# Patient Record
Sex: Male | Born: 1950
Health system: Southern US, Community
[De-identification: ages and names within clinical notes are randomized; demographics above are authoritative.]

## PROBLEM LIST (undated history)

## (undated) DIAGNOSIS — Z87891 Personal history of nicotine dependence: Secondary | ICD-10-CM

## (undated) DIAGNOSIS — Z8546 Personal history of malignant neoplasm of prostate: Secondary | ICD-10-CM

## (undated) DIAGNOSIS — J449 Chronic obstructive pulmonary disease, unspecified: Secondary | ICD-10-CM

## (undated) DIAGNOSIS — G4730 Sleep apnea, unspecified: Secondary | ICD-10-CM

## (undated) DIAGNOSIS — E785 Hyperlipidemia, unspecified: Secondary | ICD-10-CM

## (undated) DIAGNOSIS — I10 Essential (primary) hypertension: Secondary | ICD-10-CM

## (undated) DIAGNOSIS — R635 Abnormal weight gain: Secondary | ICD-10-CM

## (undated) DIAGNOSIS — C61 Malignant neoplasm of prostate: Secondary | ICD-10-CM

## (undated) DIAGNOSIS — M1990 Unspecified osteoarthritis, unspecified site: Secondary | ICD-10-CM

## (undated) DIAGNOSIS — E7800 Pure hypercholesterolemia, unspecified: Secondary | ICD-10-CM

## (undated) DIAGNOSIS — I129 Hypertensive chronic kidney disease with stage 1 through stage 4 chronic kidney disease, or unspecified chronic kidney disease: Secondary | ICD-10-CM

## (undated) HISTORY — DX: Essential (primary) hypertension: I10

## (undated) HISTORY — PX: PROSTATE SURGERY: SHX751

## (undated) HISTORY — DX: Unspecified osteoarthritis, unspecified site: M19.90

## (undated) HISTORY — DX: Abnormal weight gain: R63.5

## (undated) HISTORY — DX: Personal history of malignant neoplasm of prostate: Z85.46

## (undated) HISTORY — DX: Chronic obstructive pulmonary disease, unspecified: J44.9

## (undated) HISTORY — DX: Pure hypercholesterolemia, unspecified: E78.00

## (undated) HISTORY — DX: Malignant neoplasm of prostate: C61

## (undated) HISTORY — DX: Sleep apnea, unspecified: G47.30

## (undated) HISTORY — DX: Personal history of nicotine dependence: Z87.891

## (undated) HISTORY — DX: Hypertensive chronic kidney disease with stage 1 through stage 4 chronic kidney disease, or unspecified chronic kidney disease: I12.9

## (undated) HISTORY — DX: Hyperlipidemia, unspecified: E78.5

---

## 2004-12-11 ENCOUNTER — Ambulatory Visit: Payer: Self-pay | Admitting: Urology

## 2004-12-30 ENCOUNTER — Ambulatory Visit: Payer: Self-pay | Admitting: Oncology

## 2004-12-31 ENCOUNTER — Ambulatory Visit: Payer: Self-pay | Admitting: Urology

## 2005-01-13 ENCOUNTER — Ambulatory Visit: Payer: Self-pay | Admitting: Oncology

## 2005-02-13 ENCOUNTER — Ambulatory Visit: Payer: Self-pay | Admitting: Oncology

## 2005-03-15 ENCOUNTER — Ambulatory Visit: Payer: Self-pay | Admitting: Oncology

## 2005-04-15 ENCOUNTER — Ambulatory Visit: Payer: Self-pay | Admitting: Oncology

## 2005-07-09 ENCOUNTER — Ambulatory Visit: Payer: Self-pay | Admitting: Oncology

## 2005-07-16 ENCOUNTER — Ambulatory Visit: Payer: Self-pay | Admitting: Oncology

## 2005-11-06 ENCOUNTER — Ambulatory Visit: Payer: Self-pay | Admitting: Radiation Oncology

## 2005-11-14 ENCOUNTER — Ambulatory Visit: Payer: Self-pay | Admitting: Oncology

## 2006-01-05 ENCOUNTER — Ambulatory Visit: Payer: Self-pay | Admitting: Oncology

## 2006-07-07 ENCOUNTER — Ambulatory Visit: Payer: Self-pay | Admitting: Oncology

## 2006-11-12 ENCOUNTER — Ambulatory Visit: Payer: Self-pay | Admitting: Radiation Oncology

## 2006-11-17 IMAGING — NM NUCLEAR MEDICINE WHOLE BODY BONE SCINTIGRAPHY
2 series · 4 of 4 positions shown · non-contrast
Comparison: none

REASON FOR EXAM: prostate ca  S/P radical prostatectomy with rising CEA
NM at [DATE]
COMMENTS:

[Series 1: 3 hr wholebody · 2.40mm/px · 2 of 2 frames shown]
[frame 1/2]
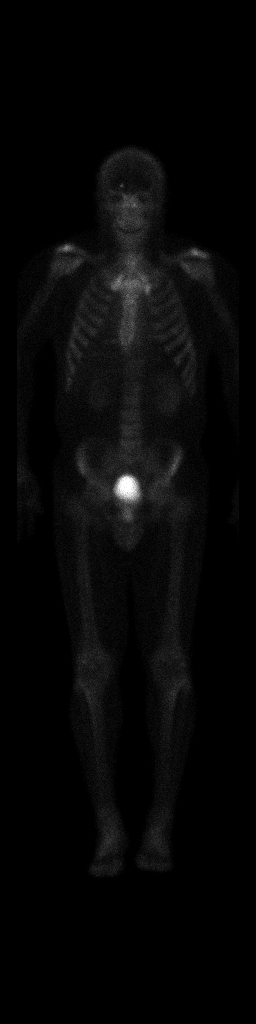
[frame 2/2]
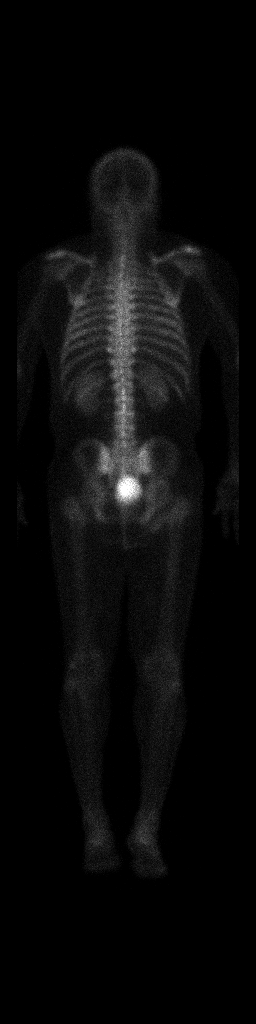

[Series 1: bone statics · 2.40mm/px · 2 of 2 frames shown]
[frame 1/2]
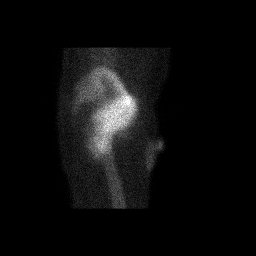
[frame 2/2]
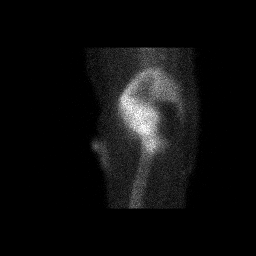

[4 of 4 positions shown; findings below may reference images not displayed]

PROCEDURE:     NM  - NM BONE WB 3 HR [DATE]  [DATE]

RESULT:       After injection of 22.39 mCi Tc 99m MDP, anterior and
posterior total body gamma scintiphotos were obtained and compared to a
prior study of 06/20/99.  The bone scan appears within normal limits as seen
on the previous study.  There is a tiny focal area of increased tracer noted
in the RIGHT frontal bone less apparent than on the previous exam.  Again
may be related to the frontal sinus.  No focal area of increase is noted to
suggest a metastatic lesion.
IMPRESSION: Bone Scan within normal limits.

## 2006-11-17 IMAGING — CT CT ABDOMEN AND PELVIS WITHOUT AND WITH CONTRAST
2 of 4 series · 13 of 32 positions shown, 18 images · non-contrast
Comparison: none

REASON FOR EXAM: prostate ca  S/P radical prostatectomy with rising CEA
NM at [DATE]
COMMENTS:

[Series 4: with · axial · 0.71mm/px · z∈[-486,-134]mm · 8 of 58 slices shown, 13 images]
[im 7/58  soft-tissue]
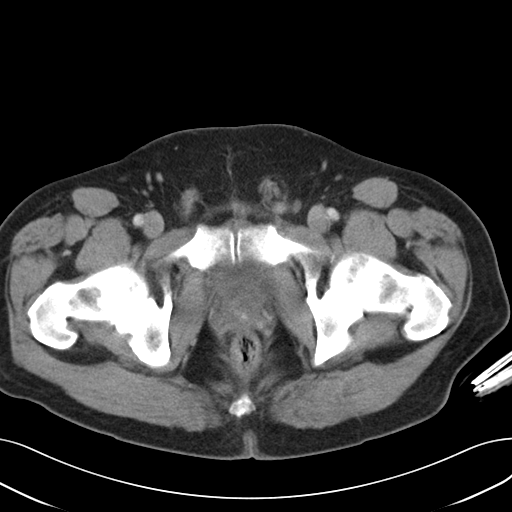
[im 7/58  bone]
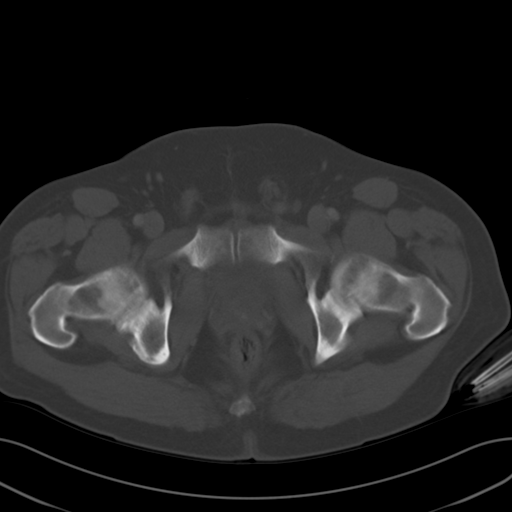
[im 13/58  soft-tissue]
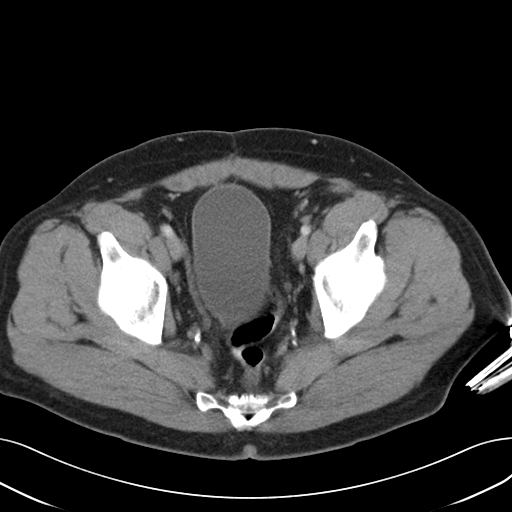
[im 20/58  soft-tissue]
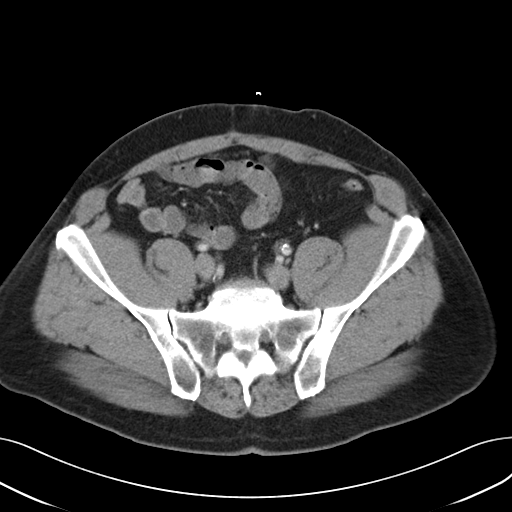
[im 26/58  soft-tissue]
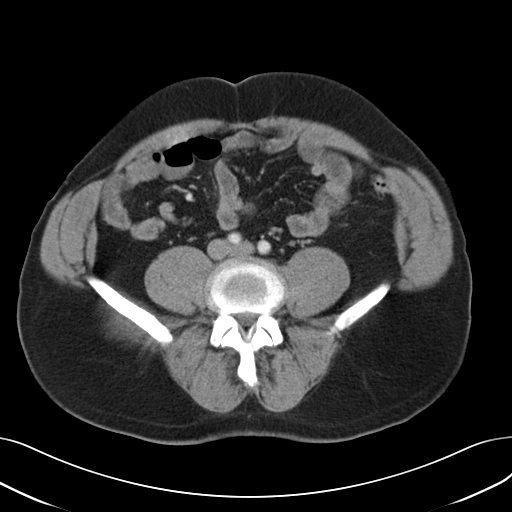
[im 32/58  soft-tissue]
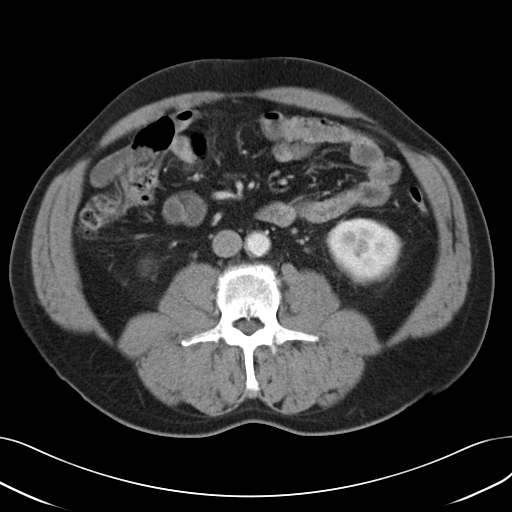
[im 32/58  lung]
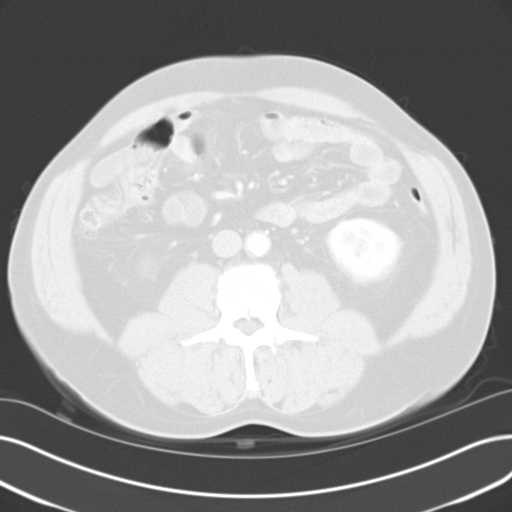
[im 39/58  soft-tissue]
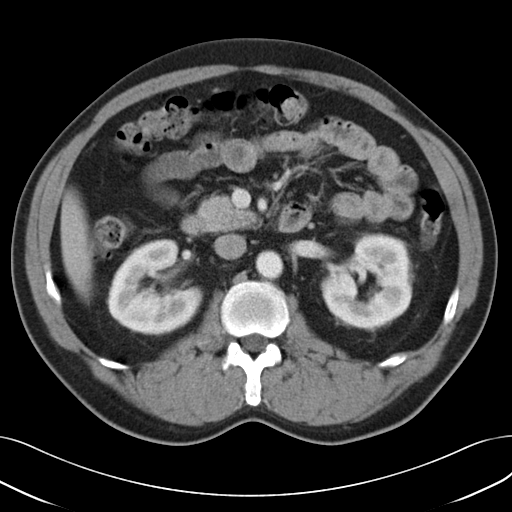
[im 39/58  lung]
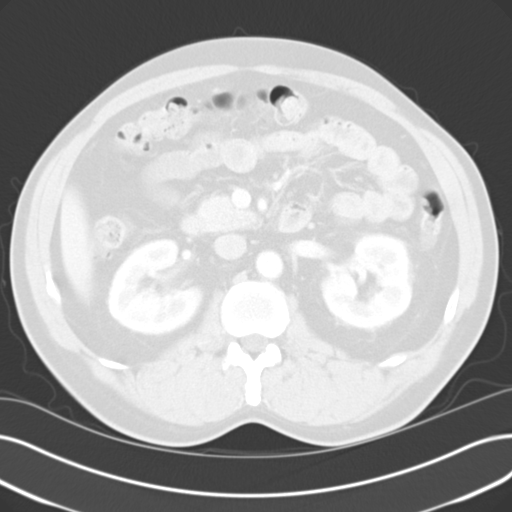
[im 45/58  soft-tissue]
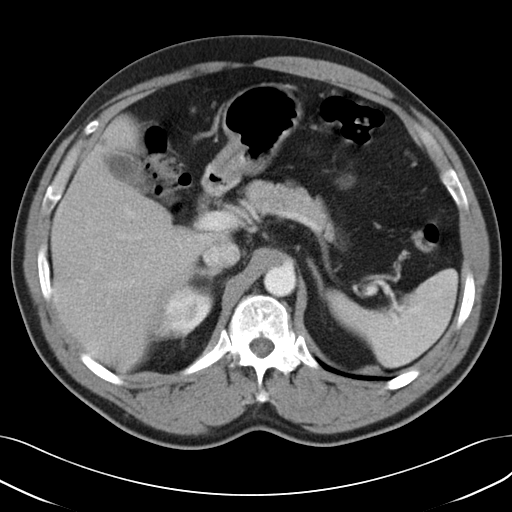
[im 45/58  lung]
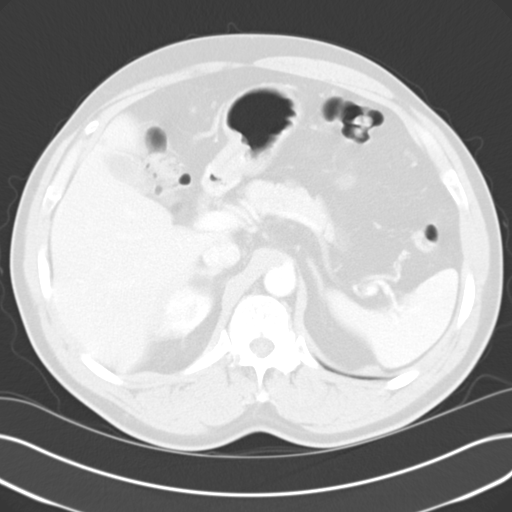
[im 51/58  soft-tissue]
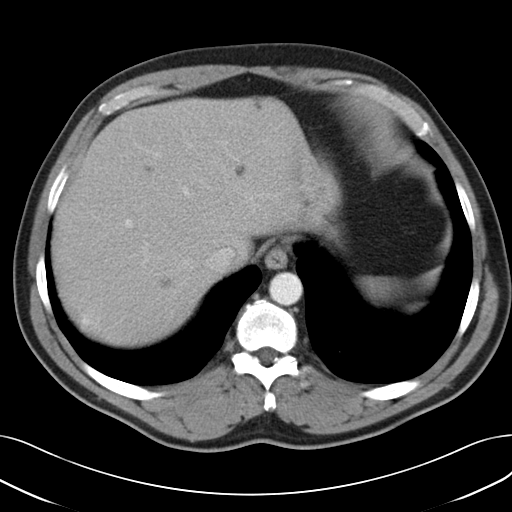
[im 51/58  lung]
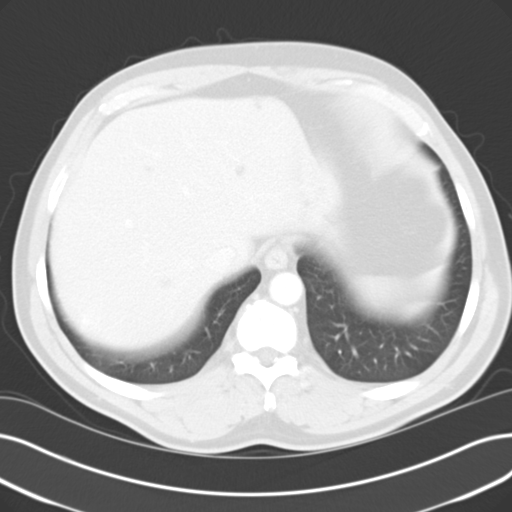

[Series 6: delay · axial · delayed · 0.71mm/px · z∈[-486,-286]mm · 5 of 58 slices shown]
[im 7/58  soft-tissue]
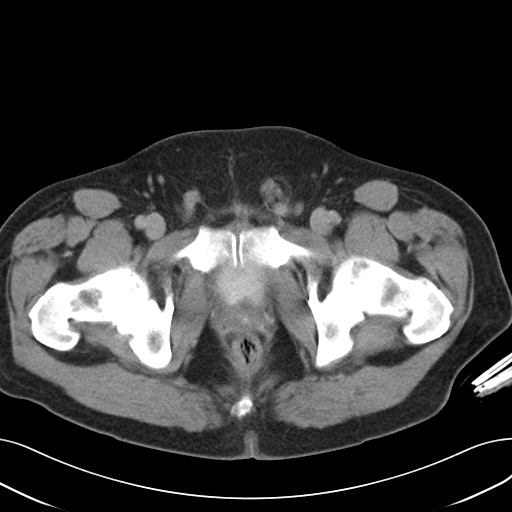
[im 13/58  soft-tissue]
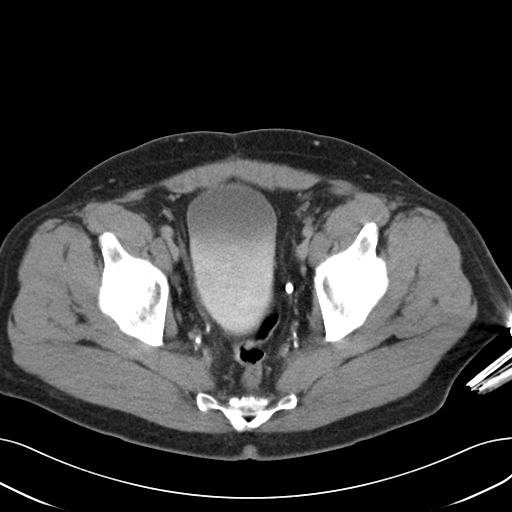
[im 20/58  soft-tissue]
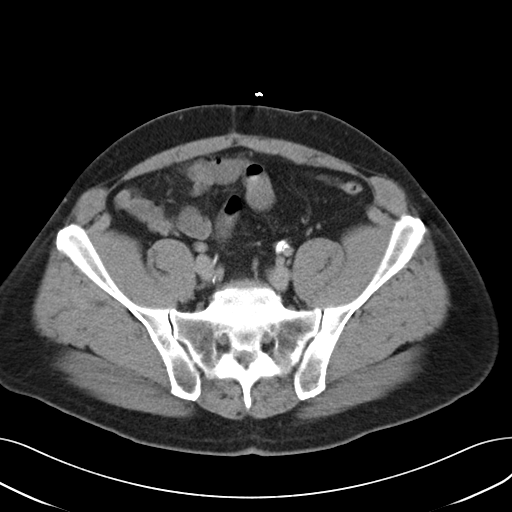
[im 26/58  soft-tissue]
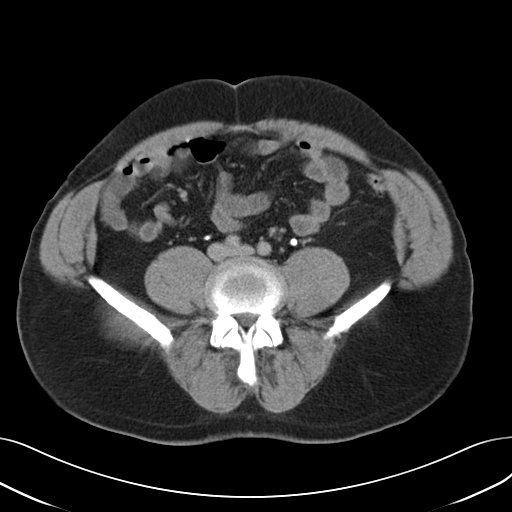
[im 32/58  soft-tissue]
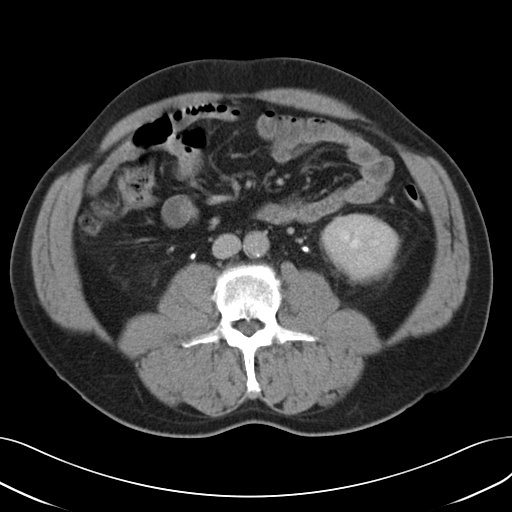

[13 of 32 positions shown; findings below may reference images not displayed]

PROCEDURE:     CT  - CT ABDOMEN / PELVIS  W/WO  - December 11, 2004  [DATE]

RESULT:     The patient has a history of radical prostatectomy, but has a
rising CEA value.

The non-contrast images reveal the LEFT kidney to be slightly larger than
the RIGHT and its interface with the perinephric fat is slightly blurred.
This may reflect low-grade inflammation or low-grade obstruction, but no
hydronephrosis is identified on these non-contrast images. On the subsequent
contrast enhanced images the enhancement pattern of the renal parenchyma is
normal and symmetric bilaterally. On the delayed images the appearance of
the contrast material within the intrarenal collecting systems appears
normal. There is abundant renal sinus fat.  The ureters are fractionally
visualized, but appear normal in course and caliber. The partially distended
urinary bladder is grossly normal. I see no free fluid in the pelvis nor
evidence of pelvic lymphadenopathy.  The sigmoid colon is grossly normal
though non-contrast filled.

The liver exhibits numerous tiny hypodensities on all three imaging
sequences. These measure 1 cm in diameter or less. The Hounsfield value of a
LEFT lobe lesion measures 8 HU prior to contrast rising to 35 on the
post-contrast image and measuring 25 on the delayed images.  This is not
classic for a simple cyst.  There is no intrahepatic ductal dilation.  The
non-distended stomach, spleen, adrenal glands, and pancreas are normal in
appearance. The abdominal aorta exhibits no evidence of an aneurysm. There
is calcification in its wall.  The unopacified loops of small and large
bowel exhibit no evidence of obstruction and no definite mass is identified.
IMPRESSION: 1)I do not see evidence of urinary tract lesions.

2)There are hypodensities noted in both the RIGHT and LEFT lobes of the
liver which may reflect cysts, but their Hounsfield values following
enhancement are not classic for that of simple cysts. The possibility of
metastatic disease is not excluded.

3)I do not see evidence of acute bowel abnormality on this study. If the
patient's PSA is rising then certainly the possibility of metastatic
prostate malignancy but be considered. Consideration for performing a
nuclear bone scan could be given.  Alternatively, if it is the patient's CEA
that has risen, then a gastrointestinal type malignancy is certainly high on
the differential list. The clinical history provided indicated that the CEA
was rising.

## 2007-01-14 ENCOUNTER — Ambulatory Visit: Payer: Self-pay | Admitting: Oncology

## 2007-01-28 ENCOUNTER — Ambulatory Visit: Payer: Self-pay | Admitting: Oncology

## 2007-02-03 ENCOUNTER — Ambulatory Visit: Payer: Self-pay | Admitting: Oncology

## 2007-02-14 ENCOUNTER — Ambulatory Visit: Payer: Self-pay | Admitting: Oncology

## 2007-09-07 ENCOUNTER — Other Ambulatory Visit: Payer: Self-pay

## 2007-09-07 ENCOUNTER — Emergency Department: Payer: Self-pay | Admitting: Emergency Medicine

## 2007-11-14 ENCOUNTER — Ambulatory Visit: Payer: Self-pay | Admitting: Radiation Oncology

## 2007-11-18 ENCOUNTER — Ambulatory Visit: Payer: Self-pay | Admitting: Radiation Oncology

## 2007-12-15 ENCOUNTER — Ambulatory Visit: Payer: Self-pay | Admitting: Radiation Oncology

## 2008-11-13 ENCOUNTER — Ambulatory Visit: Payer: Self-pay | Admitting: Radiation Oncology

## 2008-11-16 ENCOUNTER — Ambulatory Visit: Payer: Self-pay | Admitting: Radiation Oncology

## 2008-12-14 ENCOUNTER — Ambulatory Visit: Payer: Self-pay | Admitting: Radiation Oncology

## 2009-01-23 ENCOUNTER — Ambulatory Visit: Payer: Self-pay | Admitting: Gastroenterology

## 2009-11-13 ENCOUNTER — Ambulatory Visit: Payer: Self-pay | Admitting: Radiation Oncology

## 2009-11-15 ENCOUNTER — Ambulatory Visit: Payer: Self-pay | Admitting: Radiation Oncology

## 2009-12-14 ENCOUNTER — Ambulatory Visit: Payer: Self-pay | Admitting: Radiation Oncology

## 2009-12-20 ENCOUNTER — Encounter (HOSPITAL_COMMUNITY): Admission: RE | Admit: 2009-12-20 | Discharge: 2010-03-20 | Payer: Self-pay | Admitting: Urology

## 2012-05-31 DIAGNOSIS — Z8546 Personal history of malignant neoplasm of prostate: Secondary | ICD-10-CM

## 2012-05-31 DIAGNOSIS — R3129 Other microscopic hematuria: Secondary | ICD-10-CM | POA: Insufficient documentation

## 2012-05-31 DIAGNOSIS — N529 Male erectile dysfunction, unspecified: Secondary | ICD-10-CM | POA: Insufficient documentation

## 2012-05-31 DIAGNOSIS — C61 Malignant neoplasm of prostate: Secondary | ICD-10-CM | POA: Insufficient documentation

## 2012-05-31 DIAGNOSIS — N3040 Irradiation cystitis without hematuria: Secondary | ICD-10-CM | POA: Insufficient documentation

## 2012-05-31 HISTORY — DX: Personal history of malignant neoplasm of prostate: Z85.46

## 2012-06-23 DIAGNOSIS — N393 Stress incontinence (female) (male): Secondary | ICD-10-CM | POA: Insufficient documentation

## 2013-08-10 ENCOUNTER — Ambulatory Visit: Payer: Self-pay | Admitting: Family Medicine

## 2013-09-16 ENCOUNTER — Ambulatory Visit: Payer: Self-pay | Admitting: Vascular Surgery

## 2013-10-05 ENCOUNTER — Ambulatory Visit: Payer: Self-pay | Admitting: Unknown Physician Specialty

## 2013-11-03 ENCOUNTER — Ambulatory Visit: Payer: Self-pay | Admitting: Unknown Physician Specialty

## 2013-11-03 LAB — CBC WITH DIFFERENTIAL/PLATELET
BASOS PCT: 0.8 %
Basophil #: 0.1 10*3/uL (ref 0.0–0.1)
Eosinophil #: 0.2 10*3/uL (ref 0.0–0.7)
Eosinophil %: 1.7 %
HCT: 36.4 % — ABNORMAL LOW (ref 40.0–52.0)
HGB: 12.2 g/dL — AB (ref 13.0–18.0)
LYMPHS ABS: 2.9 10*3/uL (ref 1.0–3.6)
LYMPHS PCT: 28.1 %
MCH: 26 pg (ref 26.0–34.0)
MCHC: 33.6 g/dL (ref 32.0–36.0)
MCV: 78 fL — AB (ref 80–100)
MONO ABS: 0.8 x10 3/mm (ref 0.2–1.0)
Monocyte %: 8 %
NEUTROS PCT: 61.4 %
Neutrophil #: 6.3 10*3/uL (ref 1.4–6.5)
Platelet: 146 10*3/uL — ABNORMAL LOW (ref 150–440)
RBC: 4.69 10*6/uL (ref 4.40–5.90)
RDW: 18.7 % — AB (ref 11.5–14.5)
WBC: 10.3 10*3/uL (ref 3.8–10.6)

## 2013-11-08 ENCOUNTER — Ambulatory Visit: Payer: Self-pay | Admitting: Unknown Physician Specialty

## 2013-11-11 LAB — PATHOLOGY REPORT

## 2014-05-31 ENCOUNTER — Ambulatory Visit: Payer: Self-pay | Admitting: Unknown Physician Specialty

## 2015-01-06 NOTE — Op Note (Signed)
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   ATTENDING SURGEON: Roena Malady, MD   OPERATION PERFORMED: Direct laryngoscopy with biopsy of right and left tonsils, right and left tongue base and left vallecular lymphoid tissue.   OPERATIVE FINDINGS: Hypertrophied lymphoid tissue of the tonsils, the lingual tonsils and lymphoid tissue in the left vallecula.   DESCRIPTION OF PROCEDURE: [place] was identified in the holding area deny taken to the operating room and placed in the supine position. After general endotracheal anesthesia, the table was turned 90 degrees. Prior to the surgery, it was noted that there was a small laceration of the upper lip done during intubation. I did not feel that this needed any suture repair. A tooth gag was then placed over the upper teeth. A Dedo laryngoscope was introduced into the airway, and the airway was examined. There was hypertrophy of the tonsil tissue bilaterally as well as lymphoid tissue in the left vallecula which had been seen in the clinic as well as lymphoid hypertrophy of the tongue base. Using the large biopsy forceps, beginning on the left-hand side, the most suspicious tissue was the lymphoid tissue in the left vallecula. Multiple biopsies were taken of this and sent fresh for lymphoma workup. With biopsies there completed, a cottonoid pledget with phenylephrine and lidocaine was placed over this area for hemostasis and anesthesia. Biopsies were then taken of the tongue base bilaterally as well as biopsies of the tonsils bilaterally. Again, cottonoid pledgets with phenylephrine and lidocaine were used in these areas for hemostasis and anesthesia. With these biopsies completed, re-examination of the  epiglottis was otherwise unremarkable. The remainder of the pharyngeal exam was unremarkable. The patient was then returned to anesthesia, where he was awakened in the operating room and taken to the recovery room in stable condition.   CULTURES: None.   SPECIMENS: Right and left tonsils, right and left tongue base, left follicular lymphoid tissue.   ____________________________ Roena Malady, MD ctm:lb D: 11/08/2013 11:19:31 ET T: 11/08/2013 11:43:37 ET JOB#: [id number]  cc: Roena Malady, MD, <Dictator> Roena Malady MD ELECTRONICALLY SIGNED 12/13/2013 18:02

## 2015-03-14 ENCOUNTER — Encounter: Payer: Self-pay | Admitting: Family Medicine

## 2015-04-06 ENCOUNTER — Other Ambulatory Visit: Payer: Self-pay | Admitting: Emergency Medicine

## 2015-04-06 MED ORDER — TIOTROPIUM BROMIDE MONOHYDRATE 18 MCG IN CAPS: 18.0000 ug | ORAL_CAPSULE | Freq: Once | RESPIRATORY_TRACT | Status: DC

## 2015-04-10 ENCOUNTER — Ambulatory Visit (INDEPENDENT_AMBULATORY_CARE_PROVIDER_SITE_OTHER): Payer: BLUE CROSS/BLUE SHIELD | Admitting: Family Medicine

## 2015-04-10 ENCOUNTER — Encounter: Payer: Self-pay | Admitting: Family Medicine

## 2015-04-10 VITALS — BP 140/62 | HR 100 | Temp 98.0°F | Resp 18 | Ht 67.0 in | Wt 222.1 lb

## 2015-04-10 DIAGNOSIS — Z0000 Encounter for general adult medical examination without abnormal findings: Secondary | ICD-10-CM

## 2015-04-10 NOTE — Progress Notes (Signed)
Name: Todd Hutchinson   MRN: [id number]    DOB: Aug 09, 1951   Date:04/10/2015       Progress Note  Subjective  Chief Complaint  Chief Complaint  Patient presents with  . Annual Exam    HPI  64 year old male presents for annual H&P. He continues to be followed by his urologist for prostate cancer. His COPD has been stable as have his other medical problems  Past Medical History  Diagnosis Date  . Hyperlipidemia   . Hypertension   . COPD (chronic obstructive pulmonary disease)   . Prostate cancer     History  Substance Use Topics  . Smoking status: Former Research scientist (life sciences)  . Smokeless tobacco: Not on file  . Alcohol Use: 0.0 oz/week    0 Standard drinks or equivalent per week     Current outpatient prescriptions:  .  albuterol (PROAIR HFA) 108 (90 BASE) MCG/ACT inhaler, , Disp: , Rfl:  .  amLODipine-benazepril (LOTREL) 10-40 MG per capsule, , Disp: , Rfl:  .  chlorthalidone (HYGROTON) 25 MG tablet, , Disp: , Rfl:  .  fenofibrate (TRICOR) 145 MG tablet, Take 145 mg by mouth., Disp: , Rfl:  .  pravastatin (PRAVACHOL) 40 MG tablet, Take 40 mg by mouth., Disp: , Rfl:  .  tiotropium (SPIRIVA HANDIHALER) 18 MCG inhalation capsule, Place 1 capsule (18 mcg total) into inhaler and inhale once., Disp: 90 capsule, Rfl: 1  Allergies  Allergen Reactions  . Atorvastatin Rash    Review of Systems  Constitutional: Negative for fever, chills and weight loss.  HENT: Negative for congestion, hearing loss, sore throat and tinnitus.   Eyes: Negative for blurred vision, double vision and redness.  Respiratory: Negative for cough, hemoptysis and shortness of breath.   Cardiovascular: Negative for chest pain, palpitations, orthopnea, claudication and leg swelling.  Gastrointestinal: Negative for heartburn, nausea, vomiting, diarrhea, constipation and blood in stool.  Genitourinary: Negative for dysuria, urgency, frequency and hematuria.       Prostate cancer with voiding issues and EGD followed  by urologist  Musculoskeletal: Negative for myalgias, back pain, joint pain, falls and neck pain.  Skin: Negative for itching.  Neurological: Negative for dizziness, tingling, tremors, focal weakness, seizures, loss of consciousness, weakness and headaches.  Endo/Heme/Allergies: Does not bruise/bleed easily.  Psychiatric/Behavioral: Negative for depression and substance abuse. The patient is not nervous/anxious and does not have insomnia.      Objective  Filed Vitals:   04/10/15 1146  BP: 140/62  Pulse: 100  Temp: 98 F (36.7 C)  TempSrc: Oral  Resp: 18  Height: 5\' 7"  (1.702 m)  Weight: 222 lb 1.6 oz (100.744 kg)  SpO2: 98%     Physical Exam  Constitutional: He is oriented to person, place, and time and well-developed, well-nourished, and in no distress.  HENT:  Head: Normocephalic.  Eyes: EOM are normal. Pupils are equal, round, and reactive to light.  Neck: Normal range of motion. Neck supple. No thyromegaly present.  Cardiovascular: Normal rate, regular rhythm and normal heart sounds.   No murmur heard. Pulmonary/Chest: Effort normal and breath sounds normal. No respiratory distress. He has no wheezes.  Abdominal: Soft. Bowel sounds are normal.  Genitourinary:  Per urologist  Musculoskeletal: Normal range of motion. He exhibits no edema.  Lymphadenopathy:    He has no cervical adenopathy.  Neurological: He is alert and oriented to person, place, and time. No cranial nerve deficit. Gait normal. Coordination normal.  Skin: Skin is warm and dry. No rash noted.  Psychiatric: Affect and judgment normal.      Assessment & Plan  1. Annual physical exam  - CBC - Comprehensive metabolic panel - Lipid panel - TSH

## 2015-04-10 NOTE — Patient Instructions (Signed)

## 2015-04-19 LAB — COMPREHENSIVE METABOLIC PANEL
A/G RATIO: 1.6 (ref 1.1–2.5)
ALT: 26 IU/L (ref 0–44)
AST: 15 IU/L (ref 0–40)
Albumin: 4.1 g/dL (ref 3.6–4.8)
Alkaline Phosphatase: 24 IU/L — ABNORMAL LOW (ref 39–117)
BILIRUBIN TOTAL: 0.2 mg/dL (ref 0.0–1.2)
BUN / CREAT RATIO: 17 (ref 10–22)
BUN: 25 mg/dL (ref 8–27)
CALCIUM: 10.2 mg/dL (ref 8.6–10.2)
CHLORIDE: 103 mmol/L (ref 97–108)
CO2: 22 mmol/L (ref 18–29)
Creatinine, Ser: 1.5 mg/dL — ABNORMAL HIGH (ref 0.76–1.27)
GFR calc non Af Amer: 49 mL/min/{1.73_m2} — ABNORMAL LOW (ref >59)
GFR, EST AFRICAN AMERICAN: 56 mL/min/{1.73_m2} — AB (ref >59)
Globulin, Total: 2.6 g/dL (ref 1.5–4.5)
Glucose: 97 mg/dL (ref 65–99)
Potassium: 4.3 mmol/L (ref 3.5–5.2)
Sodium: 143 mmol/L (ref 134–144)
TOTAL PROTEIN: 6.7 g/dL (ref 6.0–8.5)

## 2015-04-19 LAB — TSH: TSH: 1.24 u[IU]/mL (ref 0.450–4.500)

## 2015-04-19 LAB — LIPID PANEL
Chol/HDL Ratio: 3 ratio units (ref 0.0–5.0)
Cholesterol, Total: 139 mg/dL (ref 100–199)
HDL: 46 mg/dL (ref >39)
LDL Calculated: 72 mg/dL (ref 0–99)
TRIGLYCERIDES: 106 mg/dL (ref 0–149)
VLDL Cholesterol Cal: 21 mg/dL (ref 5–40)

## 2015-04-19 LAB — CBC
Hematocrit: 33 % — ABNORMAL LOW (ref 37.5–51.0)
Hemoglobin: 10.8 g/dL — ABNORMAL LOW (ref 12.6–17.7)
MCH: 25.6 pg — AB (ref 26.6–33.0)
MCHC: 32.7 g/dL (ref 31.5–35.7)
MCV: 78 fL — ABNORMAL LOW (ref 79–97)
PLATELETS: 227 10*3/uL (ref 150–379)
RBC: 4.22 x10E6/uL (ref 4.14–5.80)
RDW: 16.1 % — AB (ref 12.3–15.4)
WBC: 6.1 10*3/uL (ref 3.4–10.8)

## 2015-04-27 ENCOUNTER — Telehealth: Payer: Self-pay | Admitting: Emergency Medicine

## 2015-04-27 NOTE — Telephone Encounter (Signed)
Patient notified

## 2015-05-14 ENCOUNTER — Other Ambulatory Visit: Payer: Self-pay

## 2015-05-14 MED ORDER — PRAVASTATIN SODIUM 40 MG PO TABS: 40.0000 mg | ORAL_TABLET | Freq: Every day | ORAL | Status: DC

## 2015-05-17 ENCOUNTER — Other Ambulatory Visit: Payer: Self-pay | Admitting: Family Medicine

## 2015-06-05 ENCOUNTER — Other Ambulatory Visit: Payer: Self-pay | Admitting: Family Medicine

## 2015-06-18 ENCOUNTER — Encounter: Payer: Self-pay | Admitting: Family Medicine

## 2015-06-18 ENCOUNTER — Ambulatory Visit (INDEPENDENT_AMBULATORY_CARE_PROVIDER_SITE_OTHER): Payer: BLUE CROSS/BLUE SHIELD | Admitting: Family Medicine

## 2015-06-18 ENCOUNTER — Other Ambulatory Visit: Payer: Self-pay | Admitting: Family Medicine

## 2015-06-18 VITALS — BP 128/62 | HR 96 | Temp 98.6°F | Resp 16 | Ht 67.0 in | Wt 220.2 lb

## 2015-06-18 DIAGNOSIS — J449 Chronic obstructive pulmonary disease, unspecified: Secondary | ICD-10-CM

## 2015-06-18 DIAGNOSIS — I10 Essential (primary) hypertension: Secondary | ICD-10-CM

## 2015-06-18 DIAGNOSIS — E785 Hyperlipidemia, unspecified: Secondary | ICD-10-CM

## 2015-06-18 DIAGNOSIS — Z23 Encounter for immunization: Secondary | ICD-10-CM

## 2015-06-18 NOTE — Progress Notes (Signed)
Name: Todd Hutchinson   MRN: [id number]    DOB: July 15, 1951   Date:06/18/2015       Progress Note  Subjective  Chief Complaint  Chief Complaint  Patient presents with  . COPD    Pt here for 4 month follow up  . Hyperlipidemia  . Hypertension    HPI  Hypertension   Patient presents for follow-up of hypertension. It has been present for over over 5 years.  Patient states that there is compliance with medical regimen which consists of hypertension once daily and amlodipine with benazepril once daily . There is no end organ disease. Cardiac risk factors include hypertension hyperlipidemia and diabetes.  Exercise regimen consist of walking.   Diet consist of low-sodium .  Hyperlipidemia  Patient has a history of hyperlipidemia for over 5 years.  Current medical regimen consist of pravastatin 40 mg daily at bedtime .  Compliance is good .  Diet and exercise are currently followed low-fat .  Risk factors for cardiovascular disease include hyperlipidemia , hypertension .   There have been no side effects from the medication.    COPD history of present illness  Currently no problem with cough congestion. He discontinue smoking several years ago. Restarted. Is currently on Spiriva 18 mg inhaled once daily and pro-air HFA when necessary basis which is not required usage of recent.  Past Medical History  Diagnosis Date  . Hyperlipidemia   . Hypertension   . COPD (chronic obstructive pulmonary disease) ([place])   . Prostate cancer [facility])     Social History  Substance Use Topics  . Smoking status: Former Research scientist (life sciences)  . Smokeless tobacco: Not on file  . Alcohol Use: 0.0 oz/week    0 Standard drinks or equivalent per week     Current outpatient prescriptions:  .  albuterol (PROAIR HFA) 108 (90 BASE) MCG/ACT inhaler, , Disp: , Rfl:  .  amLODipine-benazepril (LOTREL) 10-40 MG per capsule, TAKE 1 CAPSULE DAILY, Disp: 90 capsule, Rfl: 0 .  chlorthalidone (HYGROTON) 25 MG tablet, TAKE 1 TABLET  DAILY, Disp: 90 tablet, Rfl: 0 .  fenofibrate (TRICOR) 145 MG tablet, Take 145 mg by mouth., Disp: , Rfl:  .  pravastatin (PRAVACHOL) 40 MG tablet, Take 1 tablet (40 mg total) by mouth daily., Disp: 90 tablet, Rfl: 1 .  tiotropium (SPIRIVA HANDIHALER) 18 MCG inhalation capsule, Place 1 capsule (18 mcg total) into inhaler and inhale once., Disp: 90 capsule, Rfl: 1  Allergies  Allergen Reactions  . Atorvastatin Rash    Review of Systems  Constitutional: Negative for fever, chills and weight loss.  HENT: Negative for congestion, hearing loss, sore throat and tinnitus.   Eyes: Negative for blurred vision, double vision and redness.  Respiratory: Negative for cough, hemoptysis and shortness of breath.   Cardiovascular: Negative for chest pain, palpitations, orthopnea, claudication and leg swelling.  Gastrointestinal: Negative for heartburn, nausea, vomiting, diarrhea, constipation and blood in stool.  Genitourinary: Negative for dysuria, urgency, frequency and hematuria.  Musculoskeletal: Negative for myalgias, back pain, joint pain, falls and neck pain.  Skin: Negative for itching.  Neurological: Negative for dizziness, tingling, tremors, focal weakness, seizures, loss of consciousness, weakness and headaches.  Endo/Heme/Allergies: Does not bruise/bleed easily.  Psychiatric/Behavioral: Negative for depression and substance abuse. The patient is not nervous/anxious and does not have insomnia.      Objective  Filed Vitals:   06/18/15 0813  BP: 128/62  Pulse: 96  Temp: 98.6 F (37 C)  Resp: 16  Height:  5\' 7"  (1.702 m)  Weight: 220 lb 3 oz (99.876 kg)  SpO2: 97%     Physical Exam  Constitutional: He is oriented to person, place, and time and well-developed, well-nourished, and in no distress.  HENT:  Head: Normocephalic.  Eyes: EOM are normal. Pupils are equal, round, and reactive to light.  Neck: Normal range of motion. Neck supple. No thyromegaly present.  Cardiovascular:  Normal rate, regular rhythm and normal heart sounds.   No murmur heard. Pulmonary/Chest: Effort normal and breath sounds normal. No respiratory distress. He has no wheezes.  Abdominal: Soft. Bowel sounds are normal.  Musculoskeletal: Normal range of motion. He exhibits no edema.  Lymphadenopathy:    He has no cervical adenopathy.  Neurological: He is alert and oriented to person, place, and time. No cranial nerve deficit. Gait normal. Coordination normal.  Skin: Skin is warm and dry. No rash noted.  Psychiatric: Affect and judgment normal.      Assessment & Plan   1. Essential hypertension Well-controlled pacemaker result well-controlled - Comprehensive Metabolic Panel (CMET)  2. Hyperlipemia  - Comprehensive Metabolic Panel (CMET)  3. Chronic obstructive pulmonary disease, unspecified COPD type (HCC) Stable - Comprehensive Metabolic Panel (CMET)  4. Need for influenza vaccination Given  5. Need for pneumococcal vaccination Givenl     History

## 2015-06-19 LAB — COMPREHENSIVE METABOLIC PANEL
A/G RATIO: 2 (ref 1.1–2.5)
ALK PHOS: 24 IU/L — AB (ref 39–117)
ALT: 25 IU/L (ref 0–44)
AST: 17 IU/L (ref 0–40)
Albumin: 4.7 g/dL (ref 3.6–4.8)
BILIRUBIN TOTAL: 0.3 mg/dL (ref 0.0–1.2)
BUN/Creatinine Ratio: 17 (ref 10–22)
BUN: 27 mg/dL (ref 8–27)
CHLORIDE: 102 mmol/L (ref 97–108)
CO2: 22 mmol/L (ref 18–29)
Calcium: 10.5 mg/dL — ABNORMAL HIGH (ref 8.6–10.2)
Creatinine, Ser: 1.6 mg/dL — ABNORMAL HIGH (ref 0.76–1.27)
GFR calc non Af Amer: 45 mL/min/{1.73_m2} — ABNORMAL LOW (ref >59)
GFR, EST AFRICAN AMERICAN: 52 mL/min/{1.73_m2} — AB (ref >59)
GLUCOSE: 106 mg/dL — AB (ref 65–99)
Globulin, Total: 2.4 g/dL (ref 1.5–4.5)
POTASSIUM: 4.2 mmol/L (ref 3.5–5.2)
Sodium: 142 mmol/L (ref 134–144)
TOTAL PROTEIN: 7.1 g/dL (ref 6.0–8.5)

## 2015-06-26 ENCOUNTER — Telehealth: Payer: Self-pay | Admitting: Emergency Medicine

## 2015-06-26 DIAGNOSIS — N182 Chronic kidney disease, stage 2 (mild): Secondary | ICD-10-CM

## 2015-06-26 NOTE — Telephone Encounter (Signed)
Patient will stop taking Chlorthiladone 25 mg. Do he need to take anything in place of this med.

## 2015-06-28 NOTE — Telephone Encounter (Signed)
No Return to office in 1 month to recheck blood pressure

## 2015-06-29 ENCOUNTER — Telehealth: Payer: Self-pay | Admitting: Emergency Medicine

## 2015-06-29 NOTE — Telephone Encounter (Signed)
Spoke to patient. Will call office back to make appointment for 1 month

## 2015-07-10 ENCOUNTER — Encounter: Payer: Self-pay | Admitting: Family Medicine

## 2015-07-10 ENCOUNTER — Ambulatory Visit (INDEPENDENT_AMBULATORY_CARE_PROVIDER_SITE_OTHER): Payer: BLUE CROSS/BLUE SHIELD | Admitting: Family Medicine

## 2015-07-10 VITALS — BP 142/68 | HR 94 | Temp 98.0°F | Resp 18 | Ht 67.0 in | Wt 226.8 lb

## 2015-07-10 DIAGNOSIS — I10 Essential (primary) hypertension: Secondary | ICD-10-CM

## 2015-07-10 DIAGNOSIS — D6489 Other specified anemias: Secondary | ICD-10-CM | POA: Diagnosis not present

## 2015-07-10 DIAGNOSIS — N183 Chronic kidney disease, stage 3 unspecified: Secondary | ICD-10-CM

## 2015-07-10 NOTE — Progress Notes (Signed)
Name: Todd Hutchinson   MRN: [id number]    DOB: Sep 05, 1951   Date:07/10/2015       Progress Note  Subjective  Chief Complaint  Chief Complaint  Patient presents with  . Hypertension    Chlorthiladone was stopped due to abnormal labs    HPI  Hypertension   Patient presents for follow-up of hypertension. It has been present for over 5 years.  Patient states that there is compliance with medical regimen which consists of Lotrel 10-40 once daily. Chlorthalidone was recently discontinued because of decreased EGFR . There is no end organ disease. Cardiac risk factors include hypertension hyperlipidemia and diabetes.  Exercise regimen consist of some walking .  Diet consist of some salt restriction  Stage III chronic kidney disease  Patient's EGFR was decreased to 52. He states he was seen by his nephrologist this a.m this back up to 56 again after having discontinued chlorthalidone. He was also noted to be slightly anemic at that time he states.  Anemia  Patient was noted by nephrologist to be anemic this a.m. We are unable to access his notes from epic as yet. The patient is not consuming any NSAIDs and he is off of diuretics currently he is on an ACE inhibitor which has renal protection .  Past Medical History  Diagnosis Date  . Hyperlipidemia   . Hypertension   . COPD (chronic obstructive pulmonary disease) ([place])   . Prostate cancer [facility])     Social History  Substance Use Topics  . Smoking status: Former Research scientist (life sciences)  . Smokeless tobacco: Never Used  . Alcohol Use: 0.0 oz/week    0 Standard drinks or equivalent per week     Current outpatient prescriptions:  .  albuterol (PROAIR HFA) 108 (90 BASE) MCG/ACT inhaler, , Disp: , Rfl:  .  amLODipine-benazepril (LOTREL) 10-40 MG per capsule, TAKE 1 CAPSULE DAILY, Disp: 90 capsule, Rfl: 0 .  fenofibrate (TRICOR) 145 MG tablet, TAKE 1 TABLET DAILY, Disp: 90 tablet, Rfl: 3 .  pravastatin (PRAVACHOL) 40 MG tablet, Take 1 tablet (40  mg total) by mouth daily., Disp: 90 tablet, Rfl: 1 .  tiotropium (SPIRIVA HANDIHALER) 18 MCG inhalation capsule, Place 1 capsule (18 mcg total) into inhaler and inhale once., Disp: 90 capsule, Rfl: 1  Allergies  Allergen Reactions  . Atorvastatin Rash    Review of Systems  Constitutional: Negative for fever, chills and weight loss.  HENT: Negative for congestion, hearing loss, sore throat and tinnitus.   Eyes: Negative for blurred vision, double vision and redness.  Respiratory: Negative for cough, hemoptysis and shortness of breath.   Cardiovascular: Negative for chest pain, palpitations, orthopnea, claudication and leg swelling.  Gastrointestinal: Negative for heartburn, nausea, vomiting, diarrhea, constipation and blood in stool.  Genitourinary: Negative for dysuria, urgency, frequency and hematuria.  Musculoskeletal: Negative for myalgias, back pain, joint pain, falls and neck pain.  Skin: Negative for itching.  Neurological: Negative for dizziness, tingling, tremors, focal weakness, seizures, loss of consciousness, weakness and headaches.  Endo/Heme/Allergies: Does not bruise/bleed easily.  Psychiatric/Behavioral: Negative for depression and substance abuse. The patient is not nervous/anxious and does not have insomnia.      Objective  Filed Vitals:   07/10/15 1206  BP: 142/68  Pulse: 94  Temp: 98 F (36.7 C)  TempSrc: Oral  Resp: 18  Height: 5' 7" (1.702 m)  Weight: 226 lb 12.8 oz (102.876 kg)  SpO2: 95%     Physical Exam  Constitutional: He is oriented to  person, place, and time and well-developed, well-nourished, and in no distress.  HENT:  Head: Normocephalic.  Eyes: EOM are normal. Pupils are equal, round, and reactive to light.  Neck: Normal range of motion. Neck supple. No thyromegaly present.  Cardiovascular: Normal rate, regular rhythm and normal heart sounds.   No murmur heard. Pulmonary/Chest: No respiratory distress. He has no wheezes.  Diminished  breath sounds  Abdominal: Soft. Bowel sounds are normal.  Musculoskeletal: Normal range of motion. He exhibits no edema.  Lymphadenopathy:    He has no cervical adenopathy.  Neurological: He is alert and oriented to person, place, and time. No cranial nerve deficit. Gait normal. Coordination normal.  Skin: Skin is warm and dry. No rash noted.  Psychiatric: Affect and judgment normal.      Assessment & Plan  1. Essential hypertension Goal of 130/80 or less. Patient states it is always below this been checked at home is always slightly elevated. Alkaline phosphatase  2. Chronic kidney disease, stage III (moderate) Primary nephrologist  3. Anemia due to other cause Probably secondary to renal disease. And this is not confirmed by the nephrologist workup and consider workup for other causes of anemia.

## 2015-07-11 ENCOUNTER — Ambulatory Visit: Payer: BLUE CROSS/BLUE SHIELD | Admitting: Family Medicine

## 2015-08-13 ENCOUNTER — Other Ambulatory Visit: Payer: Self-pay | Admitting: Family Medicine

## 2015-09-14 ENCOUNTER — Other Ambulatory Visit: Payer: Self-pay | Admitting: Family Medicine

## 2015-10-18 ENCOUNTER — Ambulatory Visit: Payer: BLUE CROSS/BLUE SHIELD | Admitting: Family Medicine

## 2015-11-09 ENCOUNTER — Other Ambulatory Visit: Payer: Self-pay | Admitting: Family Medicine

## 2015-11-14 ENCOUNTER — Ambulatory Visit: Payer: BLUE CROSS/BLUE SHIELD | Admitting: Family Medicine

## 2015-12-19 ENCOUNTER — Other Ambulatory Visit: Payer: Self-pay | Admitting: Family Medicine

## 2016-01-07 ENCOUNTER — Other Ambulatory Visit: Payer: Self-pay

## 2016-01-07 MED ORDER — FENOFIBRATE MICRONIZED 67 MG PO CAPS: 67.0000 mg | ORAL_CAPSULE | Freq: Every day | ORAL | Status: DC

## 2016-01-14 ENCOUNTER — Other Ambulatory Visit: Payer: Self-pay | Admitting: Emergency Medicine

## 2016-01-14 MED ORDER — FENOFIBRATE 145 MG PO TABS: 145.0000 mg | ORAL_TABLET | Freq: Every day | ORAL | Status: DC

## 2016-01-15 ENCOUNTER — Ambulatory Visit: Payer: BLUE CROSS/BLUE SHIELD | Admitting: Family Medicine

## 2016-02-05 ENCOUNTER — Other Ambulatory Visit: Payer: Self-pay | Admitting: Family Medicine

## 2016-03-13 ENCOUNTER — Encounter: Payer: Self-pay | Admitting: Family Medicine

## 2016-03-13 ENCOUNTER — Ambulatory Visit (INDEPENDENT_AMBULATORY_CARE_PROVIDER_SITE_OTHER): Payer: BLUE CROSS/BLUE SHIELD | Admitting: Family Medicine

## 2016-03-13 VITALS — BP 162/70 | HR 82 | Temp 98.3°F | Resp 16 | Wt 232.0 lb

## 2016-03-13 DIAGNOSIS — C61 Malignant neoplasm of prostate: Secondary | ICD-10-CM | POA: Diagnosis not present

## 2016-03-13 DIAGNOSIS — N183 Chronic kidney disease, stage 3 unspecified: Secondary | ICD-10-CM

## 2016-03-13 DIAGNOSIS — Z5181 Encounter for therapeutic drug level monitoring: Secondary | ICD-10-CM

## 2016-03-13 DIAGNOSIS — E785 Hyperlipidemia, unspecified: Secondary | ICD-10-CM | POA: Insufficient documentation

## 2016-03-13 DIAGNOSIS — E7800 Pure hypercholesterolemia, unspecified: Secondary | ICD-10-CM

## 2016-03-13 DIAGNOSIS — R635 Abnormal weight gain: Secondary | ICD-10-CM | POA: Diagnosis not present

## 2016-03-13 DIAGNOSIS — I10 Essential (primary) hypertension: Secondary | ICD-10-CM

## 2016-03-13 HISTORY — DX: Abnormal weight gain: R63.5

## 2016-03-13 HISTORY — DX: Pure hypercholesterolemia, unspecified: E78.00

## 2016-03-13 MED ORDER — METOPROLOL SUCCINATE ER 25 MG PO TB24: 25.0000 mg | ORAL_TABLET | Freq: Every day | ORAL | Status: DC

## 2016-03-13 NOTE — Progress Notes (Signed)
BP 162/70 mmHg  Pulse 82  Temp(Src) 98.3 F (36.8 C) (Oral)  Resp 16  Wt 232 lb (105.235 kg)  SpO2 97%   Subjective:    Patient ID: Todd Hutchinson, male    DOB: 1951/03/17, 65 y.o.   MRN: [id number]  HPI: Todd Hutchinson is a 65 y.o. male  Chief Complaint  Patient presents with  . Follow-up   Patient is new to me; his usual provider is out of the office  Hypertension; at home it's in 140's; no low BPs; he is on amlodipine He used to be on chlorthalidone but that messed with his kidney No added salt; wife does cook with a little salt; she watches salt No allergy or decongestants Does eat some processed meat  High cholesterol; no hx of really high TG; on two medicines right now  Obesity; gained weight after quitting smoking; 2+ years after; gaining weight since then; 12 pounds since October; working with health coach at work; virtually no sweets; drinking enough water; not eating late; portions vary  Prostate cancer; taking lupron shots; diagnosed at age 80  Depression screen PHQ 2/9 03/13/2016 07/10/2015 06/18/2015 04/10/2015  Decreased Interest 0 0 0 0  Down, Depressed, Hopeless 0 0 0 0  PHQ - 2 Score 0 0 0 0   Relevant past medical, surgical, family and social history reviewed Past Medical History  Diagnosis Date  . Hyperlipidemia   . Hypertension   . COPD (chronic obstructive pulmonary disease) ([place])   . Prostate cancer ([place])   . Abnormal weight gain 03/13/2016  . Hypercholesterolemia 03/13/2016   Past Surgical History  Procedure Laterality Date  . Prostate surgery     Family History  Problem Relation Age of Onset  . Hypertension Mother   . Hypertension Father   . Hypertension Sister   . Hypertension Brother    Social History  Substance Use Topics  . Smoking status: Former Research scientist (life sciences)  . Smokeless tobacco: Never Used  . Alcohol Use: 0.0 oz/week    0 Standard drinks or equivalent per week   Interim medical history since last visit  reviewed. Allergies and medications reviewed  Review of Systems Per HPI unless specifically indicated above     Objective:    BP 162/70 mmHg  Pulse 82  Temp(Src) 98.3 F (36.8 C) (Oral)  Resp 16  Wt 232 lb (105.235 kg)  SpO2 97%  Wt Readings from Last 3 Encounters:  03/13/16 232 lb (105.235 kg)  07/10/15 226 lb 12.8 oz (102.876 kg)  06/18/15 220 lb 3 oz (99.876 kg)   body mass index is 36.33 kg/(m^2).  Physical Exam  Constitutional: He appears well-developed and well-nourished. No distress.  Obese  HENT:  Head: Normocephalic and atraumatic.  Eyes: EOM are normal. No scleral icterus.  Neck: No thyromegaly present.  Cardiovascular: Normal rate and regular rhythm.   Pulmonary/Chest: Effort normal and breath sounds normal.  Abdominal: Soft. Bowel sounds are normal. He exhibits no distension.  Musculoskeletal: He exhibits no edema.  Neurological: Coordination normal.  Skin: Skin is warm and dry. No pallor.  Psychiatric: He has a normal mood and affect. His behavior is normal. Judgment and thought content normal.    Results for orders placed or performed in visit on 06/18/15  Comprehensive Metabolic Panel (CMET)  Result Value Ref Range   Glucose 106 (H) 65 - 99 mg/dL   BUN 27 8 - 27 mg/dL   Creatinine, Ser 1.60 (H) 0.76 - 1.27 mg/dL   GFR  calc non Af Amer 45 (L) >59 mL/min/1.73   GFR calc Af Amer 52 (L) >59 mL/min/1.73   BUN/Creatinine Ratio 17 10 - 22   Sodium 142 134 - 144 mmol/L   Potassium 4.2 3.5 - 5.2 mmol/L   Chloride 102 97 - 108 mmol/L   CO2 22 18 - 29 mmol/L   Calcium 10.5 (H) 8.6 - 10.2 mg/dL   Total Protein 7.1 6.0 - 8.5 g/dL   Albumin 4.7 3.6 - 4.8 g/dL   Globulin, Total 2.4 1.5 - 4.5 g/dL   Albumin/Globulin Ratio 2.0 1.1 - 2.5   Bilirubin Total 0.3 0.0 - 1.2 mg/dL   Alkaline Phosphatase 24 (L) 39 - 117 IU/L   AST 17 0 - 40 IU/L   ALT 25 0 - 44 IU/L      Assessment & Plan:   Problem List Items Addressed This Visit      Cardiovascular and  Mediastinum   Essential hypertension    Try DASH guidelines; work on weight loss; add just a small amount of blood pressure medicine and recheck in 3 weeks      Relevant Medications   metoprolol succinate (TOPROL-XL) 25 MG 24 hr tablet     Genitourinary   Malignant neoplasm of prostate (HCC)    Seeing Dr. Jacqlyn Larsen; taking Lupron shot every 6 months      Chronic kidney disease, stage III (moderate)    Check labs today; seeing Dr. Juleen China        Other   Medication monitoring encounter   Relevant Orders   COMPLETE METABOLIC PANEL WITH GFR   Hypercholesterolemia    Explained that we no longer suggest patients take both fibric acid derivatives plus statin; stop the tricor and continue statin; recheck lipids fasting in 8 weeks; patient will return in the next day or two to get labs now and we'll compare; weight loss and healthy eating encouraged      Relevant Medications   metoprolol succinate (TOPROL-XL) 25 MG 24 hr tablet   Other Relevant Orders   Lipid panel   Abnormal weight gain - Primary    Check thyroid function; encouraged weight loss; see AVS      Relevant Orders   TSH      Follow up plan: Return in about 6 months (around 09/12/2016) for fasting labs and visit with Dr. Jerilynn Mages or Dr. Carlean Jews; return in 3 weeks for CMA BP check, pulse check, weight.  An after-visit summary was printed and given to the patient at [place].  Please see the patient instructions which may contain other information and recommendations beyond what is mentioned above in the assessment and plan.  Meds ordered this encounter  Medications  . metoprolol succinate (TOPROL-XL) 25 MG 24 hr tablet    Sig: Take 1 tablet (25 mg total) by mouth daily.    Dispense:  90 tablet    Refill:  3   Orders Placed This Encounter  Procedures  . COMPLETE METABOLIC PANEL WITH GFR  . Lipid panel  . TSH

## 2016-03-13 NOTE — Assessment & Plan Note (Signed)
Seeing Dr. Jacqlyn Larsen; taking Lupron shot every 6 months

## 2016-03-13 NOTE — Assessment & Plan Note (Signed)
Try DASH guidelines; work on weight loss; add just a small amount of blood pressure medicine and recheck in 3 weeks

## 2016-03-13 NOTE — Assessment & Plan Note (Signed)
Check thyroid function; encouraged weight loss; see AVS

## 2016-03-13 NOTE — Assessment & Plan Note (Signed)
Explained that we no longer suggest patients take both fibric acid derivatives plus statin; stop the tricor and continue statin; recheck lipids fasting in 8 weeks; patient will return in the next day or two to get labs now and we'll compare; weight loss and healthy eating encouraged

## 2016-03-13 NOTE — Assessment & Plan Note (Signed)
Check labs today; seeing Dr. Juleen China

## 2016-03-13 NOTE — Patient Instructions (Addendum)
Try to limit saturated fats in your diet (bologna, hot dogs, barbeque, cheeseburgers, hamburgers, steak, bacon, sausage, cheese, etc.) and get more fresh fruits, vegetables, and whole grains  Your goal blood pressure is less than 140 mmHg on top. Try to follow the DASH guidelines (DASH stands for Dietary Approaches to Stop Hypertension) Try to limit the sodium in your diet.  Ideally, consume less than 1.5 grams (less than 1,500mg ) per day. Do not add salt when cooking or at the table.  Check the sodium amount on labels when shopping, and choose items lower in sodium when given a choice. Avoid or limit foods that already contain a lot of sodium. Eat a diet rich in fruits and vegetables and whole grains.  Add metoprolol to your other blood pressure medicine Recheck blood pressure here with CMA along with weight check and heart rate  Check out the information at familydoctor.org entitled "Nutrition for Weight Loss: What You Need to Know about Fad Diets" Try to lose between 1-2 pounds per week by taking in fewer calories and burning off more calories You can succeed by limiting portions, limiting foods dense in calories and fat, becoming more active, and drinking 8 glasses of water a day (64 ounces) Don't skip meals, especially breakfast, as skipping meals may alter your metabolism Do not use over-the-counter weight loss pills or gimmicks that claim rapid weight loss A healthy BMI (or body mass index) is between 18.5 and 24.9 You can calculate your ideal BMI at the [place] website ClubMonetize.fr  STOP the Tricor, fenofibrate Recheck fasting lipid panel in 8 weeks  DASH Eating Plan DASH stands for "Dietary Approaches to Stop Hypertension." The DASH eating plan is a healthy eating plan that has been shown to reduce high blood pressure (hypertension). Additional health benefits may include reducing the risk of type 2 diabetes mellitus, heart disease,  and stroke. The DASH eating plan may also help with weight loss. WHAT DO I NEED TO KNOW ABOUT THE DASH EATING PLAN? For the DASH eating plan, you will follow these general guidelines:  Choose foods with a percent daily value for sodium of less than 5% (as listed on the food label).  Use salt-free seasonings or herbs instead of table salt or sea salt.  Check with your health care provider or pharmacist before using salt substitutes.  Eat lower-sodium products, often labeled as "lower sodium" or "no salt added."  Eat fresh foods.  Eat more vegetables, fruits, and low-fat dairy products.  Choose whole grains. Look for the word "whole" as the first word in the ingredient list.  Choose fish and skinless chicken or [place] more often than red meat. Limit fish, poultry, and meat to 6 oz (170 g) each day.  Limit sweets, desserts, sugars, and sugary drinks.  Choose heart-healthy fats.  Limit cheese to 1 oz (28 g) per day.  Eat more home-cooked food and less restaurant, buffet, and fast food.  Limit fried foods.  Cook foods using methods other than frying.  Limit canned vegetables. If you do use them, rinse them well to decrease the sodium.  When eating at a restaurant, ask that your food be prepared with less salt, or no salt if possible. WHAT FOODS CAN I EAT? Seek help from a dietitian for individual calorie needs. Grains Whole grain or whole wheat bread. Brown rice. Whole grain or whole wheat pasta. Quinoa, bulgur, and whole grain cereals. Low-sodium cereals. Corn or whole wheat flour tortillas. Whole grain cornbread. Whole grain crackers. Low-sodium crackers.  Vegetables Fresh or frozen vegetables (raw, steamed, roasted, or grilled). Low-sodium or reduced-sodium tomato and vegetable juices. Low-sodium or reduced-sodium tomato sauce and paste. Low-sodium or reduced-sodium canned vegetables.  Fruits All fresh, canned (in natural juice), or frozen fruits. Meat and Other Protein  Products Ground beef (85% or leaner), grass-fed beef, or beef trimmed of fat. Skinless chicken or [place]. Ground chicken or [place]. Pork trimmed of fat. All fish and seafood. Eggs. Dried beans, peas, or lentils. Unsalted nuts and seeds. Unsalted canned beans. Dairy Low-fat dairy products, such as skim or 1% milk, 2% or reduced-fat cheeses, low-fat ricotta or cottage cheese, or plain low-fat yogurt. Low-sodium or reduced-sodium cheeses. Fats and Oils Tub margarines without trans fats. Light or reduced-fat mayonnaise and salad dressings (reduced sodium). Avocado. Safflower, olive, or canola oils. Natural peanut or almond butter. Other Unsalted popcorn and pretzels. The items listed above may not be a complete list of recommended foods or beverages. Contact your dietitian for more options. WHAT FOODS ARE NOT RECOMMENDED? Grains White bread. White pasta. White rice. Refined cornbread. Bagels and croissants. Crackers that contain trans fat. Vegetables Creamed or fried vegetables. Vegetables in a cheese sauce. Regular canned vegetables. Regular canned tomato sauce and paste. Regular tomato and vegetable juices. Fruits Dried fruits. Canned fruit in light or heavy syrup. Fruit juice. Meat and Other Protein Products Fatty cuts of meat. Ribs, chicken wings, bacon, sausage, bologna, salami, chitterlings, fatback, hot dogs, bratwurst, and packaged luncheon meats. Salted nuts and seeds. Canned beans with salt. Dairy Whole or 2% milk, cream, half-and-half, and cream cheese. Whole-fat or sweetened yogurt. Full-fat cheeses or blue cheese. Nondairy creamers and whipped toppings. Processed cheese, cheese spreads, or cheese curds. Condiments Onion and garlic salt, seasoned salt, table salt, and sea salt. Canned and packaged gravies. Worcestershire sauce. Tartar sauce. Barbecue sauce. Teriyaki sauce. Soy sauce, including reduced sodium. Steak sauce. Fish sauce. Oyster sauce. Cocktail sauce. Horseradish. Ketchup and  mustard. Meat flavorings and tenderizers. Bouillon cubes. Hot sauce. Tabasco sauce. Marinades. Taco seasonings. Relishes. Fats and Oils Butter, stick margarine, lard, shortening, ghee, and bacon fat. Coconut, palm kernel, or palm oils. Regular salad dressings. Other Pickles and olives. Salted popcorn and pretzels. The items listed above may not be a complete list of foods and beverages to avoid. Contact your dietitian for more information. WHERE CAN I FIND MORE INFORMATION? National Heart, Lung, and Blood Institute: travelstabloid.com   This information is not intended to replace advice given to you by your health care provider. Make sure you discuss any questions you have with your health care provider.   Document Released: 08/21/2011 Document Revised: 09/22/2014 Document Reviewed: 07/06/2013 Elsevier Interactive Patient Education 2016 Elsevier Inc. Cholesterol Cholesterol is a white, waxy, fat-like substance needed by your body in small amounts. The liver makes all the cholesterol you need. Cholesterol is carried from the liver by the blood through the blood vessels. Deposits of cholesterol (plaque) may build up on blood vessel walls. These make the arteries narrower and stiffer. Cholesterol plaques increase the risk for heart attack and stroke.  You cannot feel your cholesterol level even if it is very high. The only way to know it is high is with a blood test. Once you know your cholesterol levels, you should keep a record of the test results. Work with your health care provider to keep your levels in the desired range.  WHAT DO THE RESULTS MEAN?  Total cholesterol is a rough measure of all the cholesterol in your blood.  LDL is the so-called bad cholesterol. This is the type that deposits cholesterol in the walls of the arteries. You want this level to be low.   HDL is the good cholesterol because it cleans the arteries and carries the LDL away. You  want this level to be high.  Triglycerides are fat that the body can either burn for energy or store. High levels are closely linked to heart disease.  WHAT ARE THE DESIRED LEVELS OF CHOLESTEROL?  Total cholesterol below 200.   LDL below 100 for people at risk, below 70 for those at very high risk.   HDL above 50 is good, above 60 is best.   Triglycerides below 150.  HOW CAN I LOWER MY CHOLESTEROL?  Diet. Follow your diet programs as directed by your health care provider.   Choose fish or white meat chicken and [place], roasted or baked. Limit fatty cuts of red meat, fried foods, and processed meats, such as sausage and lunch meats.   Eat lots of fresh fruits and vegetables.  Choose whole grains, beans, pasta, potatoes, and cereals.   Use only small amounts of olive, corn, or canola oils.   Avoid butter, mayonnaise, shortening, or palm kernel oils.  Avoid foods with trans fats.   Drink skim or nonfat milk and eat low-fat or nonfat yogurt and cheeses. Avoid whole milk, cream, ice cream, egg yolks, and full-fat cheeses.   Healthy desserts include angel food cake, ginger snaps, animal crackers, hard candy, popsicles, and low-fat or nonfat frozen yogurt. Avoid pastries, cakes, pies, and cookies.   Exercise. Follow your exercise programs as directed by your health care provider.   A regular program helps decrease LDL and raise HDL.   A regular program helps with weight control.   Do things that increase your activity level like gardening, walking, or taking the stairs. Ask your health care provider about how you can be more active in your daily life.   Medicine. Take medicine only as directed by your health care provider.   Medicine may be prescribed by your health care provider to help lower cholesterol and decrease the risk for heart disease.   If you have several risk factors, you may need medicine even if your levels are normal.   This information is not  intended to replace advice given to you by your health care provider. Make sure you discuss any questions you have with your health care provider.   Document Released: 05/27/2001 Document Revised: 09/22/2014 Document Reviewed: 06/15/2013 Elsevier Interactive Patient Education [employer].

## 2016-03-14 LAB — LIPID PANEL
CHOL/HDL RATIO: 2.8 ratio (ref <=5.0)
Cholesterol: 130 mg/dL (ref 125–200)
HDL: 47 mg/dL (ref >=40)
LDL Cholesterol: 63 mg/dL (ref <130)
Triglycerides: 102 mg/dL (ref <150)
VLDL: 20 mg/dL (ref <30)

## 2016-03-14 LAB — COMPLETE METABOLIC PANEL WITH GFR
ALBUMIN: 4.2 g/dL (ref 3.6–5.1)
ALT: 22 U/L (ref 9–46)
AST: 14 U/L (ref 10–35)
Alkaline Phosphatase: 24 U/L — ABNORMAL LOW (ref 40–115)
BUN: 17 mg/dL (ref 7–25)
CALCIUM: 9.5 mg/dL (ref 8.6–10.3)
CHLORIDE: 108 mmol/L (ref 98–110)
CO2: 24 mmol/L (ref 20–31)
CREATININE: 1.23 mg/dL (ref 0.70–1.25)
GFR, EST NON AFRICAN AMERICAN: 62 mL/min (ref >=60)
GFR, Est African American: 71 mL/min (ref >=60)
GLUCOSE: 97 mg/dL (ref 65–99)
POTASSIUM: 4.3 mmol/L (ref 3.5–5.3)
Sodium: 142 mmol/L (ref 135–146)
TOTAL PROTEIN: 6.6 g/dL (ref 6.1–8.1)
Total Bilirubin: 0.4 mg/dL (ref 0.2–1.2)

## 2016-03-14 LAB — TSH: TSH: 1.48 m[IU]/L (ref 0.40–4.50)

## 2016-03-19 ENCOUNTER — Telehealth: Payer: Self-pay | Admitting: Emergency Medicine

## 2016-03-19 NOTE — Telephone Encounter (Signed)
Patient notified of lab results

## 2016-03-26 ENCOUNTER — Other Ambulatory Visit: Payer: Self-pay | Admitting: Family Medicine

## 2016-04-03 ENCOUNTER — Ambulatory Visit: Payer: BLUE CROSS/BLUE SHIELD

## 2016-04-03 ENCOUNTER — Other Ambulatory Visit: Payer: Self-pay | Admitting: Family Medicine

## 2016-04-03 VITALS — BP 162/58 | HR 83 | Temp 98.3°F | Wt 230.0 lb

## 2016-04-03 DIAGNOSIS — I10 Essential (primary) hypertension: Secondary | ICD-10-CM

## 2016-04-03 MED ORDER — METOPROLOL SUCCINATE ER 50 MG PO TB24: 50.0000 mg | ORAL_TABLET | Freq: Every day | ORAL | Status: DC

## 2016-04-09 NOTE — Telephone Encounter (Signed)
Patient informed. 

## 2016-04-17 ENCOUNTER — Ambulatory Visit (INDEPENDENT_AMBULATORY_CARE_PROVIDER_SITE_OTHER): Payer: BLUE CROSS/BLUE SHIELD | Admitting: Family Medicine

## 2016-04-17 ENCOUNTER — Encounter: Payer: Self-pay | Admitting: Family Medicine

## 2016-04-17 VITALS — BP 152/60 | HR 75 | Temp 98.9°F | Resp 14 | Wt 231.0 lb

## 2016-04-17 DIAGNOSIS — Z0000 Encounter for general adult medical examination without abnormal findings: Secondary | ICD-10-CM | POA: Diagnosis not present

## 2016-04-17 DIAGNOSIS — C61 Malignant neoplasm of prostate: Secondary | ICD-10-CM | POA: Diagnosis not present

## 2016-04-17 DIAGNOSIS — Z87891 Personal history of nicotine dependence: Secondary | ICD-10-CM | POA: Diagnosis not present

## 2016-04-17 HISTORY — DX: Personal history of nicotine dependence: Z87.891

## 2016-04-17 NOTE — Progress Notes (Signed)
Patient ID: Todd Hutchinson, male   DOB: [birth year], 65 y.o.   MRN: [id number]   Subjective:   Todd Hutchinson is a 65 y.o. male here for a complete physical exam Patient's usual provider is out of the office for an extended time  Interim issues since last visit: no medical excitement  USPSTF grade A and B recommendations Alcohol: some alcohol, well under limit Depression:  Depression screen [facility] 2/9 04/17/2016 03/13/2016 07/10/2015 06/18/2015 04/10/2015  Decreased Interest 0 0 0 0 0  Down, Depressed, Hopeless 0 0 0 0 0  PHQ - 2 Score 0 0 0 0 0   Hypertension: 0000000 systolic here on ours and 160 on his from home; he sees Dr. Juleen China and BP about the same; used to take the chlorthalidone and Cr dropped from 1.6 to 1.23 Obesity: not losing weight; not eating late; does drink water; no sugary drinks; might drink tea once a blue moon; eats one normal portion Tobacco use: former smoker; quit 2 years; he did smoke more than 30 pack years HIV, hep B, hep C: decined STD testing and prevention (chl/gon/syphilis): declined Lipids: done Glucose: done Colorectal cancer: patient thinks due next year Breast cancer: no lumps Lung cancer: ordered CT scan Osteoporosis: no steroids as a child AAA: order next year Aspirin: not taking Diet: fair Exercise: start walking more  Skin cancer: no worrisome moles Hx of prostate cancer; followed by Dr. Edrick Oh, last note Feb 13, 2016; he is on Lupron injections per that note, next injection 08/12/16 or just after; PSA level Feb 13, 2016 was <0.10  Past Medical History:  Diagnosis Date  . Abnormal weight gain 03/13/2016  . COPD (chronic obstructive pulmonary disease) ([place])   . Hx of tobacco use, presenting hazards to health 04/17/2016  . Hypercholesterolemia 03/13/2016  . Hyperlipidemia   . Hypertension   . Prostate cancer [facility])    Past Surgical History:  Procedure Laterality Date  . PROSTATE SURGERY     Family History  Problem Relation  Age of Onset  . Hypertension Mother   . Hypertension Father   . Hypertension Sister   . Hypertension Brother    Social History  Substance Use Topics  . Smoking status: Former Research scientist (life sciences)  . Smokeless tobacco: Never Used  . Alcohol use 0.0 oz/week   Review of Systems  Respiratory: Negative for shortness of breath.   Cardiovascular: Negative for chest pain.    Objective:   Vitals:   04/17/16 1100  BP: (!) 152/60  Pulse: 75  Resp: 14  Temp: 98.9 F (37.2 C)  TempSrc: Oral  SpO2: 94%  Weight: 231 lb (104.8 kg)   Body mass index is 36.18 kg/m. Wt Readings from Last 3 Encounters:  04/17/16 231 lb (104.8 kg)  04/03/16 230 lb (104.3 kg)  03/13/16 232 lb (105.2 kg)   Physical Exam  Constitutional: He appears well-developed and well-nourished. No distress.  HENT:  Head: Normocephalic and atraumatic.  Nose: Nose normal.  Mouth/Throat: Oropharynx is clear and moist.  Eyes: EOM are normal. No scleral icterus.  Neck: No JVD present. No thyromegaly present.  Cardiovascular: Normal rate, regular rhythm and normal heart sounds.   Pulmonary/Chest: Effort normal and breath sounds normal. No respiratory distress. He has no wheezes. He has no rales.  Abdominal: Soft. Bowel sounds are normal. He exhibits no distension. There is no tenderness. There is no guarding.  Musculoskeletal: Normal range of motion. He exhibits no edema.  Lymphadenopathy:    He has no  cervical adenopathy.  Neurological: He is alert. He displays normal reflexes. He exhibits normal muscle tone. Coordination normal.  Skin: Skin is warm and dry. No rash noted. He is not diaphoretic. No erythema. No pallor.  Psychiatric: He has a normal mood and affect. His behavior is normal. Judgment and thought content normal.   Assessment/Plan:   Problem List Items Addressed This Visit      Genitourinary   Malignant neoplasm of prostate ([place])    Under the care of urologist, receiving Lupron injections; last PSA reviewed; last  note reviewed        Other   Hx of tobacco use, presenting hazards to health    Discussed low dose chest CT and will order      Relevant Orders   CT CHEST LUNG CA SCREEN LOW DOSE W/O CM   Annual physical exam    USPSTF grade A and B recommendations reviewed with patient; age-appropriate recommendations, preventive care, screening tests, etc discussed and encouraged; healthy living encouraged; see AVS for patient education given to patient       Other Visit Diagnoses    Preventative health care    -  Primary      No orders of the defined types were placed in this encounter.  Orders Placed This Encounter  Procedures  . CT CHEST LUNG CA SCREEN LOW DOSE W/O CM    Order Specific Question:   Reason for Exam (SYMPTOM  OR DIAGNOSIS REQUIRED)    Answer:   hx of smoking, screening only    Order Specific Question:   Preferred Imaging Location?    Answer:   [facility]    Follow up plan: Return for Welcome to Medical visit when due in the spring.  An After Visit Summary was printed and given to the patient.

## 2016-04-17 NOTE — Assessment & Plan Note (Signed)
Discussed low dose chest CT and will order

## 2016-04-17 NOTE — Patient Instructions (Addendum)
Check out the information at familydoctor.org entitled "Nutrition for Weight Loss: What You Need to Know about Fad Diets" Try to lose between 1-2 pounds per week by taking in fewer calories and burning off more calories You can succeed by limiting portions, limiting foods dense in calories and fat, becoming more active, and drinking 8 glasses of water a day (64 ounces) Don't skip meals, especially breakfast, as skipping meals may alter your metabolism Do not use over-the-counter weight loss pills or gimmicks that claim rapid weight loss A healthy BMI (or body mass index) is between 18.5 and 24.9 You can calculate your ideal BMI at the [place] website ClubMonetize.fr Try a calorie counter Do try to get back into activity and regular walking, build up gradually Try to limit saturated fats in your diet (bologna, hot dogs, barbeque, cheeseburgers, hamburgers, steak, bacon, sausage, cheese, etc.) and get more fresh fruits, vegetables, and whole grains  Do ask Dr. Juleen China if it's okay for you to start back taking a coated 81 mg baby aspirin daily  Health Maintenance, Male A healthy lifestyle and preventative care can promote health and wellness.  Maintain regular health, dental, and eye exams.  Eat a healthy diet. Foods like vegetables, fruits, whole grains, low-fat dairy products, and lean protein foods contain the nutrients you need and are low in calories. Decrease your intake of foods high in solid fats, added sugars, and salt. Get information about a proper diet from your health care provider, if necessary.  Regular physical exercise is one of the most important things you can do for your health. Most adults should get at least 150 minutes of moderate-intensity exercise (any activity that increases your heart rate and causes you to sweat) each week. In addition, most adults need muscle-strengthening exercises on 2 or more days a week.   Maintain a  healthy weight. The body mass index (BMI) is a screening tool to identify possible weight problems. It provides an estimate of body fat based on height and weight. Your health care provider can find your BMI and can help you achieve or maintain a healthy weight. For males 20 years and older:  A BMI below 18.5 is considered underweight.  A BMI of 18.5 to 24.9 is normal.  A BMI of 25 to 29.9 is considered overweight.  A BMI of 30 and above is considered obese.  Maintain normal blood lipids and cholesterol by exercising and minimizing your intake of saturated fat. Eat a balanced diet with plenty of fruits and vegetables. Blood tests for lipids and cholesterol should begin at age 69 and be repeated every 5 years. If your lipid or cholesterol levels are high, you are over age 51, or you are at high risk for heart disease, you may need your cholesterol levels checked more frequently.Ongoing high lipid and cholesterol levels should be treated with medicines if diet and exercise are not working.  If you smoke, find out from your health care provider how to quit. If you do not use tobacco, do not start.  Lung cancer screening is recommended for adults aged 54-80 years who are at high risk for developing lung cancer because of a history of smoking. A yearly low-dose CT scan of the lungs is recommended for people who have at least a 30-pack-year history of smoking and are current smokers or have quit within the past 15 years. A pack year of smoking is smoking an average of 1 pack of cigarettes a day for 1 year (for example, a  30-pack-year history of smoking could mean smoking 1 pack a day for 30 years or 2 packs a day for 15 years). Yearly screening should continue until the smoker has stopped smoking for at least 15 years. Yearly screening should be stopped for people who develop a health problem that would prevent them from having lung cancer treatment.  If you choose to drink alcohol, do not have more than  2 drinks per day. One drink is considered to be 12 oz (360 mL) of beer, 5 oz (150 mL) of wine, or 1.5 oz (45 mL) of liquor.  Avoid the use of street drugs. Do not share needles with anyone. Ask for help if you need support or instructions about stopping the use of drugs.  High blood pressure causes heart disease and increases the risk of stroke. High blood pressure is more likely to develop in:  People who have blood pressure in the end of the normal range (100-139/85-89 mm Hg).  People who are overweight or obese.  People who are African American.  If you are 55-23 years of age, have your blood pressure checked every 3-5 years. If you are 15 years of age or older, have your blood pressure checked every year. You should have your blood pressure measured twice--once when you are at a hospital or clinic, and once when you are not at a hospital or clinic. Record the average of the two measurements. To check your blood pressure when you are not at a hospital or clinic, you can use:  An automated blood pressure machine at a pharmacy.  A home blood pressure monitor.  If you are 28-20 years old, ask your health care provider if you should take aspirin to prevent heart disease.  Diabetes screening involves taking a blood sample to check your fasting blood sugar level. This should be done once every 3 years after age 21 if you are at a normal weight and without risk factors for diabetes. Testing should be considered at a younger age or be carried out more frequently if you are overweight and have at least 1 risk factor for diabetes.  Colorectal cancer can be detected and often prevented. Most routine colorectal cancer screening begins at the age of 6 and continues through age 47. However, your health care provider may recommend screening at an earlier age if you have risk factors for colon cancer. On a yearly basis, your health care provider may provide home test kits to check for hidden blood in the  stool. A small camera at the end of a tube may be used to directly examine the colon (sigmoidoscopy or colonoscopy) to detect the earliest forms of colorectal cancer. Talk to your health care provider about this at age 28 when routine screening begins. A direct exam of the colon should be repeated every 5-10 years through age 23, unless early forms of precancerous polyps or small growths are found.  People who are at an increased risk for hepatitis B should be screened for this virus. You are considered at high risk for hepatitis B if:  You were born in a country where hepatitis B occurs often. Talk with your health care provider about which countries are considered high risk.  Your parents were born in a high-risk country and you have not received a shot to protect against hepatitis B (hepatitis B vaccine).  You have HIV or AIDS.  You use needles to inject street drugs.  You live with, or have sex with, someone who  has hepatitis B.  You are a man who has sex with other men (MSM).  You get hemodialysis treatment.  You take certain medicines for conditions like cancer, organ transplantation, and autoimmune conditions.  Hepatitis C blood testing is recommended for all people born from [birth year] through 1964 and any individual with known risk factors for hepatitis C.  Healthy men should no longer receive prostate-specific antigen (PSA) blood tests as part of routine cancer screening. Talk to your health care provider about prostate cancer screening.  Testicular cancer screening is not recommended for adolescents or adult males who have no symptoms. Screening includes self-exam, a health care provider exam, and other screening tests. Consult with your health care provider about any symptoms you have or any concerns you have about testicular cancer.  Practice safe sex. Use condoms and avoid high-risk sexual practices to reduce the spread of sexually transmitted infections (STIs).  You should be  screened for STIs, including gonorrhea and chlamydia if:  You are sexually active and are younger than 24 years.  You are older than 24 years, and your health care provider tells you that you are at risk for this type of infection.  Your sexual activity has changed since you were last screened, and you are at an increased risk for chlamydia or gonorrhea. Ask your health care provider if you are at risk.  If you are at risk of being infected with HIV, it is recommended that you take a prescription medicine daily to prevent HIV infection. This is called pre-exposure prophylaxis (PrEP). You are considered at risk if:  You are a man who has sex with other men (MSM).  You are a heterosexual man who is sexually active with multiple partners.  You take drugs by injection.  You are sexually active with a partner who has HIV.  Talk with your health care provider about whether you are at high risk of being infected with HIV. If you choose to begin PrEP, you should first be tested for HIV. You should then be tested every 3 months for as long as you are taking PrEP.  Use sunscreen. Apply sunscreen liberally and repeatedly throughout the day. You should seek shade when your shadow is shorter than you. Protect yourself by wearing long sleeves, pants, a wide-brimmed hat, and sunglasses year round whenever you are outdoors.  Tell your health care provider of new moles or changes in moles, especially if there is a change in shape or color. Also, tell your health care provider if a mole is larger than the size of a pencil eraser.  A one-time screening for abdominal aortic aneurysm (AAA) and surgical repair of large AAAs by ultrasound is recommended for men aged 82-75 years who are current or former smokers.  Stay current with your vaccines (immunizations).   This information is not intended to replace advice given to you by your health care provider. Make sure you discuss any questions you have with your  health care provider.   Document Released: 02/28/2008 Document Revised: 09/22/2014 Document Reviewed: 01/27/2011 Elsevier Interactive Patient Education [employer].

## 2016-04-18 ENCOUNTER — Telehealth: Payer: Self-pay | Admitting: *Deleted

## 2016-04-18 NOTE — Telephone Encounter (Signed)
Received referral for initial lung cancer screening scan. Contacted patient and obtained smoking history,(former, quit 01/2013, 45 pack year ) as well as answering questions related to screening process. Patient denies signs of lung cancer such as weight loss or hemoptysis. Patient denies comorbidity that would prevent curative treatment if lung cancer were found. Patient will call back when he can schedule.

## 2016-04-20 NOTE — Assessment & Plan Note (Signed)
Under the care of urologist, receiving Lupron injections; last PSA reviewed; last note reviewed

## 2016-04-20 NOTE — Assessment & Plan Note (Signed)
USPSTF grade A and B recommendations reviewed with patient; age-appropriate recommendations, preventive care, screening tests, etc discussed and encouraged; healthy living encouraged; see AVS for patient education given to patient  

## 2016-05-01 ENCOUNTER — Other Ambulatory Visit: Payer: Self-pay | Admitting: Family Medicine

## 2016-05-12 NOTE — Telephone Encounter (Signed)
Reviewed last sgpt and creatinine and K+; Rxs approved

## 2016-05-27 ENCOUNTER — Telehealth: Payer: Self-pay | Admitting: *Deleted

## 2016-05-27 NOTE — Telephone Encounter (Signed)
Contacted in follow up from prior conversation about setting up lung cancer screening scan. Patient reports that he is still thinking about it and will contact me if he desires to have screening scan. Verified that patient has my contact information.

## 2016-06-04 ENCOUNTER — Other Ambulatory Visit: Payer: Self-pay

## 2016-06-04 MED ORDER — METOPROLOL SUCCINATE ER 50 MG PO TB24: 50.0000 mg | ORAL_TABLET | Freq: Every day | ORAL | 4 refills | Status: DC

## 2016-06-04 MED ORDER — METOPROLOL SUCCINATE ER 50 MG PO TB24: 50.0000 mg | ORAL_TABLET | Freq: Every day | ORAL | 1 refills | Status: DC

## 2016-09-18 ENCOUNTER — Ambulatory Visit: Payer: BLUE CROSS/BLUE SHIELD | Admitting: Family Medicine

## 2016-10-03 ENCOUNTER — Ambulatory Visit: Payer: BLUE CROSS/BLUE SHIELD | Admitting: Family Medicine

## 2016-10-15 ENCOUNTER — Ambulatory Visit (INDEPENDENT_AMBULATORY_CARE_PROVIDER_SITE_OTHER): Payer: BLUE CROSS/BLUE SHIELD | Admitting: Family Medicine

## 2016-10-15 ENCOUNTER — Encounter: Payer: Self-pay | Admitting: Family Medicine

## 2016-10-15 DIAGNOSIS — Z5181 Encounter for therapeutic drug level monitoring: Secondary | ICD-10-CM | POA: Diagnosis not present

## 2016-10-15 DIAGNOSIS — N183 Chronic kidney disease, stage 3 unspecified: Secondary | ICD-10-CM

## 2016-10-15 DIAGNOSIS — E7800 Pure hypercholesterolemia, unspecified: Secondary | ICD-10-CM

## 2016-10-15 DIAGNOSIS — C61 Malignant neoplasm of prostate: Secondary | ICD-10-CM

## 2016-10-15 DIAGNOSIS — I10 Essential (primary) hypertension: Secondary | ICD-10-CM

## 2016-10-15 MED ORDER — METOPROLOL SUCCINATE ER 50 MG PO TB24
50.0000 mg | ORAL_TABLET | Freq: Every day | ORAL | 1 refills | Status: DC
Start: 2016-10-15 — End: 2017-04-15

## 2016-10-15 NOTE — Progress Notes (Signed)
BP 130/62   Pulse 99   Temp 98.6 F (37 C) (Oral)   Resp 16   Wt 229 lb 9 oz (104.1 kg)   SpO2 94%   BMI 35.95 kg/m    Subjective:    Patient ID: Todd Hutchinson, male    DOB: [birth year], 66 y.o.   MRN: [id number]  HPI: Todd Hutchinson is a 66 y.o. male  Chief Complaint  Patient presents with  . Follow-up    6 month    Here for f/u and no medical excitement since last visit  Not fasting today   High cholesterol; on statin; eating beter than 6 months ago;  Lab Results  Component Value Date   CHOL 130 03/13/2016   HDL 47 03/13/2016   LDLCALC 63 03/13/2016   TRIG 102 03/13/2016   CHOLHDL 2.8 03/13/2016   High blood pressure; controlled today; Dr. Raliegh Ip put him back on chlorthalidone 25 mg daily; saw him last week; BP was 168/78 last week at his office; back on medicine, now; nephrologist wants some labs; vit D and kidney function; not having dry mouth; knows to stay away from salt; HTN runs in the family  Really limiting sweets; lost some weight over the last month; working on it; has fit bit; has a Engineer, maintenance at work; he'd like to aim for 200 pounds  Breathing has been okay  Hx of prostate cancer; followed by Dr. Jacqlyn Larsen  Depression screen [facility] 2/9 10/15/2016 04/17/2016 03/13/2016 07/10/2015 06/18/2015  Decreased Interest 0 0 0 0 0  Down, Depressed, Hopeless 0 0 0 0 0  PHQ - 2 Score 0 0 0 0 0   Relevant past medical, surgical, family and social history reviewed Past Medical History:  Diagnosis Date  . Abnormal weight gain 03/13/2016  . COPD (chronic obstructive pulmonary disease) ([place])   . Hx of tobacco use, presenting hazards to health 04/17/2016  . Hypercholesterolemia 03/13/2016  . Hyperlipidemia   . Hypertension   . Prostate cancer [facility])    Past Surgical History:  Procedure Laterality Date  . PROSTATE SURGERY     Family History  Problem Relation Age of Onset  . Hypertension Mother   . Hypertension Father   . Hypertension Sister   . Hypertension  Brother    Social History  Substance Use Topics  . Smoking status: Former Research scientist (life sciences)  . Smokeless tobacco: Never Used  . Alcohol use 0.0 oz/week    Interim medical history since last visit reviewed. Allergies and medications reviewed  Review of Systems Per HPI unless specifically indicated above     Objective:    BP 130/62   Pulse 99   Temp 98.6 F (37 C) (Oral)   Resp 16   Wt 229 lb 9 oz (104.1 kg)   SpO2 94%   BMI 35.95 kg/m   Wt Readings from Last 3 Encounters:  10/15/16 229 lb 9 oz (104.1 kg)  04/17/16 231 lb (104.8 kg)  04/03/16 230 lb (104.3 kg)    Physical Exam  Constitutional: He appears well-developed and well-nourished. No distress.  HENT:  Head: Normocephalic and atraumatic.  Nose: Nose normal.  Mouth/Throat: Oropharynx is clear and moist.  Eyes: EOM are normal. No scleral icterus.  Neck: No JVD present. No thyromegaly present.  Cardiovascular: Normal rate, regular rhythm and normal heart sounds.   Pulmonary/Chest: Effort normal and breath sounds normal. No respiratory distress. He has no wheezes. He has no rales.  Abdominal: Soft. Bowel sounds are normal. He  exhibits no distension. There is no tenderness. There is no guarding.  Musculoskeletal: Normal range of motion. He exhibits no edema.  Lymphadenopathy:    He has no cervical adenopathy.  Neurological: He is alert. He displays normal reflexes. He exhibits normal muscle tone. Coordination normal.  Skin: Skin is warm and dry. No rash noted. He is not diaphoretic. No erythema. No pallor.  Psychiatric: He has a normal mood and affect. His behavior is normal. Judgment and thought content normal.    Results for orders placed or performed in visit on 03/13/16  COMPLETE METABOLIC PANEL WITH GFR  Result Value Ref Range   Sodium 142 135 - 146 mmol/L   Potassium 4.3 3.5 - 5.3 mmol/L   Chloride 108 98 - 110 mmol/L   CO2 24 20 - 31 mmol/L   Glucose, Bld 97 65 - 99 mg/dL   BUN 17 7 - 25 mg/dL   Creat 1.23 0.70  - 1.25 mg/dL   Total Bilirubin 0.4 0.2 - 1.2 mg/dL   Alkaline Phosphatase 24 (L) 40 - 115 U/L   AST 14 10 - 35 U/L   ALT 22 9 - 46 U/L   Total Protein 6.6 6.1 - 8.1 g/dL   Albumin 4.2 3.6 - 5.1 g/dL   Calcium 9.5 8.6 - 10.3 mg/dL   GFR, Est African American 71 >=60 mL/min   GFR, Est Non African American 62 >=60 mL/min  Lipid panel  Result Value Ref Range   Cholesterol 130 125 - 200 mg/dL   Triglycerides 102 <150 mg/dL   HDL 47 >=40 mg/dL   Total CHOL/HDL Ratio 2.8 <=5.0 Ratio   VLDL 20 <30 mg/dL   LDL Cholesterol 63 <130 mg/dL  TSH  Result Value Ref Range   TSH 1.48 0.40 - 4.50 mIU/L      Assessment & Plan:   Problem List Items Addressed This Visit      Cardiovascular and Mediastinum   Essential hypertension    Better control back on the chlorthalidone; check -lytes and send copies to nephrologist; try to lose weight, down to 200 pounds      Relevant Medications   chlorthalidone (HYGROTON) 25 MG tablet   metoprolol succinate (TOPROL-XL) 50 MG 24 hr tablet     Genitourinary   Malignant neoplasm of prostate ([place])    Managed by Dr. Jacqlyn Larsen      Chronic kidney disease, stage III (moderate)    Check Cr and send results to Dr. Juleen China; avoid NSAIDs      Relevant Orders   VITAMIN D 25 Hydroxy (Vit-D Deficiency, Fractures)     Other   Medication monitoring encounter    Check sgpt on statin      Relevant Orders   COMPLETE METABOLIC PANEL WITH GFR   Hypercholesterolemia    Check lipids this week fasting; avoid saturated fats      Relevant Medications   chlorthalidone (HYGROTON) 25 MG tablet   metoprolol succinate (TOPROL-XL) 50 MG 24 hr tablet   Other Relevant Orders   Lipid panel      Follow up plan: Return in about 6 months (around 04/27/2017) for visit and fasting labs.  An after-visit summary was printed and given to the patient at [place].  Please see the patient instructions which may contain other information and recommendations beyond what is mentioned  above in the assessment and plan.  Meds ordered this encounter  Medications  . chlorthalidone (HYGROTON) 25 MG tablet  . metoprolol succinate (TOPROL-XL) 50 MG 24  hr tablet    Sig: Take 1 tablet (50 mg total) by mouth daily. Take with or immediately following a meal.    Dispense:  90 tablet    Refill:  1    Orders Placed This Encounter  Procedures  . COMPLETE METABOLIC PANEL WITH GFR  . VITAMIN D 25 Hydroxy (Vit-D Deficiency, Fractures)  . Lipid panel

## 2016-10-15 NOTE — Assessment & Plan Note (Signed)
Managed by Dr. Cope 

## 2016-10-15 NOTE — Assessment & Plan Note (Signed)
Better control back on the chlorthalidone; check -lytes and send copies to nephrologist; try to lose weight, down to 200 pounds

## 2016-10-15 NOTE — Assessment & Plan Note (Signed)
Check lipids this week fasting; avoid saturated fats

## 2016-10-15 NOTE — Assessment & Plan Note (Signed)
Check sgpt on statin 

## 2016-10-15 NOTE — Patient Instructions (Signed)
  Try to follow the DASH guidelines (DASH stands for Dietary Approaches to Stop Hypertension) Try to limit the sodium in your diet.  Ideally, consume less than 1.5 grams (less than 1,500mg ) per day. Do not add salt when cooking or at the table.  Check the sodium amount on labels when shopping, and choose items lower in sodium when given a choice. Avoid or limit foods that already contain a lot of sodium. Eat a diet rich in fruits and vegetables and whole grains. Check out the information at familydoctor.org entitled "Nutrition for Weight Loss: What You Need to Know about Fad Diets" Try to lose between 1-2 pounds per week by taking in fewer calories and burning off more calories You can succeed by limiting portions, limiting foods dense in calories and fat, becoming more active, and drinking 8 glasses of water a day (64 ounces) Don't skip meals, especially breakfast, as skipping meals may alter your metabolism Do not use over-the-counter weight loss pills or gimmicks that claim rapid weight loss A healthy BMI (or body mass index) is between 18.5 and 24.9 You can calculate your ideal BMI at the [place] website ClubMonetize.fr Aim for 200 pounds  Return for fasting labs in the next week

## 2016-10-15 NOTE — Assessment & Plan Note (Signed)
Check Cr and send results to Dr. Juleen China; avoid NSAIDs

## 2016-10-17 LAB — LIPID PANEL
Cholesterol: 149 mg/dL (ref <200)
HDL: 40 mg/dL — ABNORMAL LOW (ref >40)
LDL CALC: 84 mg/dL (ref <100)
Total CHOL/HDL Ratio: 3.7 Ratio (ref <5.0)
Triglycerides: 124 mg/dL (ref <150)
VLDL: 25 mg/dL (ref <30)

## 2016-10-17 LAB — COMPLETE METABOLIC PANEL WITH GFR
ALBUMIN: 4.3 g/dL (ref 3.6–5.1)
ALK PHOS: 39 U/L — AB (ref 40–115)
ALT: 18 U/L (ref 9–46)
AST: 13 U/L (ref 10–35)
BILIRUBIN TOTAL: 0.4 mg/dL (ref 0.2–1.2)
BUN: 16 mg/dL (ref 7–25)
CO2: 28 mmol/L (ref 20–31)
CREATININE: 1.17 mg/dL (ref 0.70–1.25)
Calcium: 9.7 mg/dL (ref 8.6–10.3)
Chloride: 103 mmol/L (ref 98–110)
GFR, Est African American: 76 mL/min (ref >=60)
GFR, Est Non African American: 65 mL/min (ref >=60)
GLUCOSE: 102 mg/dL — AB (ref 65–99)
Potassium: 4.5 mmol/L (ref 3.5–5.3)
SODIUM: 139 mmol/L (ref 135–146)
Total Protein: 7.4 g/dL (ref 6.1–8.1)

## 2016-10-18 LAB — VITAMIN D 25 HYDROXY (VIT D DEFICIENCY, FRACTURES): Vit D, 25-Hydroxy: 25 ng/mL — ABNORMAL LOW (ref 30–100)

## 2016-10-29 ENCOUNTER — Other Ambulatory Visit: Payer: Self-pay | Admitting: Family Medicine

## 2016-10-29 NOTE — Telephone Encounter (Signed)
Jan 2018 labs reviewed; Rxs approved

## 2017-03-25 ENCOUNTER — Other Ambulatory Visit: Payer: Self-pay | Admitting: Family Medicine

## 2017-04-15 ENCOUNTER — Encounter: Payer: Self-pay | Admitting: Family Medicine

## 2017-04-15 ENCOUNTER — Ambulatory Visit (INDEPENDENT_AMBULATORY_CARE_PROVIDER_SITE_OTHER): Payer: BLUE CROSS/BLUE SHIELD | Admitting: Family Medicine

## 2017-04-15 VITALS — BP 136/64 | HR 118 | Temp 98.5°F | Resp 16 | Wt 232.0 lb

## 2017-04-15 DIAGNOSIS — Z5181 Encounter for therapeutic drug level monitoring: Secondary | ICD-10-CM

## 2017-04-15 DIAGNOSIS — N183 Chronic kidney disease, stage 3 unspecified: Secondary | ICD-10-CM

## 2017-04-15 DIAGNOSIS — R000 Tachycardia, unspecified: Secondary | ICD-10-CM

## 2017-04-15 DIAGNOSIS — I10 Essential (primary) hypertension: Secondary | ICD-10-CM

## 2017-04-15 DIAGNOSIS — E7800 Pure hypercholesterolemia, unspecified: Secondary | ICD-10-CM

## 2017-04-15 DIAGNOSIS — J449 Chronic obstructive pulmonary disease, unspecified: Secondary | ICD-10-CM | POA: Insufficient documentation

## 2017-04-15 DIAGNOSIS — Z23 Encounter for immunization: Secondary | ICD-10-CM

## 2017-04-15 DIAGNOSIS — E669 Obesity, unspecified: Secondary | ICD-10-CM | POA: Diagnosis not present

## 2017-04-15 DIAGNOSIS — Z8546 Personal history of malignant neoplasm of prostate: Secondary | ICD-10-CM

## 2017-04-15 LAB — CBC WITH DIFFERENTIAL/PLATELET
BASOS ABS: 76 {cells}/uL (ref 0–200)
Basophils Relative: 1 %
EOS ABS: 228 {cells}/uL (ref 15–500)
Eosinophils Relative: 3 %
HCT: 38.5 % (ref 38.5–50.0)
Hemoglobin: 12.6 g/dL — ABNORMAL LOW (ref 13.2–17.1)
LYMPHS PCT: 34 %
Lymphs Abs: 2584 cells/uL (ref 850–3900)
MCH: 26.8 pg — ABNORMAL LOW (ref 27.0–33.0)
MCHC: 32.7 g/dL (ref 32.0–36.0)
MCV: 81.9 fL (ref 80.0–100.0)
MONO ABS: 608 {cells}/uL (ref 200–950)
MONOS PCT: 8 %
NEUTROS ABS: 4104 {cells}/uL (ref 1500–7800)
Neutrophils Relative %: 54 %
PLATELETS: 184 10*3/uL (ref 140–400)
RBC: 4.7 MIL/uL (ref 4.20–5.80)
RDW: 14.8 % (ref 11.0–15.0)
WBC: 7.6 10*3/uL (ref 3.8–10.8)

## 2017-04-15 LAB — COMPLETE METABOLIC PANEL WITH GFR
ALT: 18 U/L (ref 9–46)
AST: 11 U/L (ref 10–35)
Albumin: 4.4 g/dL (ref 3.6–5.1)
Alkaline Phosphatase: 36 U/L — ABNORMAL LOW (ref 40–115)
BILIRUBIN TOTAL: 0.4 mg/dL (ref 0.2–1.2)
BUN: 23 mg/dL (ref 7–25)
CO2: 24 mmol/L (ref 20–31)
CREATININE: 1.27 mg/dL — AB (ref 0.70–1.25)
Calcium: 9.8 mg/dL (ref 8.6–10.3)
Chloride: 103 mmol/L (ref 98–110)
GFR, Est African American: 68 mL/min (ref >=60)
GFR, Est Non African American: 59 mL/min — ABNORMAL LOW (ref >=60)
GLUCOSE: 113 mg/dL — AB (ref 65–99)
Potassium: 3.8 mmol/L (ref 3.5–5.3)
SODIUM: 139 mmol/L (ref 135–146)
TOTAL PROTEIN: 7 g/dL (ref 6.1–8.1)

## 2017-04-15 LAB — LIPID PANEL
Cholesterol: 162 mg/dL (ref <200)
HDL: 44 mg/dL (ref >40)
LDL CALC: 72 mg/dL (ref <100)
Total CHOL/HDL Ratio: 3.7 Ratio (ref <5.0)
Triglycerides: 230 mg/dL — ABNORMAL HIGH (ref <150)
VLDL: 46 mg/dL — ABNORMAL HIGH (ref <30)

## 2017-04-15 LAB — TSH: TSH: 1.08 m[IU]/L (ref 0.40–4.50)

## 2017-04-15 LAB — T4, FREE: FREE T4: 1.1 ng/dL (ref 0.8–1.8)

## 2017-04-15 NOTE — Assessment & Plan Note (Signed)
Keep an eye on liver and kidneys

## 2017-04-15 NOTE — Progress Notes (Signed)
BP 136/64   Pulse (!) 118   Temp 98.5 F (36.9 C) (Oral)   Resp 16   Wt 232 lb (105.2 kg)   SpO2 97%   BMI 36.34 kg/m    Subjective:    Patient ID: Todd Hutchinson, male    DOB: 10-Mar-1951, 66 y.o.   MRN: [id number]  HPI: Todd Hutchinson is a 66 y.o. male  Chief Complaint  Patient presents with  . Follow-up    HPI  Here for f/u Pulse is high; was running around this morning; does not feel jittery or nervous; no chest pain; no trouble breathing No weight loss; he quit taking the beta-blocker toprol  Hypertension; he stopped the beta-blocker toprol and is now on coreg; well-controlled; seeing nephrologist for BP; checks at home, fine there; tries to avoid salt; runs in the family  COPD; has albuterol, but never using it; no trouble breathing; breathing does not keep him from doing activities; doing a whole more walking than before  Vitamin B12 replacement; taking vitamin D  High cholesterol; on statin; trying to avoid fatty meats when he can; on fish oil; runs in the family  Obesity; he tries to lose weight; he and his wife both; drinking water, moving plenty  Due for PCV-13 today; already had PPSV-23, due for that five years after last  Depression screen [facility] 2/9 10/15/2016 04/17/2016 03/13/2016 07/10/2015 06/18/2015  Decreased Interest 0 0 0 0 0  Down, Depressed, Hopeless 0 0 0 0 0  PHQ - 2 Score 0 0 0 0 0    Relevant past medical, surgical, family and social history reviewed Past Medical History:  Diagnosis Date  . Abnormal weight gain 03/13/2016  . COPD (chronic obstructive pulmonary disease) ([place])   . Hx of malignant neoplasm of prostate 05/31/2012  . Hx of tobacco use, presenting hazards to health 04/17/2016  . Hypercholesterolemia 03/13/2016  . Hyperlipidemia   . Hypertension   . Prostate cancer [facility])    Past Surgical History:  Procedure Laterality Date  . PROSTATE SURGERY    MD note; managed by Dr. Jacqlyn Larsen  Family History  Problem Relation Age of  Onset  . Hypertension Mother   . Aneurysm Mother   . Hypertension Father   . Heart attack Father   . Hypertension Sister   . Hypertension Brother   . Heart failure Brother   . Hypertension Sister   . Hypertension Brother   . Cancer Brother        throat cancer   Social History   Social History  . Marital status: Married    Spouse name: N/A  . Number of children: N/A  . Years of education: N/A   Occupational History  . Not on file.   Social History Main Topics  . Smoking status: Former Research scientist (life sciences)  . Smokeless tobacco: Never Used  . Alcohol use 1.8 - 2.4 oz/week    3 - 4 Shots of liquor per week     Comment: weekends  . Drug use: No  . Sexual activity: No   Other Topics Concern  . Not on file   Social History Narrative  . No narrative on file    Interim medical history since last visit reviewed. Allergies and medications reviewed  Review of Systems Per HPI unless specifically indicated above     Objective:    BP 136/64   Pulse (!) 118   Temp 98.5 F (36.9 C) (Oral)   Resp 16   Wt 232  lb (105.2 kg)   SpO2 97%   BMI 36.34 kg/m   Wt Readings from Last 3 Encounters:  04/15/17 232 lb (105.2 kg)  10/15/16 229 lb 9 oz (104.1 kg)  04/17/16 231 lb (104.8 kg)    Physical Exam  Constitutional: He appears well-developed and well-nourished. No distress.  HENT:  Head: Normocephalic and atraumatic.  Nose: Nose normal.  Mouth/Throat: Oropharynx is clear and moist.  Eyes: EOM are normal. No scleral icterus.  Neck: No JVD present. No thyromegaly present.  Cardiovascular: Regular rhythm and normal heart sounds.   No extrasystoles are present. Tachycardia present.  Exam reveals no gallop.   No murmur heard. Pulmonary/Chest: Effort normal and breath sounds normal. No respiratory distress. He has no wheezes. He has no rales.  Abdominal: Soft. Bowel sounds are normal. He exhibits no distension. There is no tenderness. There is no guarding.  Musculoskeletal: Normal range  of motion. He exhibits no edema.  Lymphadenopathy:    He has no cervical adenopathy.  Neurological: He is alert. He displays normal reflexes. He exhibits normal muscle tone. Coordination normal.  Skin: Skin is warm and dry. No rash noted. He is not diaphoretic. No erythema. No pallor.  Psychiatric: He has a normal mood and affect. His behavior is normal. Judgment and thought content normal.   Results for orders placed or performed in visit on 10/15/16  COMPLETE METABOLIC PANEL WITH GFR  Result Value Ref Range   Sodium 139 135 - 146 mmol/L   Potassium 4.5 3.5 - 5.3 mmol/L   Chloride 103 98 - 110 mmol/L   CO2 28 20 - 31 mmol/L   Glucose, Bld 102 (H) 65 - 99 mg/dL   BUN 16 7 - 25 mg/dL   Creat 1.17 0.70 - 1.25 mg/dL   Total Bilirubin 0.4 0.2 - 1.2 mg/dL   Alkaline Phosphatase 39 (L) 40 - 115 U/L   AST 13 10 - 35 U/L   ALT 18 9 - 46 U/L   Total Protein 7.4 6.1 - 8.1 g/dL   Albumin 4.3 3.6 - 5.1 g/dL   Calcium 9.7 8.6 - 10.3 mg/dL   GFR, Est African American 76 >=60 mL/min   GFR, Est Non African American 65 >=60 mL/min  VITAMIN D 25 Hydroxy (Vit-D Deficiency, Fractures)  Result Value Ref Range   Vit D, 25-Hydroxy 25 (L) 30 - 100 ng/mL  Lipid panel  Result Value Ref Range   Cholesterol 149 <200 mg/dL   Triglycerides 124 <150 mg/dL   HDL 40 (L) >40 mg/dL   Total CHOL/HDL Ratio 3.7 <5.0 Ratio   VLDL 25 <30 mg/dL   LDL Cholesterol 84 <100 mg/dL      Assessment & Plan:   Problem List Items Addressed This Visit      Cardiovascular and Mediastinum   Essential hypertension    Well-controlled today; try to follow DASH guidelines      Relevant Medications   carvedilol (COREG) 6.25 MG tablet     Respiratory   COPD (chronic obstructive pulmonary disease) (HCC)    Not limiting activities; hx of smoking; on spiriva and has SABA if needed        Genitourinary   Chronic kidney disease, stage III (moderate)    Managed by nephrologist        Other   Obesity (BMI 35.0-39.9  without comorbidity)    Encouraged weight loss      Medication monitoring encounter    Keep an eye on liver and kidneys  Relevant Orders   CBC with Differential/Platelet   COMPLETE METABOLIC PANEL WITH GFR   Hypercholesterolemia - Primary    Check fasting lipids today      Relevant Medications   carvedilol (COREG) 6.25 MG tablet   Other Relevant Orders   Lipid panel   Hx of malignant neoplasm of prostate    Managed by Dr. Jacqlyn Larsen       Other Visit Diagnoses    Need for vaccination against Streptococcus pneumoniae using pneumococcal conjugate vaccine 13       Relevant Orders   Pneumococcal conjugate vaccine 13-valent IM (Completed)   Tachycardia       Relevant Orders   TSH   T4, free       Follow up plan: Return for Welcome to Medicare visit by Sept 25th.  An after-visit summary was printed and given to the patient at [place].  Please see the patient instructions which may contain other information and recommendations beyond what is mentioned above in the assessment and plan.  Meds ordered this encounter  Medications  . carvedilol (COREG) 6.25 MG tablet    Sig: Take 6.25 mg by mouth 2 (two) times daily.  . Omega-3 Fatty Acids (FISH OIL) 1000 MG CAPS    Sig: Take 1,000 mg by mouth daily.  Marland Kitchen DISCONTD: cyanocobalamin 100 MCG tablet    Sig: Take 100 mcg by mouth daily.  . Cholecalciferol (VITAMIN D-3) 1000 units CAPS    Sig: Take 1 capsule (1,000 Units total) by mouth daily.    Orders Placed This Encounter  Procedures  . Pneumococcal conjugate vaccine 13-valent IM  . CBC with Differential/Platelet  . COMPLETE METABOLIC PANEL WITH GFR  . Lipid panel  . TSH  . T4, free

## 2017-04-15 NOTE — Assessment & Plan Note (Signed)
Check fasting lipids today 

## 2017-04-15 NOTE — Patient Instructions (Addendum)
You have received the Prevnar vaccine (PCV-13) and you will not need another booster of this for the rest of your life per current ACIP guidelines You will be due for a second booster of the other pneumonia vaccine (PPSV-23) on or after June 17, 2020 Check out the information at familydoctor.org entitled "Nutrition for Weight Loss: What You Need to Know about Fad Diets" Try to lose between 1-2 pounds per week by taking in fewer calories and burning off more calories You can succeed by limiting portions, limiting foods dense in calories and fat, becoming more active, and drinking 8 glasses of water a day (64 ounces) Don't skip meals, especially breakfast, as skipping meals may alter your metabolism Do not use over-the-counter weight loss pills or gimmicks that claim rapid weight loss A healthy BMI (or body mass index) is between 18.5 and 24.9 You can calculate your ideal BMI at the [place] website ClubMonetize.fr Try to limit everything that comes from a cow or a pig Stay well-hydrated We'll contact you about changing your medicines if needed

## 2017-04-15 NOTE — Assessment & Plan Note (Signed)
Encouraged weight loss 

## 2017-04-15 NOTE — Assessment & Plan Note (Signed)
Not limiting activities; hx of smoking; on spiriva and has SABA if needed

## 2017-04-15 NOTE — Assessment & Plan Note (Signed)
Managed by nephrologist 

## 2017-04-15 NOTE — Assessment & Plan Note (Signed)
Well-controlled today; try to follow DASH guidelines 

## 2017-04-15 NOTE — Assessment & Plan Note (Signed)
Managed by Dr. Cope 

## 2017-06-03 ENCOUNTER — Encounter: Payer: BLUE CROSS/BLUE SHIELD | Admitting: Family Medicine

## 2017-06-30 ENCOUNTER — Encounter: Payer: Self-pay | Admitting: Family Medicine

## 2017-06-30 ENCOUNTER — Ambulatory Visit (INDEPENDENT_AMBULATORY_CARE_PROVIDER_SITE_OTHER): Payer: BLUE CROSS/BLUE SHIELD | Admitting: Family Medicine

## 2017-06-30 VITALS — BP 130/72 | HR 87 | Temp 98.2°F | Resp 16 | Ht 67.13 in | Wt 234.1 lb

## 2017-06-30 DIAGNOSIS — Z136 Encounter for screening for cardiovascular disorders: Secondary | ICD-10-CM | POA: Diagnosis not present

## 2017-06-30 DIAGNOSIS — H6123 Impacted cerumen, bilateral: Secondary | ICD-10-CM

## 2017-06-30 DIAGNOSIS — Z0000 Encounter for general adult medical examination without abnormal findings: Secondary | ICD-10-CM | POA: Diagnosis not present

## 2017-06-30 DIAGNOSIS — Z87891 Personal history of nicotine dependence: Secondary | ICD-10-CM

## 2017-06-30 DIAGNOSIS — R7303 Prediabetes: Secondary | ICD-10-CM

## 2017-06-30 DIAGNOSIS — E669 Obesity, unspecified: Secondary | ICD-10-CM

## 2017-06-30 DIAGNOSIS — N183 Chronic kidney disease, stage 3 unspecified: Secondary | ICD-10-CM

## 2017-06-30 DIAGNOSIS — E7800 Pure hypercholesterolemia, unspecified: Secondary | ICD-10-CM

## 2017-06-30 NOTE — Assessment & Plan Note (Signed)
Check fasting glucose and A1c in November

## 2017-06-30 NOTE — Assessment & Plan Note (Signed)
Recommended [place] to check for AAA because he was a smoker; risk of rupture discussed; he declined today and will think about it

## 2017-06-30 NOTE — Assessment & Plan Note (Signed)
I am here to help, see AVS

## 2017-06-30 NOTE — Assessment & Plan Note (Signed)
Followed by nephrologist

## 2017-06-30 NOTE — Assessment & Plan Note (Signed)
USPSTF grade A and B recommendations reviewed with patient; age-appropriate recommendations, preventive care, screening tests, etc discussed and encouraged; healthy living encouraged; see AVS for patient education given to patient  

## 2017-06-30 NOTE — Patient Instructions (Addendum)
I'll suggest starting walking, start slow and build up gradually You will need a booster of PPSV-23 on or after June 18, 2020 You will never need another booster of PCV-13 according to the current guidelines Consider getting the shingles vaccine (Shingrix) at your local pharmacy Try to work on weight loss  Obesity, Adult Obesity is having too much body fat. If you have a BMI of 30 or more, you are obese. BMI is a number that explains how much body fat you have. Obesity is often caused by taking in (consuming) more calories than your body uses. Obesity can cause serious health problems. Changing your lifestyle can help to treat obesity. Follow these instructions at home: Eating and drinking   Follow advice from your doctor about what to eat and drink. Your doctor may tell you to: ? Cut down on (limit) fast foods, sweets, and processed snack foods. ? Choose low-fat options. For example, choose low-fat milk instead of whole milk. ? Eat 5 or more servings of fruits or vegetables every day. ? Eat at home more often. This gives you more control over what you eat. ? Choose healthy foods when you eat out. ? Learn what a healthy portion size is. A portion size is the amount of a certain food that is healthy for you to eat at one time. This is different for each person. ? Keep low-fat snacks available. ? Avoid sugary drinks. These include soda, fruit juice, iced tea that is sweetened with sugar, and flavored milk. ? Eat a healthy breakfast.  Drink enough water to keep your pee (urine) clear or pale yellow.  Do not go without eating for long periods of time (do not fast).  Do not go on popular or trendy diets (fad diets). Physical Activity  Exercise often, as told by your doctor. Ask your doctor: ? What types of exercise are safe for you. ? How often you should exercise.  Warm up and stretch before being active.  Do slow stretching after being active (cool down).  Rest between times of  being active. Lifestyle  Limit how much time you spend in front of your TV, computer, or video game system (be less sedentary).  Find ways to reward yourself that do not involve food.  Limit alcohol intake to no more than 1 drink a day for nonpregnant women and 2 drinks a day for men. One drink equals 12 oz of beer, 5 oz of wine, or 1 oz of hard liquor. General instructions  Keep a weight loss journal. This can help you keep track of: ? The food that you eat. ? The exercise that you do.  Take over-the-counter and prescription medicines only as told by your doctor.  Take vitamins and supplements only as told by your doctor.  Think about joining a support group. Your doctor may be able to help with this.  Keep all follow-up visits as told by your doctor. This is important. Contact a doctor if:  You cannot meet your weight loss goal after you have changed your diet and lifestyle for 6 weeks. This information is not intended to replace advice given to you by your health care provider. Make sure you discuss any questions you have with your health care provider. Document Released: 11/24/2011 Document Revised: 02/07/2016 Document Reviewed: 06/20/2015 Elsevier Interactive Patient Education  2018 [place] Maintenance, Male A healthy lifestyle and preventive care is important for your health and wellness. Ask your health care provider about what schedule of  regular examinations is right for you. What should I know about weight and diet? Eat a Healthy Diet  Eat plenty of vegetables, fruits, whole grains, low-fat dairy products, and lean protein.  Do not eat a lot of foods high in solid fats, added sugars, or salt.  Maintain a Healthy Weight Regular exercise can help you achieve or maintain a healthy weight. You should:  Do at least 150 minutes of exercise each week. The exercise should increase your heart rate and make you sweat (moderate-intensity exercise).  Do  strength-training exercises at least twice a week.  Watch Your Levels of Cholesterol and Blood Lipids  Have your blood tested for lipids and cholesterol every 5 years starting at 65 years of age. If you are at high risk for heart disease, you should start having your blood tested when you are 65 years old. You may need to have your cholesterol levels checked more often if: ? Your lipid or cholesterol levels are high. ? You are older than 65 years of age. ? You are at high risk for heart disease.  What should I know about cancer screening? Many types of cancers can be detected early and may often be prevented. Lung Cancer  You should be screened every year for lung cancer if: ? You are a current smoker who has smoked for at least 30 years. ? You are a former smoker who has quit within the past 15 years.  Talk to your health care provider about your screening options, when you should start screening, and how often you should be screened.  Colorectal Cancer  Routine colorectal cancer screening usually begins at 65 years of age and should be repeated every 5-10 years until you are 65 years old. You may need to be screened more often if early forms of precancerous polyps or small growths are found. Your health care provider may recommend screening at an earlier age if you have risk factors for colon cancer.  Your health care provider may recommend using home test kits to check for hidden blood in the stool.  A small camera at the end of a tube can be used to examine your colon (sigmoidoscopy or colonoscopy). This checks for the earliest forms of colorectal cancer.  Prostate and Testicular Cancer  Depending on your age and overall health, your health care provider may do certain tests to screen for prostate and testicular cancer.  Talk to your health care provider about any symptoms or concerns you have about testicular or prostate cancer.  Skin Cancer  Check your skin from head to toe  regularly.  Tell your health care provider about any new moles or changes in moles, especially if: ? There is a change in a mole's size, shape, or color. ? You have a mole that is larger than a pencil eraser.  Always use sunscreen. Apply sunscreen liberally and repeat throughout the day.  Protect yourself by wearing long sleeves, pants, a wide-brimmed hat, and sunglasses when outside.  What should I know about heart disease, diabetes, and high blood pressure?  If you are 30-20 years of age, have your blood pressure checked every 3-5 years. If you are 32 years of age or older, have your blood pressure checked every year. You should have your blood pressure measured twice-once when you are at a hospital or clinic, and once when you are not at a hospital or clinic. Record the average of the two measurements. To check your blood pressure when you are not  at a hospital or clinic, you can use: ? An automated blood pressure machine at a pharmacy. ? A home blood pressure monitor.  Talk to your health care provider about your target blood pressure.  If you are between 65-82 years old, ask your health care provider if you should take aspirin to prevent heart disease.  Have regular diabetes screenings by checking your fasting blood sugar level. ? If you are at a normal weight and have a low risk for diabetes, have this test once every three years after the age of 77. ? If you are overweight and have a high risk for diabetes, consider being tested at a younger age or more often.  A one-time screening for abdominal aortic aneurysm (AAA) by ultrasound is recommended for men aged 27-75 years who are current or former smokers. What should I know about preventing infection? Hepatitis B If you have a higher risk for hepatitis B, you should be screened for this virus. Talk with your health care provider to find out if you are at risk for hepatitis B infection. Hepatitis C Blood testing is recommended  for:  Everyone born from [birth year] through 1964.  Anyone with known risk factors for hepatitis C.  Sexually Transmitted Diseases (STDs)  You should be screened each year for STDs including gonorrhea and chlamydia if: ? You are sexually active and are younger than 65 years of age. ? You are older than 66 years of age and your health care provider tells you that you are at risk for this type of infection. ? Your sexual activity has changed since you were last screened and you are at an increased risk for chlamydia or gonorrhea. Ask your health care provider if you are at risk.  Talk with your health care provider about whether you are at high risk of being infected with HIV. Your health care provider may recommend a prescription medicine to help prevent HIV infection.  What else can I do?  Schedule regular health, dental, and eye exams.  Stay current with your vaccines (immunizations).  Do not use any tobacco products, such as cigarettes, chewing tobacco, and e-cigarettes. If you need help quitting, ask your health care provider.  Limit alcohol intake to no more than 2 drinks per day. One drink equals 12 ounces of beer, 5 ounces of wine, or 1 ounces of hard liquor.  Do not use street drugs.  Do not share needles.  Ask your health care provider for help if you need support or information about quitting drugs.  Tell your health care provider if you often feel depressed.  Tell your health care provider if you have ever been abused or do not feel safe at home. This information is not intended to replace advice given to you by your health care provider. Make sure you discuss any questions you have with your health care provider. Document Released: 02/28/2008 Document Revised: 04/30/2016 Document Reviewed: 06/05/2015 Elsevier Interactive Patient Education  [employer].

## 2017-06-30 NOTE — Assessment & Plan Note (Signed)
On statin; encouraged weight loss; check lipids in Nov

## 2017-06-30 NOTE — Assessment & Plan Note (Signed)
Wanted to order chest CT low dose to screen for lung cancer; recommended it and discussed finding cancer at early stages, patient politely declined and will consider; patient to call me back if chooses to get it done

## 2017-06-30 NOTE — Progress Notes (Signed)
Patient: Todd Hutchinson, Male    DOB: 03/16/1951, 66 y.o.   MRN: [id number]  Visit Date: 06/30/2017  Today's Provider: Enid Derry, MD   Chief Complaint  Patient presents with  . Medicare Wellness    Subjective:   Todd Hutchinson is a 66 y.o. male who presents today for his Subsequent Annual Wellness Visit.  Caregiver input:  N/a  USPSTF grade A and B recommendations Depression:  Depression screen [facility] 2/9 06/30/2017 10/15/2016 04/17/2016 03/13/2016 07/10/2015  Decreased Interest 0 0 0 0 0  Down, Depressed, Hopeless 0 0 0 0 0  PHQ - 2 Score 0 0 0 0 0   Hypertension: BP Readings from Last 3 Encounters:  06/30/17 130/72  04/15/17 136/64  10/15/16 130/62   Obesity: offered help, patient has given up on his weight; brothers are not heavy, sisters are heavy; he has about given up Drinks plenty of water Wt Readings from Last 3 Encounters:  06/30/17 234 lb 1.6 oz (106.2 kg)  04/15/17 232 lb (105.2 kg)  10/15/16 229 lb 9 oz (104.1 kg)   BMI Readings from Last 3 Encounters:  06/30/17 36.53 kg/m  04/15/17 36.34 kg/m  10/15/16 35.95 kg/m    Alcohol: 3-4 drinks per weekend Tobacco use: quit 3.5 years ago HIV, hep B, hep C: patient declined Married STD testing and prevention (chl/gon/syphilis): patient declined Lipids: TG elevated; on statin Lab Results  Component Value Date   CHOL 162 04/15/2017   CHOL 149 10/15/2016   CHOL 130 03/13/2016   Lab Results  Component Value Date   HDL 44 04/15/2017   HDL 40 (L) 10/15/2016   HDL 47 03/13/2016   Lab Results  Component Value Date   LDLCALC 72 04/15/2017   LDLCALC 84 10/15/2016   LDLCALC 63 03/13/2016   Lab Results  Component Value Date   TRIG 230 (H) 04/15/2017   TRIG 124 10/15/2016   TRIG 102 03/13/2016   Lab Results  Component Value Date   CHOLHDL 3.7 04/15/2017   CHOLHDL 3.7 10/15/2016   CHOLHDL 2.8 03/13/2016   No results found for: LDLDIRECT Glucose: last glucose at work 124; scattered fam  members with diabetes; prediabetes range; he is not fasting today; will return soon for fasting labs Glucose, Bld  Date Value Ref Range Status  04/15/2017 113 (H) 65 - 99 mg/dL Final  10/15/2016 102 (H) 65 - 99 mg/dL Final  03/13/2016 97 65 - 99 mg/dL Final   Colorectal cancer: done 2014; due next year with Dr. Allen Norris Prostate cancer: sees Dr. Jacqlyn Larsen No results found for: PSA Lung cancer:  Former smoker; quit 3.5 years ago; smoked 1 ppd for 40 years; patient declined chest CT AAA: patient declined [place] Aspirin: not taking aspirin; concerned about his kidneys; they stopped his chlorthalidone, the kidney doctor told him to avoid aspirin Diet: does like greens, takes calcium supplement Exercise: just walks around at work Skin cancer: no worrisome moles  HPI  Review of Systems  Constitutional: Negative for unexpected weight change.  HENT: Negative for hearing loss.   Eyes: Negative for visual disturbance.  Respiratory: Negative for shortness of breath.   Cardiovascular: Negative for chest pain and leg swelling.  Gastrointestinal: Negative for blood in stool.  Endocrine: Negative for polydipsia.  Genitourinary: Negative for hematuria.  Musculoskeletal: Negative for arthralgias.  Skin:       No worrisome moles  Allergic/Immunologic: Negative for food allergies.  Neurological: Negative for tremors.  Hematological: Negative for adenopathy. Does not bruise/bleed easily.  Psychiatric/Behavioral: Negative for dysphoric mood.    Past Medical History:  Diagnosis Date  . Abnormal weight gain 03/13/2016  . COPD (chronic obstructive pulmonary disease) ([place])   . Hx of malignant neoplasm of prostate 05/31/2012  . Hx of tobacco use, presenting hazards to health 04/17/2016  . Hypercholesterolemia 03/13/2016  . Hyperlipidemia   . Hypertension   . Prostate cancer [facility])     Past Surgical History:  Procedure Laterality Date  . PROSTATE SURGERY     Family History  Problem Relation Age of Onset  .  Hypertension Mother   . Aneurysm Mother 68       brain  . Hypertension Father   . Heart attack Father   . Hypertension Sister   . Hypertension Brother   . Heart failure Brother   . Heart disease Paternal Grandfather   . Hypertension Sister   . Hypertension Brother   . Cancer Brother        throat cancer   Social History   Social History  . Marital status: Married    Spouse name: N/A  . Number of children: N/A  . Years of education: N/A   Occupational History  . Not on file.   Social History Main Topics  . Smoking status: Former Research scientist (life sciences)  . Smokeless tobacco: Never Used  . Alcohol use 1.8 - 2.4 oz/week    3 - 4 Shots of liquor per week     Comment: weekends  . Drug use: No  . Sexual activity: No   Other Topics Concern  . Not on file   Social History Narrative  . No narrative on file   Outpatient Encounter Prescriptions as of 06/30/2017  Medication Sig Note  . amLODipine-benazepril (LOTREL) 10-40 MG capsule Take 1 capsule by mouth daily.   . carvedilol (COREG) 6.25 MG tablet Take 6.25 mg by mouth 2 (two) times daily.   . chlorthalidone (HYGROTON) 25 MG tablet Take 25 mg by mouth daily.    . Cholecalciferol (VITAMIN D-3) 1000 units CAPS Take 1 capsule (1,000 Units total) by mouth daily.   . Omega-3 Fatty Acids (FISH OIL) 1000 MG CAPS Take 1,000 mg by mouth daily.   . pravastatin (PRAVACHOL) 40 MG tablet Take 1 tablet (40 mg total) by mouth at bedtime.   Marland Kitchen SPIRIVA HANDIHALER 18 MCG inhalation capsule PLACE 1 CAPSULE INTO INHALER AND INHALE THE CONTENTS OF 1 CAPSULE ONCE DAILY AS DIRECTED IN THE PACKAGE   . albuterol (PROAIR HFA) 108 (90 BASE) MCG/ACT inhaler  04/10/2015: Received from: [facility]   No facility-administered encounter medications on file as of 06/30/2017.     Functional Ability / Safety Screening 1.  Was the timed Get Up and Go test longer than 30 seconds?  no 2.  Does the patient need help with the phone, transportation, shopping,      preparing  meals, housework, laundry, medications, or managing money?  no 3.  Does the patient's home have:  loose throw rugs in the hallway?   no      Grab bars in the bathroom? yes, suction bars      Handrails on the stairs?   yes      Poor lighting?   no 4.  Has the patient noticed any hearing difficulties?   yes, wife thinks he does, he does not listen to her, no real hearing problems he jokes  Fall Risk Assessment See under rooming  Depression Screen See under rooming Depression screen [facility] 2/9 06/30/2017  10/15/2016 04/17/2016 03/13/2016 07/10/2015  Decreased Interest 0 0 0 0 0  Down, Depressed, Hopeless 0 0 0 0 0  PHQ - 2 Score 0 0 0 0 0    Advanced Directives Does patient have a HCPOA?    no If yes, name and contact information:  Does patient have a living will or MOST form?  no; will think about this and discuss with family  Objective:   Vitals: BP 130/72   Pulse 87   Temp 98.2 F (36.8 C) (Oral)   Resp 16   Ht 5' 7.13" (1.705 m)   Wt 234 lb 1.6 oz (106.2 kg)   SpO2 96%   BMI 36.53 kg/m  Body mass index is 36.53 kg/m. No exam data present  Physical Exam  Constitutional: He appears well-developed and well-nourished. No distress.  obese  HENT:  Head: Normocephalic and atraumatic.  Right Ear: Tympanic membrane, external ear and ear canal normal.  Left Ear: Tympanic membrane, external ear and ear canal normal.  Nose: Nose normal. No rhinorrhea.  Mouth/Throat: Oropharynx is clear and moist and mucous membranes are normal.  Both external auditory canals impacted with cerumen; irrigated by CMA without complications; after, EACs are clear, normal TMs  Eyes: EOM are normal. No scleral icterus.  Neck: No JVD present. No thyromegaly present.  Cardiovascular: Normal rate, regular rhythm and normal heart sounds.   Pulmonary/Chest: Effort normal and breath sounds normal. No respiratory distress. He has no wheezes. He has no rales.  Abdominal: Soft. Bowel sounds are normal. He exhibits  no distension. There is no tenderness. There is no guarding.  Musculoskeletal: Normal range of motion. He exhibits no edema.  Lymphadenopathy:    He has no cervical adenopathy.       Right: No supraclavicular adenopathy present.       Left: No supraclavicular adenopathy present.  Neurological: He is alert. He displays normal reflexes. He exhibits normal muscle tone. Coordination normal.  Skin: Skin is warm and dry. No rash noted. He is not diaphoretic. No erythema. No pallor.  Psychiatric: He has a normal mood and affect. His behavior is normal. Judgment and thought content normal.   6CIT Screen 06/30/2017  What Year? 0 points  What month? 0 points  What time? 0 points  Count back from 20 2 points  Months in reverse 0 points  Repeat phrase 0 points  Total Score 2    Assessment & Plan:     Annual Wellness Visit  Reviewed patient's Family Medical History Reviewed and updated list of patient's medical providers Assessment of cognitive impairment was done Assessed patient's functional ability Established a written schedule for health screening [place] Completed and Reviewed  Exercise Activities and Dietary recommendations Goals    . Weight (lb) < 200 lb (90.7 kg)       Immunization History  Administered Date(s) Administered  . Pneumococcal Conjugate-13 04/15/2017  . Pneumococcal Polysaccharide-23 06/18/2015    Health Maintenance  Topic Date Due  . TETANUS/TDAP  12/08/1970  . INFLUENZA VACCINE  06/30/2017 (Originally 04/15/2017)  . Hepatitis C Screening  06/30/2017 (Originally [birth year])  . HIV Screening  06/30/2017 (Originally 12/08/1966)  . COLONOSCOPY  03/04/2018  . PNA vac Low Risk Adult (2 of 2 - PPSV23) 06/17/2020    Discussed health benefits of physical activity, and encouraged him to engage in regular exercise appropriate for his age and condition.   No orders of the defined types were placed in this encounter.   Current Outpatient  Prescriptions:  .  amLODipine-benazepril (LOTREL) 10-40 MG capsule, Take 1 capsule by mouth daily., Disp: 90 capsule, Rfl: 3 .  carvedilol (COREG) 6.25 MG tablet, Take 6.25 mg by mouth 2 (two) times daily., Disp: , Rfl:  .  chlorthalidone (HYGROTON) 25 MG tablet, Take 25 mg by mouth daily. , Disp: , Rfl:  .  Cholecalciferol (VITAMIN D-3) 1000 units CAPS, Take 1 capsule (1,000 Units total) by mouth daily., Disp: , Rfl:  .  Omega-3 Fatty Acids (FISH OIL) 1000 MG CAPS, Take 1,000 mg by mouth daily., Disp: , Rfl:  .  pravastatin (PRAVACHOL) 40 MG tablet, Take 1 tablet (40 mg total) by mouth at bedtime., Disp: 90 tablet, Rfl: 3 .  SPIRIVA HANDIHALER 18 MCG inhalation capsule, PLACE 1 CAPSULE INTO INHALER AND INHALE THE CONTENTS OF 1 CAPSULE ONCE DAILY AS DIRECTED IN THE PACKAGE, Disp: 90 capsule, Rfl: 3 .  albuterol (PROAIR HFA) 108 (90 BASE) MCG/ACT inhaler, , Disp: , Rfl:  There are no discontinued medications.  Next Medicare Wellness Visit in 12+ months  Problem List Items Addressed This Visit      Genitourinary   Chronic kidney disease, stage III (moderate) ([place])    Followed by nephrologist        Other   Welcome to Medicare preventive visit - Primary    USPSTF grade A and B recommendations reviewed with patient; age-appropriate recommendations, preventive care, screening tests, etc discussed and encouraged; healthy living encouraged; see AVS for patient education given to patient      Relevant Orders   EKG 12-Lead   Screening for AAA (abdominal aortic aneurysm)    Recommended [place] to check for AAA because he was a smoker; risk of rupture discussed; he declined today and will think about it      Prediabetes    Check fasting glucose and A1c in November      Relevant Orders   Lipid panel   Hemoglobin A1P   Basic metabolic panel   Obesity (BMI 35.0-39.9 without comorbidity)    I am here to help, see AVS      Hypercholesterolemia    On statin; encouraged weight loss; check  lipids in Nov      Relevant Orders   Lipid panel   Hx of tobacco use, presenting hazards to health    Wanted to order chest CT low dose to screen for lung cancer; recommended it and discussed finding cancer at early stages, patient politely declined and will consider; patient to call me back if chooses to get it done       Other Visit Diagnoses    Bilateral impacted cerumen       Relevant Orders   Ear Lavage

## 2017-07-29 ENCOUNTER — Other Ambulatory Visit: Payer: Self-pay

## 2017-07-29 DIAGNOSIS — E559 Vitamin D deficiency, unspecified: Secondary | ICD-10-CM

## 2017-07-29 DIAGNOSIS — E7800 Pure hypercholesterolemia, unspecified: Secondary | ICD-10-CM

## 2017-07-29 DIAGNOSIS — N183 Chronic kidney disease, stage 3 unspecified: Secondary | ICD-10-CM

## 2017-07-29 DIAGNOSIS — I10 Essential (primary) hypertension: Secondary | ICD-10-CM

## 2017-07-29 DIAGNOSIS — R7303 Prediabetes: Secondary | ICD-10-CM

## 2017-07-29 NOTE — Progress Notes (Signed)
Labs drawn at [facility] so that pt would not have to be stuck twice.

## 2017-07-30 ENCOUNTER — Other Ambulatory Visit: Payer: Self-pay | Admitting: Family Medicine

## 2017-07-30 ENCOUNTER — Telehealth: Payer: Self-pay | Admitting: Family Medicine

## 2017-07-30 DIAGNOSIS — N183 Chronic kidney disease, stage 3 unspecified: Secondary | ICD-10-CM

## 2017-07-30 LAB — MICROALBUMIN / CREATININE URINE RATIO
Creatinine, Urine: 88 mg/dL (ref 20–320)
MICROALB UR: 1.2 mg/dL
MICROALB/CREAT RATIO: 14 ug/mg{creat} (ref <30)

## 2017-07-30 LAB — URINALYSIS, COMPLETE
Bacteria, UA: NONE SEEN /HPF
Bilirubin Urine: NEGATIVE
GLUCOSE, UA: NEGATIVE
HYALINE CAST: NONE SEEN /LPF
Hgb urine dipstick: NEGATIVE
Ketones, ur: NEGATIVE
Leukocytes, UA: NEGATIVE
Nitrite: NEGATIVE
PROTEIN: NEGATIVE
RBC / HPF: NONE SEEN /HPF (ref 0–2)
Specific Gravity, Urine: 1.016 (ref 1.001–1.03)
Squamous Epithelial / LPF: NONE SEEN /HPF (ref <=5)
WBC, UA: NONE SEEN /HPF (ref 0–5)
pH: 5.5 (ref 5.0–8.0)

## 2017-07-30 NOTE — Telephone Encounter (Signed)
Provider informed.

## 2017-07-30 NOTE — Progress Notes (Signed)
See if anything else on "renal function panel" that's not on basic metabolic panel and we can order   Copied from [place] [id number]. Topic: Quick Communication - See Telephone Encounter >> Jul 30, 2017 10:08 AM Vernona Rieger wrote: CRM for notification. See Telephone encounter for:  Desiera from [place] called for the nurse & wanted to let her know that the lab order that she sent yesterday for the BNP is sufficient.  07/30/17. Her call back number is [phone]

## 2017-07-30 NOTE — Telephone Encounter (Signed)
Copied from [place] [id number]. Topic: Quick Communication - See Telephone Encounter >> Jul 30, 2017 10:08 AM Vernona Rieger wrote: CRM for notification. See Telephone encounter for:  Desiera from [place] called for the nurse & wanted to let her know that the lab order that she sent yesterday for the BNP is sufficient.  07/30/17. Her call back number is [phone]

## 2017-07-31 LAB — HEMOGLOBIN A1C
HEMOGLOBIN A1C: 5.9 %{Hb} — AB (ref <5.7)
Mean Plasma Glucose: 123 (calc)
eAG (mmol/L): 6.8 (calc)

## 2017-07-31 LAB — LIPID PANEL
CHOL/HDL RATIO: 3.4 (calc) (ref <5.0)
CHOLESTEROL: 172 mg/dL (ref <200)
HDL: 50 mg/dL (ref >40)
LDL CHOLESTEROL (CALC): 95 mg/dL
NON-HDL CHOLESTEROL (CALC): 122 mg/dL (ref <130)
Triglycerides: 173 mg/dL — ABNORMAL HIGH (ref <150)

## 2017-07-31 LAB — BASIC METABOLIC PANEL
BUN: 21 mg/dL (ref 7–25)
CALCIUM: 9.6 mg/dL (ref 8.6–10.3)
CO2: 28 mmol/L (ref 20–32)
Chloride: 103 mmol/L (ref 98–110)
Creat: 1.11 mg/dL (ref 0.70–1.25)
Glucose, Bld: 99 mg/dL (ref 65–99)
POTASSIUM: 4.3 mmol/L (ref 3.5–5.3)
SODIUM: 140 mmol/L (ref 135–146)

## 2017-07-31 LAB — VITAMIN D 25 HYDROXY (VIT D DEFICIENCY, FRACTURES): Vit D, 25-Hydroxy: 26 ng/mL — ABNORMAL LOW (ref 30–100)

## 2017-07-31 LAB — TEST AUTHORIZATION

## 2017-10-18 ENCOUNTER — Other Ambulatory Visit: Payer: Self-pay | Admitting: Family Medicine

## 2017-10-19 NOTE — Telephone Encounter (Signed)
rx approved

## 2017-10-21 LAB — HM DIABETES EYE EXAM

## 2017-11-01 ENCOUNTER — Other Ambulatory Visit: Payer: Self-pay | Admitting: Family Medicine

## 2017-11-01 NOTE — Telephone Encounter (Signed)
Last Cr and K+ reviewed; Rx approved 

## 2017-11-02 NOTE — Progress Notes (Signed)
Closing out lab/order note open since:  Nov 18

## 2017-11-09 ENCOUNTER — Encounter: Payer: Self-pay | Admitting: Emergency Medicine

## 2017-12-01 ENCOUNTER — Telehealth: Payer: Self-pay

## 2017-12-01 DIAGNOSIS — G4733 Obstructive sleep apnea (adult) (pediatric): Secondary | ICD-10-CM

## 2017-12-01 NOTE — Telephone Encounter (Signed)
Copied from [place] [id number]. Topic: Referral - Request >> Dec 01, 2017  9:17 AM Synthia Innocent wrote: Reason for CRM: Requesting referral for sleep study for snoring. Please advise

## 2017-12-01 NOTE — Telephone Encounter (Signed)
Refer to pulm for snoring, poss OSA Thank you

## 2017-12-01 NOTE — Telephone Encounter (Signed)
Referral placed. Called pt. Called dropped. CRM created.

## 2017-12-28 ENCOUNTER — Telehealth: Payer: Self-pay | Admitting: Family Medicine

## 2017-12-28 NOTE — Telephone Encounter (Signed)
Pt wife came in to give [place] his NEW insurance and it has been loaded in the [place] but she is not sure where the pharm is she just knows that it is not [facility]. Please change in his chart the New pharm . Pt wife did not know what it was and she says that she was told that we would call and find out what it is. If he is needing any refills please send to the New pharm once we know who that is.

## 2017-12-29 NOTE — Telephone Encounter (Signed)
Pharmacy updated to [facility] order

## 2018-01-01 ENCOUNTER — Other Ambulatory Visit: Payer: Self-pay | Admitting: Family Medicine

## 2018-01-01 MED ORDER — CHLORTHALIDONE 25 MG PO TABS: 25.0000 mg | ORAL_TABLET | Freq: Every day | ORAL | 1 refills | Status: DC

## 2018-01-01 NOTE — Telephone Encounter (Signed)
Copied from [place] [id number]. Topic: Quick Communication - Rx Refill/Question >> Jan 01, 2018 10:41 AM Lennox Solders wrote: Medication:chlorthalidone 25 mg #30 Has the patient contacted their pharmacy? No  [facility]  . Pt is waiting on mailorder pharm [employer]

## 2018-01-04 ENCOUNTER — Other Ambulatory Visit: Payer: Self-pay

## 2018-01-04 DIAGNOSIS — E7800 Pure hypercholesterolemia, unspecified: Secondary | ICD-10-CM

## 2018-01-04 DIAGNOSIS — I10 Essential (primary) hypertension: Secondary | ICD-10-CM

## 2018-01-05 MED ORDER — AMLODIPINE BESY-BENAZEPRIL HCL 10-40 MG PO CAPS: 1.0000 | ORAL_CAPSULE | Freq: Every day | ORAL | 1 refills | Status: DC

## 2018-01-05 MED ORDER — CHLORTHALIDONE 25 MG PO TABS: 25.0000 mg | ORAL_TABLET | Freq: Every day | ORAL | 1 refills | Status: DC

## 2018-01-05 MED ORDER — CARVEDILOL 6.25 MG PO TABS: 6.2500 mg | ORAL_TABLET | Freq: Two times a day (BID) | ORAL | 1 refills | Status: DC

## 2018-01-05 MED ORDER — PRAVASTATIN SODIUM 40 MG PO TABS: 40.0000 mg | ORAL_TABLET | Freq: Every day | ORAL | 1 refills | Status: DC

## 2018-01-11 ENCOUNTER — Institutional Professional Consult (permissible substitution): Payer: Self-pay | Admitting: Internal Medicine

## 2018-01-19 ENCOUNTER — Ambulatory Visit: Payer: Medicare HMO | Admitting: Internal Medicine

## 2018-01-19 ENCOUNTER — Encounter: Payer: Self-pay | Admitting: Internal Medicine

## 2018-01-19 VITALS — BP 144/70 | HR 76 | Resp 16 | Ht 67.0 in | Wt 243.0 lb

## 2018-01-19 DIAGNOSIS — G4719 Other hypersomnia: Secondary | ICD-10-CM | POA: Diagnosis not present

## 2018-01-19 DIAGNOSIS — J449 Chronic obstructive pulmonary disease, unspecified: Secondary | ICD-10-CM | POA: Diagnosis not present

## 2018-01-19 NOTE — Progress Notes (Signed)
[facility] Consultation      Assessment and Plan:  Excessive daytime sleepiness -Symptoms and signs of obstructive sleep apnea, will send for sleep study.  COPD. -Spirometry suggestive of mild restriction, this is likely secondary to body habitus.  No evidence of significant COPD/emphysema seen on spirometry. - Currently the patient has minimal to no symptoms of dyspnea on exertion or rest.  Therefore advised patient to try off the Spiriva, if he notices no difference he can remain off of it for the time being, and can reevaluate at next visit.  Orders Placed This Encounter  Procedures  . Spirometry with Graph  . Home sleep test   Return in about 2 months (around 03/21/2018).   Date: 01/19/2018  MRN# [id number] Todd Hutchinson Jan 31, 1964    Todd Hutchinson is a 67 y.o. old male seen in consultation for chief complaint of:    Chief Complaint  Patient presents with  . Consult    Referred by Dr. Sanda Klein for eval OSA: pt has snoring,witnessed apnea and restless sleep.    HPI:  Patient presents with symptoms of loud snoring.  If he goes to bed between 8 and 9 PM.  Patient falls asleep quickly.  She usually gets out of bed at 4:30 AM.  She notes that she has gained approximately 20 pounds in the last 2 years.  Review of her medication list includes Coreg. Epworth score is elevated at 14. Wife is present and gives some of the history. She notes that he snores loudly, and is progressively worsening, he sometimes stops breathing and she nudges him to make him breathe again.  No cataplexy. Denies jaw pain or previous diagnosis of TMJ.  He just retired.  He is told that he has a history of COPD, he denies dyspnea, cough, excess mucus production. He last smoked  About 4 years ago. He is currently on spiriva daily, he is not sure that it is doing anything for him.   **Spirometry tracings personally reviewed 01/19/2018; FVC 75% predicted, FEV1 of 76% predicted  ratio is 77%.  Overall this test shows mild restriction.  PMHX:   Past Medical History:  Diagnosis Date  . Abnormal weight gain 03/13/2016  . COPD (chronic obstructive pulmonary disease) ([place])   . Hx of malignant neoplasm of prostate 05/31/2012  . Hx of tobacco use, presenting hazards to health 04/17/2016  . Hypercholesterolemia 03/13/2016  . Hyperlipidemia   . Hypertension   . Prostate cancer [facility])    Surgical Hx:  Past Surgical History:  Procedure Laterality Date  . PROSTATE SURGERY     Family Hx:  Family History  Problem Relation Age of Onset  . Hypertension Mother   . Aneurysm Mother 43       brain  . Hypertension Father   . Heart attack Father   . Hypertension Sister   . Hypertension Brother   . Heart failure Brother   . Heart disease Paternal Grandfather   . Hypertension Sister   . Hypertension Brother   . Cancer Brother        throat cancer   Social Hx:   Social History   Tobacco Use  . Smoking status: Former Smoker    Packs/day: 1.00    Years: 40.00    Pack years: 40.00  . Smokeless tobacco: Never Used  Substance Use Topics  . Alcohol use: Yes    Alcohol/week: 1.8 - 2.4 oz    Types: 3 - 4 Shots of liquor per  week    Comment: weekends  . Drug use: No   Medication:    Current Outpatient Medications:  .  albuterol (PROAIR HFA) 108 (90 BASE) MCG/ACT inhaler, , Disp: , Rfl:  .  amLODipine-benazepril (LOTREL) 10-40 MG capsule, Take 1 capsule by mouth daily., Disp: 90 capsule, Rfl: 1 .  carvedilol (COREG) 6.25 MG tablet, Take 1 tablet (6.25 mg total) by mouth 2 (two) times daily., Disp: 180 tablet, Rfl: 1 .  chlorthalidone (HYGROTON) 25 MG tablet, Take 1 tablet (25 mg total) by mouth daily., Disp: 90 tablet, Rfl: 1 .  Cholecalciferol (VITAMIN D-3) 1000 units CAPS, Take 1 capsule (1,000 Units total) by mouth daily., Disp: , Rfl:  .  Omega-3 Fatty Acids (FISH OIL) 1000 MG CAPS, Take 1,000 mg by mouth daily., Disp: , Rfl:  .  pravastatin (PRAVACHOL) 40 MG tablet,  Take 1 tablet (40 mg total) by mouth at bedtime., Disp: 90 tablet, Rfl: 1 .  SPIRIVA HANDIHALER 18 MCG inhalation capsule, PLACE 1 CAPSULE INTO INHALER AND INHALE THE CONTENTS OF 1 CAPSULE ONCE DAILY AS DIRECTED IN THE PACKAGE, Disp: 90 capsule, Rfl: 3   Allergies:  Atorvastatin  Review of Systems: Gen:  Denies  fever, sweats, chills HEENT: Denies blurred vision, double vision. bleeds, sore throat Cvc:  No dizziness, chest pain. Resp:   Denies cough or sputum production, shortness of breath Gi: Denies swallowing difficulty, stomach pain. Gu:  Denies bladder incontinence, burning urine Ext:   No Joint pain, stiffness. Skin: No skin rash,  hives  Endoc:  No polyuria, polydipsia. Psych: No depression, insomnia. Other:  All other systems were reviewed with the patient and were negative other that what is mentioned in the HPI.   Physical Examination:   VS: BP (!) 144/70 (BP Location: Left Arm, Cuff Size: Large)   Pulse 76   Resp 16   Ht 5\' 7"  (1.702 m)   Wt 243 lb (110.2 kg)   SpO2 96%   BMI 38.06 kg/m   General Appearance: No distress  Neuro:without focal findings,  speech normal,  HEENT: PERRLA, EOM intact.   Pulmonary: normal breath sounds, No wheezing.  CardiovascularNormal S1,S2.  No m/r/g.   Abdomen: Benign, Soft, non-tender. Renal:  No costovertebral tenderness  GU:  No performed at this time. Endoc: No evident thyromegaly, no signs of acromegaly. Skin:   warm, no rashes, no ecchymosis  Extremities: normal, no cyanosis, clubbing.  Other findings:    LABORATORY PANEL:   CBC No results for input(s): WBC, HGB, HCT, PLT in the last 168 hours. ------------------------------------------------------------------------------------------------------------------  Chemistries  No results for input(s): NA, K, CL, CO2, GLUCOSE, BUN, CREATININE, CALCIUM, MG, AST, ALT, ALKPHOS, BILITOT in the last 168 hours.  Invalid input(s):  GFRCGP ------------------------------------------------------------------------------------------------------------------  Cardiac Enzymes No results for input(s): TROPONINI in the last 168 hours. ------------------------------------------------------------  RADIOLOGY:  No results found.     Thank  you for the consultation and for allowing [facility] to assist in the care of your patient. Our recommendations are noted above.  Please contact us if we can be of further service.   Marda Stalker, MD.  Board Certified in Internal Medicine, Pulmonary Medicine, [place], and Sleep Medicine.  [facility] Office Number: [phone]  Patricia Pesa, M.D.  Merton Border, M.D  01/19/2018

## 2018-01-19 NOTE — Patient Instructions (Addendum)
Will send you for a sleep study.  Try off spiriva for a few weeks, if you notice no difference you can stay off of it.     Sleep Apnea       Sleep apnea is disorder that affects a person's sleep. A person with sleep apnea has abnormal pauses in their breathing when they sleep. It is hard for them to get a good sleep. This makes a person tired during the day. It also can lead to other physical problems. There are three types of sleep apnea. One type is when breathing stops for a short time because your airway is blocked (obstructive sleep apnea). Another type is when the brain sometimes fails to give the normal signal to breathe to the muscles that control your breathing (central sleep apnea). The third type is a combination of the other two types. HOME CARE   Take all medicine as told by your doctor.  Avoid alcohol, calming medicines (sedatives), and depressant drugs.  Try to lose weight if you are overweight. Talk to your doctor about a healthy weight goal.  Your doctor may have you use a device that helps to open your airway. It can help you get the air that you need. It is called a positive airway pressure (PAP) device.   MAKE SURE YOU:   Understand these instructions.  Will watch your condition.  Will get help right away if you are not doing well or get worse.  It may take approximately 1 month for you to get used to wearing her CPAP every night.  Be sure to work with your machine to get used to it, be patient, it may take time!

## 2018-01-20 DIAGNOSIS — R319 Hematuria, unspecified: Secondary | ICD-10-CM | POA: Diagnosis not present

## 2018-01-20 DIAGNOSIS — N183 Chronic kidney disease, stage 3 (moderate): Secondary | ICD-10-CM | POA: Diagnosis not present

## 2018-01-20 DIAGNOSIS — I129 Hypertensive chronic kidney disease with stage 1 through stage 4 chronic kidney disease, or unspecified chronic kidney disease: Secondary | ICD-10-CM | POA: Diagnosis not present

## 2018-01-20 DIAGNOSIS — E559 Vitamin D deficiency, unspecified: Secondary | ICD-10-CM | POA: Diagnosis not present

## 2018-01-20 DIAGNOSIS — E1122 Type 2 diabetes mellitus with diabetic chronic kidney disease: Secondary | ICD-10-CM | POA: Diagnosis not present

## 2018-01-27 ENCOUNTER — Encounter: Payer: Self-pay | Admitting: Family Medicine

## 2018-01-27 DIAGNOSIS — I129 Hypertensive chronic kidney disease with stage 1 through stage 4 chronic kidney disease, or unspecified chronic kidney disease: Secondary | ICD-10-CM | POA: Insufficient documentation

## 2018-01-27 HISTORY — DX: Hypertensive chronic kidney disease with stage 1 through stage 4 chronic kidney disease, or unspecified chronic kidney disease: I12.9

## 2018-02-10 ENCOUNTER — Encounter: Payer: Self-pay | Admitting: Internal Medicine

## 2018-02-10 DIAGNOSIS — G4733 Obstructive sleep apnea (adult) (pediatric): Secondary | ICD-10-CM | POA: Diagnosis not present

## 2018-02-10 DIAGNOSIS — G4719 Other hypersomnia: Secondary | ICD-10-CM

## 2018-02-11 DIAGNOSIS — G4733 Obstructive sleep apnea (adult) (pediatric): Secondary | ICD-10-CM | POA: Diagnosis not present

## 2018-02-12 ENCOUNTER — Telehealth: Payer: Self-pay | Admitting: *Deleted

## 2018-02-12 DIAGNOSIS — G4733 Obstructive sleep apnea (adult) (pediatric): Secondary | ICD-10-CM

## 2018-02-12 NOTE — Telephone Encounter (Signed)
LMOVM for pt to return call for sleep study results. Pt is positive for OSA with an AHI of 45. Recommendations are to set pt up on auto CPAP 5-20 cm H2O.   Will await return call.

## 2018-02-12 NOTE — Telephone Encounter (Signed)
Pt returning our call °Please call back ° °

## 2018-02-16 NOTE — Telephone Encounter (Signed)
Pt informed. Order placed. Nothing further needed.

## 2018-03-04 DIAGNOSIS — G4733 Obstructive sleep apnea (adult) (pediatric): Secondary | ICD-10-CM | POA: Diagnosis not present

## 2018-03-24 ENCOUNTER — Ambulatory Visit: Payer: Medicare HMO | Admitting: Internal Medicine

## 2018-03-26 ENCOUNTER — Ambulatory Visit: Payer: Medicare HMO | Admitting: Internal Medicine

## 2018-04-03 DIAGNOSIS — G4733 Obstructive sleep apnea (adult) (pediatric): Secondary | ICD-10-CM | POA: Diagnosis not present

## 2018-04-06 DIAGNOSIS — G4733 Obstructive sleep apnea (adult) (pediatric): Secondary | ICD-10-CM | POA: Diagnosis not present

## 2018-04-07 DIAGNOSIS — Z6838 Body mass index (BMI) 38.0-38.9, adult: Secondary | ICD-10-CM | POA: Diagnosis not present

## 2018-04-07 DIAGNOSIS — Z79899 Other long term (current) drug therapy: Secondary | ICD-10-CM | POA: Diagnosis not present

## 2018-04-07 DIAGNOSIS — G4730 Sleep apnea, unspecified: Secondary | ICD-10-CM | POA: Diagnosis not present

## 2018-04-07 DIAGNOSIS — C61 Malignant neoplasm of prostate: Secondary | ICD-10-CM | POA: Diagnosis not present

## 2018-04-21 ENCOUNTER — Encounter: Payer: Self-pay | Admitting: Internal Medicine

## 2018-04-26 ENCOUNTER — Ambulatory Visit: Payer: Medicare HMO | Admitting: Internal Medicine

## 2018-04-26 ENCOUNTER — Encounter: Payer: Self-pay | Admitting: Internal Medicine

## 2018-04-26 VITALS — BP 140/64 | HR 90 | Ht 67.0 in | Wt 244.0 lb

## 2018-04-26 DIAGNOSIS — G4733 Obstructive sleep apnea (adult) (pediatric): Secondary | ICD-10-CM | POA: Diagnosis not present

## 2018-04-26 NOTE — Patient Instructions (Addendum)
Continue using cpap every night.  

## 2018-04-26 NOTE — Progress Notes (Signed)
* [facility]     Assessment and Plan:  Severe obstructive sleep apnea. - Doing well with CPAP, to continue.   Date: 04/26/2018  MRN# [id number] Todd Hutchinson March 04, 1951   Todd Hutchinson is a 67 y.o. old male seen in follow up for chief complaint of  Chief Complaint  Patient presents with  . Sleep Apnea    doing well on CPAP     HPI:  He feels that he is doing better during the day, wife notes that he no longer naps throughout the day.  He is using it every night, he is no longer snoring at night.   **Review of download data 03/23/2018- 04/21/2018>> download data personally reviewed, average usage on days used is 7 hours 35 minutes.  Usage greater than 4 hours is 30/30 days.  Pressure is auto 5-20.  Median pressure is 10, 95th percentile pressure 13, maximum pressure 15.  Residual AHI is 1.5.  Overall this shows excellent compliance with CPAP with excellent control of obstructive sleep apnea. **Home sleep test 02/10/2018>> severe OSA with AHI of 45 (61 in the supine position).  Recommended auto CPAP with pressure range 5-20.  **Spirometry  01/19/2018>> FVC 75% predicted, FEV1 of 76% predicted ratio is 77%.  Overall this test shows mild restriction.  Medication:    Current Outpatient Medications:  .  albuterol (PROAIR HFA) 108 (90 BASE) MCG/ACT inhaler, , Disp: , Rfl:  .  amLODipine-benazepril (LOTREL) 10-40 MG capsule, Take 1 capsule by mouth daily., Disp: 90 capsule, Rfl: 1 .  carvedilol (COREG) 6.25 MG tablet, Take 1 tablet (6.25 mg total) by mouth 2 (two) times daily., Disp: 180 tablet, Rfl: 1 .  chlorthalidone (HYGROTON) 25 MG tablet, Take 1 tablet (25 mg total) by mouth daily., Disp: 90 tablet, Rfl: 1 .  Cholecalciferol (VITAMIN D-3) 1000 units CAPS, Take 1 capsule (1,000 Units total) by mouth daily., Disp: , Rfl:  .  Omega-3 Fatty Acids (FISH OIL) 1000 MG CAPS, Take 1,000 mg by mouth daily., Disp: , Rfl:  .  pravastatin (PRAVACHOL) 40 MG  tablet, Take 1 tablet (40 mg total) by mouth at bedtime., Disp: 90 tablet, Rfl: 1 .  SPIRIVA HANDIHALER 18 MCG inhalation capsule, PLACE 1 CAPSULE INTO INHALER AND INHALE THE CONTENTS OF 1 CAPSULE ONCE DAILY AS DIRECTED IN THE PACKAGE, Disp: 90 capsule, Rfl: 3   Allergies:  Atorvastatin  Review of Systems:  Constitutional: Feels well. Cardiovascular: No chest pain.  Pulmonary: Denies dyspnea.   The remainder of systems were reviewed and were found to be negative other than what is documented in the HPI.    Physical Examination:   VS: BP 140/64 (BP Location: Left Arm, Cuff Size: Normal)   Pulse 90   Ht 5\' 7"  (1.702 m)   Wt 244 lb (110.7 kg)   SpO2 93%   BMI 38.22 kg/m   General Appearance: No distress  Neuro:without focal findings, mental status, speech normal, alert and oriented HEENT: PERRLA, EOM intact Pulmonary: No wheezing, No rales  CardiovascularNormal S1,S2.  No m/r/g.  Abdomen: Benign, Soft, non-tender, No masses Renal:  No costovertebral tenderness  GU:  No performed at this time. Endoc: No evident thyromegaly, no signs of acromegaly or Cushing features Skin:   warm, no rashes, no ecchymosis  Extremities: normal, no cyanosis, clubbing.      LABORATORY PANEL:   CBC No results for input(s): WBC, HGB, HCT, PLT in the last 168 hours. ------------------------------------------------------------------------------------------------------------------  Chemistries  No results  for input(s): NA, K, CL, CO2, GLUCOSE, BUN, CREATININE, CALCIUM, MG, AST, ALT, ALKPHOS, BILITOT in the last 168 hours.  Invalid input(s): GFRCGP ------------------------------------------------------------------------------------------------------------------  Cardiac Enzymes No results for input(s): TROPONINI in the last 168 hours. ------------------------------------------------------------  RADIOLOGY:   No results found for this or any previous visit. No results found for this or any  previous visit. ------------------------------------------------------------------------------------------------------------------  Thank  you for allowing [facility] to assist in the care of your patient. Our recommendations are noted above.  Please contact us if we can be of further service.   Marda Stalker, M.D., F.C.C.P.  Board Certified in Internal Medicine, Pulmonary Medicine, [place], and Sleep Medicine.  [facility] Office Number: [phone]  04/26/2018

## 2018-04-30 DIAGNOSIS — I129 Hypertensive chronic kidney disease with stage 1 through stage 4 chronic kidney disease, or unspecified chronic kidney disease: Secondary | ICD-10-CM | POA: Diagnosis not present

## 2018-04-30 DIAGNOSIS — C61 Malignant neoplasm of prostate: Secondary | ICD-10-CM | POA: Diagnosis not present

## 2018-04-30 DIAGNOSIS — J449 Chronic obstructive pulmonary disease, unspecified: Secondary | ICD-10-CM | POA: Diagnosis not present

## 2018-04-30 DIAGNOSIS — R69 Illness, unspecified: Secondary | ICD-10-CM | POA: Diagnosis not present

## 2018-04-30 DIAGNOSIS — Z89411 Acquired absence of right great toe: Secondary | ICD-10-CM | POA: Diagnosis not present

## 2018-04-30 DIAGNOSIS — G4733 Obstructive sleep apnea (adult) (pediatric): Secondary | ICD-10-CM | POA: Diagnosis not present

## 2018-04-30 DIAGNOSIS — G8929 Other chronic pain: Secondary | ICD-10-CM | POA: Diagnosis not present

## 2018-04-30 DIAGNOSIS — E785 Hyperlipidemia, unspecified: Secondary | ICD-10-CM | POA: Diagnosis not present

## 2018-04-30 DIAGNOSIS — I739 Peripheral vascular disease, unspecified: Secondary | ICD-10-CM | POA: Diagnosis not present

## 2018-05-04 DIAGNOSIS — G4733 Obstructive sleep apnea (adult) (pediatric): Secondary | ICD-10-CM | POA: Diagnosis not present

## 2018-05-07 DIAGNOSIS — G4733 Obstructive sleep apnea (adult) (pediatric): Secondary | ICD-10-CM | POA: Diagnosis not present

## 2018-06-01 DIAGNOSIS — R69 Illness, unspecified: Secondary | ICD-10-CM | POA: Diagnosis not present

## 2018-06-04 DIAGNOSIS — R69 Illness, unspecified: Secondary | ICD-10-CM | POA: Diagnosis not present

## 2018-06-04 DIAGNOSIS — G4733 Obstructive sleep apnea (adult) (pediatric): Secondary | ICD-10-CM | POA: Diagnosis not present

## 2018-06-07 DIAGNOSIS — G4733 Obstructive sleep apnea (adult) (pediatric): Secondary | ICD-10-CM | POA: Diagnosis not present

## 2018-07-02 ENCOUNTER — Ambulatory Visit: Payer: Medicare HMO

## 2018-07-02 ENCOUNTER — Ambulatory Visit (INDEPENDENT_AMBULATORY_CARE_PROVIDER_SITE_OTHER): Payer: Medicare HMO | Admitting: Nurse Practitioner

## 2018-07-02 ENCOUNTER — Encounter: Payer: Self-pay | Admitting: Nurse Practitioner

## 2018-07-02 VITALS — BP 150/70 | HR 90 | Temp 98.0°F | Resp 16 | Ht 67.0 in | Wt 242.3 lb

## 2018-07-02 DIAGNOSIS — Z1159 Encounter for screening for other viral diseases: Secondary | ICD-10-CM | POA: Diagnosis not present

## 2018-07-02 DIAGNOSIS — Z1211 Encounter for screening for malignant neoplasm of colon: Secondary | ICD-10-CM

## 2018-07-02 DIAGNOSIS — R7303 Prediabetes: Secondary | ICD-10-CM | POA: Diagnosis not present

## 2018-07-02 DIAGNOSIS — Z0000 Encounter for general adult medical examination without abnormal findings: Secondary | ICD-10-CM

## 2018-07-02 DIAGNOSIS — Z8546 Personal history of malignant neoplasm of prostate: Secondary | ICD-10-CM

## 2018-07-02 DIAGNOSIS — E7800 Pure hypercholesterolemia, unspecified: Secondary | ICD-10-CM | POA: Diagnosis not present

## 2018-07-02 DIAGNOSIS — E559 Vitamin D deficiency, unspecified: Secondary | ICD-10-CM

## 2018-07-02 DIAGNOSIS — N183 Chronic kidney disease, stage 3 unspecified: Secondary | ICD-10-CM

## 2018-07-02 NOTE — Progress Notes (Signed)
Patient: Todd Hutchinson, Male    DOB: 15-Jul-1951, 67 y.o.   MRN: [id number]  Visit Date: 07/02/2018  Today's Provider: Fredderick Severance, NP   Chief Complaint  Patient presents with  . Medicare Wellness    Subjective:    HPI Todd Hutchinson is a 67 y.o. male who presents today for his Subsequent Annual Wellness Visit.  Patient/Caregiver input:   He has white coat syndrome. Checks blood pressures with validated cuff at home and last one was 122/66.   Past Medical History:  Diagnosis Date  . Abnormal weight gain 03/13/2016  . COPD (chronic obstructive pulmonary disease) ([place])   . Hx of malignant neoplasm of prostate 05/31/2012  . Hx of tobacco use, presenting hazards to health 04/17/2016  . Hypercholesterolemia 03/13/2016  . Hyperlipidemia   . Hypertension   . Hypertensive nephrosclerosis 01/27/2018  . Prostate cancer ([place])   . Sleep apnea     Past Surgical History:  Procedure Laterality Date  . PROSTATE SURGERY      Family History  Problem Relation Age of Onset  . Hypertension Mother   . Aneurysm Mother 48       brain  . Hypertension Father   . Heart attack Father   . Hypertension Sister   . Hypertension Brother   . Heart failure Brother   . Heart disease Paternal Grandfather   . Hypertension Sister   . Hypertension Brother   . Cancer Brother        throat cancer    Social History   Socioeconomic History  . Marital status: Married    Spouse name: Dina Rich   . Number of children: 1  . Years of education: Not on file  . Highest education level: 12th grade  Occupational History    Comment: Retired   Scientific laboratory technician  . Financial resource strain: Not hard at all  . Food insecurity:    Worry: Never true    Inability: Never true  . Transportation needs:    Medical: No    Non-medical: No  Tobacco Use  . Smoking status: Former Smoker    Packs/day: 1.00    Years: 40.00    Pack years: 40.00  . Smokeless tobacco: Never Used  Substance and Sexual  Activity  . Alcohol use: Yes    Alcohol/week: 16.0 standard drinks    Types: 16 Shots of liquor per week    Comment: no more than 4 whiskey drinks a day; maybe 4 times day   . Drug use: No  . Sexual activity: Never    Partners: Female  Lifestyle  . Physical activity:    Days per week: 6 days    Minutes per session: 30 min  . Stress: Not at all  Relationships  . Social connections:    Talks on phone: More than three times a week    Gets together: Once a week    Attends religious service: More than 4 times per year    Active member of club or organization: No    Attends meetings of clubs or organizations: Never    Relationship status: Married  . Intimate partner violence:    Fear of current or ex partner: No    Emotionally abused: No    Physically abused: No    Forced sexual activity: No  Other Topics Concern  . Not on file  Social History Narrative  . Not on file    Outpatient Encounter Medications as of 07/02/2018  Medication Sig Note  .  amLODipine-benazepril (LOTREL) 10-40 MG capsule Take 1 capsule by mouth daily.   . carvedilol (COREG) 6.25 MG tablet Take 1 tablet (6.25 mg total) by mouth 2 (two) times daily.   . chlorthalidone (HYGROTON) 25 MG tablet Take 1 tablet (25 mg total) by mouth daily.   . Cholecalciferol (VITAMIN D-3) 1000 units CAPS Take 1 capsule (1,000 Units total) by mouth daily.   . Omega-3 Fatty Acids (FISH OIL) 1000 MG CAPS Take 1,000 mg by mouth daily.   . pravastatin (PRAVACHOL) 40 MG tablet Take 1 tablet (40 mg total) by mouth at bedtime.   Marland Kitchen albuterol (PROAIR HFA) 108 (90 BASE) MCG/ACT inhaler  04/10/2015: Received from: [facility]  . [DISCONTINUED] SPIRIVA HANDIHALER 18 MCG inhalation capsule PLACE 1 CAPSULE INTO INHALER AND INHALE THE CONTENTS OF 1 CAPSULE ONCE DAILY AS DIRECTED IN THE PACKAGE (Patient not taking: Reported on 07/02/2018)    No facility-administered encounter medications on file as of 07/02/2018.     Allergies  Allergen  Reactions  . Atorvastatin Rash    Care Team Updated in EHR: Yes   Diet Recall and Exercise Regimen:  breakfast- bowl of cereal and egg Lunch- sandwich Dinner- whatever wife cooks- broccoli; baked meats,  Doesn't usually snack Drinks lots of water  Was walking every day when it was warm states plans to go to the gym now that is cold    Objective:   Vitals: BP (!) 150/70 (BP Location: Left Arm, Patient Position: Sitting, Cuff Size: Normal)   Pulse 90   Temp 98 F (36.7 C) (Oral)   Resp 16   Ht 5\' 7"  (1.702 m)   Wt 242 lb 4.8 oz (109.9 kg)   SpO2 97%   BMI 37.95 kg/m  Body mass index is 37.95 kg/m.  No exam data present   Fall Risk: Fall Risk  07/02/2018 06/30/2017 10/15/2016 03/13/2016 07/10/2015  Falls in the past year? No No No No No    FALL RISK PREVENTION PERTAINING TO THE HOME:  Any stairs in or around the home WITH handrails? No  Home free of loose throw rugs in walkways, pet beds, electrical cords, etc? No  Adequate lighting in your home to reduce risk of falls? Yes   ASSISTIVE DEVICES UTILIZED TO PREVENT FALLS:  Life alert? No  Use of a cane, walker or w/c? No  Grab bars in the bathroom? Yes  Shower chair or bench in shower? No  Elevated toilet seat or a handicapped toilet? No   DME ORDERS:  DME order needed?  No   TIMED UP AND GO:  Was the test performed? Yes .  Length of time to ambulate 10 feet: 12 sec.   GAIT:  Appearance of gait: Gait stead-fast andwithout the use of an assistive device.Education: Fall risk prevention has been discussed.  Intervention(s) required? No    Zostavax not completed . Due for Shingrix. Education has been provided regarding the importance of this vaccine. Pt has been advised to call insurance company to determine out of pocket expense. Advised may also receive vaccine at local pharmacy or Health Dept. Verbalized acceptance and understanding.  Tdap: Although this vaccine is not a covered service during a Wellness  Exam, does the patient still wish to receive this vaccine today?  No .  Education has been provided regarding the importance of this vaccine. Advised may receive this vaccine at local pharmacy or Health Dept. Aware to provide a copy of the vaccination record if obtained from local pharmacy or  Health Dept. Verbalized acceptance and understanding.  Flu Vaccine: up to date   Pneumococcal Vaccine: up to date   Depression Screen Depression screen [facility] 2/9 07/02/2018 06/30/2017 10/15/2016 04/17/2016 03/13/2016  Decreased Interest 0 0 0 0 0  Down, Depressed, Hopeless 0 0 0 0 0  PHQ - 2 Score 0 0 0 0 0   6CIT Screen 07/02/2018 06/30/2017  What Year? 0 points 0 points  What month? 0 points 0 points  What time? 0 points 0 points  Count back from 20 0 points 2 points  Months in reverse 0 points 0 points  Repeat phrase 0 points 0 points  Total Score 0 2     Cancer Screenings:  Colorectal Screening: Completed 2014. Repeat every 5 years; Referral to GI placed  Lung Cancer Screening: (Low Dose CT Chest recommended if Age 64-80 years, 30 pack-year currently smoking OR have quit w/in 15years.) does qualify. Discussed risks and benefits- states he does not want this; informed him that if he changes his mind he can contact us.    Additional Screening:  Hepatitis C Screening: does qualify; not completed, order placed.   Last Vision Exam: annually 11/2018Wears corrective lenses: Yes Last Dental Exam: 05/2018 at least twice a year Last Hearing Exam: does not remember; was at work; no issues  Wears Hearing Aids: No   Community Resource Referral:  CRR required this visit?  No   Advanced Care Planning: A voluntary discussion about advance care planning including the explanation and discussion of advance directives.  Discussed health care proxy and Living will, and the patient was able to identify a health care proxy as Wife.  Patient does not have a living will at present time. If patient does have  living will, I have requested they bring this to the clinic to be scanned in to their chart. Does patient have a HCPOA?    no If yes, name and contact information:  Does patient have a living will or MOST form?  no Assessment & Plan:     Exercise Activities and Dietary recommendations Goals    . Weight (lb) < 220 lb (99.8 kg)     Plans to exercise more- 6 days a week, at least 30 minutes.       - Discussed health benefits of physical activity, and encouraged him to engage in regular exercise appropriate for his age and condition.     I have personally reviewed and addressed the Medicare Annual Wellness health risk assessment questionnaire and have noted the following in the patient's chart:  A.         Medical and social history & family history B.         Use of alcohol, tobacco, and illicit drugs  C.         Current medications and supplements D.         Functional and Cognitive ability and status E.         Nutritional status F.         Physical activity G.        Advance directives H.        List of other physicians I.          Hospitalizations, surgeries, and ER visits in previous 12 months J.         [place] such as hearing, vision, cognitive function, and depression L.         Referrals  and appointments:   In addition, I have reviewed and discussed with patient certain preventive protocols, quality metrics, and best practice recommendations. A written personalized care plan for preventive services as well as general preventive health recommendations were provided to patient.   See attached scanned questionnaire for additional information.   Return in about 4 months (around 11/02/2018) for 4 month routine with PCP; 1 year AWV .  refresh to show

## 2018-07-02 NOTE — Patient Instructions (Signed)
Todd Hutchinson , Thank you for taking time to come for your Medicare Wellness Visit. I appreciate your ongoing commitment to your health goals. Please review the following plan we discussed and let me know if I can assist you in the future.   Screening recommendations/referrals: Colorectal Screening: sent referral; you should receive a phone call within the month.   Vision and Dental Exams: Recommended annual ophthalmology exams for early detection of glaucoma and other disorders of the eye Recommended annual dental exams for proper oral hygiene  Vaccinations: Influenza vaccine: Up to date  Pneumococcal vaccine: Up to date  Tdap vaccine:  Declined. Please call your insurance company to determine your out of pocket expense. You may also receive this vaccine at your local pharmacy or Health Dept; of please come in if you have any sort of cut on your body.  Shingles vaccine: Please call your insurance company to determine your out of pocket expense for the Shingrix vaccine. You may receive this vaccine at your local pharmacy.  Advanced directives: Advance directives discussed with you today. When you complete this documentatoin please bring a copy in to our office so we can scan it into your chart.   Goals: Recommend weight loss to 220 pounds. Plans to exercise more- 6 days a week, at least 30 minutes.   Next appointment: Please schedule your Annual Wellness Visit with your Nurse Health Advisor in one year.  Preventive Care 66 Years and Older, Male Preventive care refers to lifestyle choices and visits with your health care provider that can promote health and wellness. What does preventive care include?  A yearly physical exam. This is also called an annual well check.  Dental exams once or twice a year.  Routine eye exams. Ask your health care provider how often you should have your eyes checked.  Personal lifestyle choices, including:  Daily care of your teeth and gums.  Regular  physical activity.  Eating a healthy diet.  Avoiding tobacco and drug use.  Limiting alcohol use.  Practicing safe sex.  Taking low doses of aspirin every day if recommended by your health care provider..  Taking vitamin and mineral supplements as recommended by your health care provider. What happens during an annual well check? The services and screenings done by your health care provider during your annual well check will depend on your age, overall health, lifestyle risk factors, and family history of disease. Counseling  Your health care provider may ask you questions about your:  Alcohol use.  Tobacco use.  Drug use.  Emotional well-being.  Home and relationship well-being.  Sexual activity.  Eating habits.  History of falls.  Memory and ability to understand (cognition).  Work and work Statistician. Screening  You may have the following tests or measurements:  Height, weight, and BMI.  Blood pressure.  Lipid and cholesterol levels. These may be checked every 5 years, or more frequently if you are over 80 years old.  Skin check.  Lung cancer screening. You may have this screening every year starting at age 66 if you have a 30-pack-year history of smoking and currently smoke or have quit within the past 15 years.  Fecal occult blood test (FOBT) of the stool. You may have this test every year starting at age 66.  Flexible sigmoidoscopy or colonoscopy. You may have a sigmoidoscopy every 5 years or a colonoscopy every 10 years starting at age 66.  Prostate cancer screening. Recommendations will vary depending on your family history and other  risks.  Hepatitis C blood test.  Hepatitis B blood test.  Sexually transmitted disease (STD) testing.  Diabetes screening. This is done by checking your blood sugar (glucose) after you have not eaten for a while (fasting). You may have this done every 1-3 years.  Abdominal aortic aneurysm (AAA) screening. You may  need this if you are a current or former smoker.  Osteoporosis. You may be screened starting at age 66 if you are at high risk. Talk with your health care provider about your test results, treatment options, and if necessary, the need for more tests. Vaccines  Your health care provider may recommend certain vaccines, such as:  Influenza vaccine. This is recommended every year.  Tetanus, diphtheria, and acellular pertussis (Tdap, Td) vaccine. You may need a Td booster every 10 years.  Zoster vaccine. You may need this after age 59.  Pneumococcal 13-valent conjugate (PCV13) vaccine. One dose is recommended after age 66.  Pneumococcal polysaccharide (PPSV23) vaccine. One dose is recommended after age 66. Talk to your health care provider about which screenings and vaccines you need and how often you need them. This information is not intended to replace advice given to you by your health care provider. Make sure you discuss any questions you have with your health care provider. Document Released: 09/28/2015 Document Revised: 05/21/2016 Document Reviewed: 07/03/2015 Elsevier Interactive Patient Education  2017 [place] Prevention in the Home Falls can cause injuries. They can happen to people of all ages. There are many things you can do to make your home safe and to help prevent falls. What can I do on the outside of my home?  Regularly fix the edges of walkways and driveways and fix any cracks.  Remove anything that might make you trip as you walk through a door, such as a raised step or threshold.  Trim any bushes or trees on the path to your home.  Use bright outdoor lighting.  Clear any walking paths of anything that might make someone trip, such as rocks or tools.  Regularly check to see if handrails are loose or broken. Make sure that both sides of any steps have handrails.  Any raised decks and porches should have guardrails on the edges.  Have any leaves, snow,  or ice cleared regularly.  Use sand or salt on walking paths during winter.  Clean up any spills in your garage right away. This includes oil or grease spills. What can I do in the bathroom?  Use night lights.  Install grab bars by the toilet and in the tub and shower. Do not use towel bars as grab bars.  Use non-skid mats or decals in the tub or shower.  If you need to sit down in the shower, use a plastic, non-slip stool.  Keep the floor dry. Clean up any water that spills on the floor as soon as it happens.  Remove soap buildup in the tub or shower regularly.  Attach bath mats securely with double-sided non-slip rug tape.  Do not have throw rugs and other things on the floor that can make you trip. What can I do in the bedroom?  Use night lights.  Make sure that you have a light by your bed that is easy to reach.  Do not use any sheets or blankets that are too big for your bed. They should not hang down onto the floor.  Have a firm chair that has side arms. You can use this for support  while you get dressed.  Do not have throw rugs and other things on the floor that can make you trip. What can I do in the kitchen?  Clean up any spills right away.  Avoid walking on wet floors.  Keep items that you use a lot in easy-to-reach places.  If you need to reach something above you, use a strong step stool that has a grab bar.  Keep electrical cords out of the way.  Do not use floor polish or wax that makes floors slippery. If you must use wax, use non-skid floor wax.  Do not have throw rugs and other things on the floor that can make you trip. What can I do with my stairs?  Do not leave any items on the stairs.  Make sure that there are handrails on both sides of the stairs and use them. Fix handrails that are broken or loose. Make sure that handrails are as long as the stairways.  Check any carpeting to make sure that it is firmly attached to the stairs. Fix any  carpet that is loose or worn.  Avoid having throw rugs at the top or bottom of the stairs. If you do have throw rugs, attach them to the floor with carpet tape.  Make sure that you have a light switch at the top of the stairs and the bottom of the stairs. If you do not have them, ask someone to add them for you. What else can I do to help prevent falls?  Wear shoes that:  Do not have high heels.  Have rubber bottoms.  Are comfortable and fit you well.  Are closed at the toe. Do not wear sandals.  If you use a stepladder:  Make sure that it is fully opened. Do not climb a closed stepladder.  Make sure that both sides of the stepladder are locked into place.  Ask someone to hold it for you, if possible.  Clearly mark and make sure that you can see:  Any grab bars or handrails.  First and last steps.  Where the edge of each step is.  Use tools that help you move around (mobility aids) if they are needed. These include:  Canes.  Walkers.  Scooters.  Crutches.  Turn on the lights when you go into a dark area. Replace any light bulbs as soon as they burn out.  Set up your furniture so you have a clear path. Avoid moving your furniture around.  If any of your floors are uneven, fix them.  If there are any pets around you, be aware of where they are.  Review your medicines with your doctor. Some medicines can make you feel dizzy. This can increase your chance of falling. Ask your doctor what other things that you can do to help prevent falls. This information is not intended to replace advice given to you by your health care provider. Make sure you discuss any questions you have with your health care provider. Document Released: 06/28/2009 Document Revised: 02/07/2016 Document Reviewed: 10/06/2014 Elsevier Interactive Patient Education  2017 [employer].

## 2018-07-03 LAB — VITAMIN D 25 HYDROXY (VIT D DEFICIENCY, FRACTURES): Vit D, 25-Hydroxy: 28 ng/mL — ABNORMAL LOW (ref 30–100)

## 2018-07-03 LAB — COMPLETE METABOLIC PANEL WITH GFR
AG Ratio: 1.5 (calc) (ref 1.0–2.5)
ALBUMIN MSPROF: 4.5 g/dL (ref 3.6–5.1)
ALT: 18 U/L (ref 9–46)
AST: 12 U/L (ref 10–35)
Alkaline phosphatase (APISO): 37 U/L — ABNORMAL LOW (ref 40–115)
BUN / CREAT RATIO: 17 (calc) (ref 6–22)
BUN: 22 mg/dL (ref 7–25)
CO2: 26 mmol/L (ref 20–32)
CREATININE: 1.31 mg/dL — AB (ref 0.70–1.25)
Calcium: 10.2 mg/dL (ref 8.6–10.3)
Chloride: 103 mmol/L (ref 98–110)
GFR, Est African American: 65 mL/min/{1.73_m2} (ref >=60)
GFR, Est Non African American: 56 mL/min/{1.73_m2} — ABNORMAL LOW (ref >=60)
GLUCOSE: 100 mg/dL — AB (ref 65–99)
Globulin: 3 g/dL (calc) (ref 1.9–3.7)
Potassium: 4 mmol/L (ref 3.5–5.3)
Sodium: 141 mmol/L (ref 135–146)
TOTAL PROTEIN: 7.5 g/dL (ref 6.1–8.1)
Total Bilirubin: 0.5 mg/dL (ref 0.2–1.2)

## 2018-07-03 LAB — HEMOGLOBIN A1C
Hgb A1c MFr Bld: 5.9 % of total Hgb — ABNORMAL HIGH (ref <5.7)
MEAN PLASMA GLUCOSE: 123 (calc)
eAG (mmol/L): 6.8 (calc)

## 2018-07-03 LAB — LIPID PANEL
CHOL/HDL RATIO: 3.3 (calc) (ref <5.0)
CHOLESTEROL: 170 mg/dL (ref <200)
HDL: 51 mg/dL (ref >40)
LDL Cholesterol (Calc): 93 mg/dL (calc)
Non-HDL Cholesterol (Calc): 119 mg/dL (calc) (ref <130)
Triglycerides: 158 mg/dL — ABNORMAL HIGH (ref <150)

## 2018-07-03 LAB — HEPATITIS C ANTIBODY
Hepatitis C Ab: NONREACTIVE
SIGNAL TO CUT-OFF: 0.01 (ref <1.00)

## 2018-07-04 DIAGNOSIS — G4733 Obstructive sleep apnea (adult) (pediatric): Secondary | ICD-10-CM | POA: Diagnosis not present

## 2018-07-05 ENCOUNTER — Other Ambulatory Visit: Payer: Self-pay | Admitting: Family Medicine

## 2018-07-06 ENCOUNTER — Other Ambulatory Visit: Payer: Self-pay

## 2018-07-06 ENCOUNTER — Encounter: Payer: Self-pay | Admitting: Gastroenterology

## 2018-07-06 DIAGNOSIS — Z86010 Personal history of colonic polyps: Secondary | ICD-10-CM

## 2018-07-08 DIAGNOSIS — G4733 Obstructive sleep apnea (adult) (pediatric): Secondary | ICD-10-CM | POA: Diagnosis not present

## 2018-07-15 DIAGNOSIS — E559 Vitamin D deficiency, unspecified: Secondary | ICD-10-CM | POA: Diagnosis not present

## 2018-07-15 DIAGNOSIS — D631 Anemia in chronic kidney disease: Secondary | ICD-10-CM | POA: Diagnosis not present

## 2018-07-15 DIAGNOSIS — E785 Hyperlipidemia, unspecified: Secondary | ICD-10-CM | POA: Diagnosis not present

## 2018-07-15 DIAGNOSIS — I129 Hypertensive chronic kidney disease with stage 1 through stage 4 chronic kidney disease, or unspecified chronic kidney disease: Secondary | ICD-10-CM | POA: Diagnosis not present

## 2018-07-15 DIAGNOSIS — I10 Essential (primary) hypertension: Secondary | ICD-10-CM | POA: Diagnosis not present

## 2018-07-15 DIAGNOSIS — N183 Chronic kidney disease, stage 3 (moderate): Secondary | ICD-10-CM | POA: Diagnosis not present

## 2018-07-21 DIAGNOSIS — N183 Chronic kidney disease, stage 3 (moderate): Secondary | ICD-10-CM | POA: Diagnosis not present

## 2018-07-21 DIAGNOSIS — I129 Hypertensive chronic kidney disease with stage 1 through stage 4 chronic kidney disease, or unspecified chronic kidney disease: Secondary | ICD-10-CM | POA: Diagnosis not present

## 2018-07-26 ENCOUNTER — Telehealth: Payer: Self-pay | Admitting: Gastroenterology

## 2018-07-26 ENCOUNTER — Other Ambulatory Visit: Payer: Self-pay

## 2018-07-26 MED ORDER — PEG 3350-KCL-NABCB-NACL-NASULF 236 G PO SOLR: ORAL | 0 refills | Status: DC

## 2018-07-26 NOTE — Telephone Encounter (Signed)
Bowel prep has been changed to Golytely per pt request as Suprep was too expensive.

## 2018-07-26 NOTE — Telephone Encounter (Signed)
Pt needs says the prep medication is out of budget. Would like a lower cost medication. Pls new new prescription to [facility] in [place] asap

## 2018-07-27 DIAGNOSIS — D128 Benign neoplasm of rectum: Secondary | ICD-10-CM | POA: Diagnosis not present

## 2018-07-27 DIAGNOSIS — D122 Benign neoplasm of ascending colon: Secondary | ICD-10-CM | POA: Diagnosis not present

## 2018-07-27 DIAGNOSIS — D120 Benign neoplasm of cecum: Secondary | ICD-10-CM | POA: Diagnosis not present

## 2018-07-28 ENCOUNTER — Other Ambulatory Visit: Payer: Self-pay | Admitting: Family Medicine

## 2018-07-28 DIAGNOSIS — I10 Essential (primary) hypertension: Secondary | ICD-10-CM

## 2018-07-28 NOTE — Discharge Instructions (Signed)
General Anesthesia, Adult, Care After °These instructions provide you with information about caring for yourself after your procedure. Your health care provider may also give you more specific instructions. Your treatment has been planned according to current medical practices, but problems sometimes occur. Call your health care provider if you have any problems or questions after your procedure. °What can I expect after the procedure? °After the procedure, it is common to have: °· Vomiting. °· A sore throat. °· Mental slowness. ° °It is common to feel: °· Nauseous. °· Cold or shivery. °· Sleepy. °· Tired. °· Sore or achy, even in parts of your body where you did not have surgery. ° °Follow these instructions at home: °For at least 24 hours after the procedure: °· Do not: °? Participate in activities where you could fall or become injured. °? Drive. °? Use heavy machinery. °? Drink alcohol. °? Take sleeping pills or medicines that cause drowsiness. °? Make important decisions or sign legal documents. °? Take care of children on your own. °· Rest. °Eating and drinking °· If you vomit, drink water, juice, or soup when you can drink without vomiting. °· Drink enough fluid to keep your urine clear or pale yellow. °· Make sure you have little or no nausea before eating solid foods. °· Follow the diet recommended by your health care provider. °General instructions °· Have a responsible adult stay with you until you are awake and alert. °· Return to your normal activities as told by your health care provider. Ask your health care provider what activities are safe for you. °· Take over-the-counter and prescription medicines only as told by your health care provider. °· If you smoke, do not smoke without supervision. °· Keep all follow-up visits as told by your health care provider. This is important. °Contact a health care provider if: °· You continue to have nausea or vomiting at home, and medicines are not helpful. °· You  cannot drink fluids or start eating again. °· You cannot urinate after 8-12 hours. °· You develop a skin rash. °· You have fever. °· You have increasing redness at the site of your procedure. °Get help right away if: °· You have difficulty breathing. °· You have chest pain. °· You have unexpected bleeding. °· You feel that you are having a life-threatening or urgent problem. °This information is not intended to replace advice given to you by your health care provider. Make sure you discuss any questions you have with your health care provider. °Document Released: 12/08/2000 Document Revised: 02/04/2016 Document Reviewed: 08/16/2015 °Elsevier Interactive Patient Education © 2018 Elsevier Inc. ° °

## 2018-07-29 ENCOUNTER — Encounter
Admission: RE | Disposition: A | Discharge status: Home / Self Care | Payer: Self-pay | Source: Ambulatory Visit | Attending: Gastroenterology

## 2018-07-29 ENCOUNTER — Ambulatory Visit: Payer: Medicare HMO | Admitting: Anesthesiology

## 2018-07-29 ENCOUNTER — Ambulatory Visit
Admission: RE | Admit: 2018-07-29 | Discharge: 2018-07-29 | Disposition: A | Discharge status: Home / Self Care | Payer: Medicare HMO | Source: Ambulatory Visit | Attending: Gastroenterology | Admitting: Gastroenterology

## 2018-07-29 DIAGNOSIS — K5730 Diverticulosis of large intestine without perforation or abscess without bleeding: Secondary | ICD-10-CM | POA: Insufficient documentation

## 2018-07-29 DIAGNOSIS — J449 Chronic obstructive pulmonary disease, unspecified: Secondary | ICD-10-CM | POA: Diagnosis not present

## 2018-07-29 DIAGNOSIS — K621 Rectal polyp: Secondary | ICD-10-CM | POA: Insufficient documentation

## 2018-07-29 DIAGNOSIS — Z86010 Personal history of colonic polyps: Secondary | ICD-10-CM | POA: Diagnosis not present

## 2018-07-29 DIAGNOSIS — D122 Benign neoplasm of ascending colon: Secondary | ICD-10-CM | POA: Insufficient documentation

## 2018-07-29 DIAGNOSIS — G4730 Sleep apnea, unspecified: Secondary | ICD-10-CM | POA: Diagnosis not present

## 2018-07-29 DIAGNOSIS — Z1211 Encounter for screening for malignant neoplasm of colon: Secondary | ICD-10-CM | POA: Diagnosis not present

## 2018-07-29 DIAGNOSIS — E7800 Pure hypercholesterolemia, unspecified: Secondary | ICD-10-CM | POA: Diagnosis not present

## 2018-07-29 DIAGNOSIS — Z87891 Personal history of nicotine dependence: Secondary | ICD-10-CM | POA: Insufficient documentation

## 2018-07-29 DIAGNOSIS — D120 Benign neoplasm of cecum: Secondary | ICD-10-CM | POA: Diagnosis not present

## 2018-07-29 DIAGNOSIS — I10 Essential (primary) hypertension: Secondary | ICD-10-CM | POA: Insufficient documentation

## 2018-07-29 DIAGNOSIS — Z79899 Other long term (current) drug therapy: Secondary | ICD-10-CM | POA: Diagnosis not present

## 2018-07-29 HISTORY — PX: POLYPECTOMY: SHX5525

## 2018-07-29 HISTORY — PX: COLONOSCOPY WITH PROPOFOL: SHX5780

## 2018-07-29 SURGERY — COLONOSCOPY WITH PROPOFOL
Anesthesia: General | Site: Rectum

## 2018-07-29 MED ORDER — STERILE WATER FOR IRRIGATION IR SOLN
Status: DC | PRN
  Administered 2018-07-29: 08:00:00

## 2018-07-29 MED ORDER — SODIUM CHLORIDE 0.9 % IV SOLN: INTRAVENOUS | Status: DC

## 2018-07-29 MED ORDER — LACTATED RINGERS IV SOLN: INTRAVENOUS | Status: DC

## 2018-07-29 MED ORDER — LIDOCAINE HCL (CARDIAC) PF 100 MG/5ML IV SOSY
PREFILLED_SYRINGE | INTRAVENOUS | Status: DC | PRN
  Administered 2018-07-29: 40 mg via INTRAVENOUS

## 2018-07-29 MED ORDER — LACTATED RINGERS IV SOLN
INTRAVENOUS | Status: DC
  Administered 2018-07-29: 08:00:00 via INTRAVENOUS

## 2018-07-29 MED ORDER — PROPOFOL 10 MG/ML IV BOLUS
INTRAVENOUS | Status: DC | PRN
  Administered 2018-07-29: 30 mg via INTRAVENOUS
  Administered 2018-07-29: 20 mg via INTRAVENOUS
  Administered 2018-07-29: 50 mg via INTRAVENOUS
  Administered 2018-07-29: 30 mg via INTRAVENOUS
  Administered 2018-07-29: 20 mg via INTRAVENOUS
  Administered 2018-07-29: 30 mg via INTRAVENOUS
  Administered 2018-07-29: 20 mg via INTRAVENOUS
  Administered 2018-07-29: 30 mg via INTRAVENOUS
  Administered 2018-07-29: 20 mg via INTRAVENOUS
  Administered 2018-07-29: 100 mg via INTRAVENOUS

## 2018-07-29 SURGICAL SUPPLY — 8 items
CANISTER SUCT 1200ML W/VALVE (MISCELLANEOUS) ×2 IMPLANT
FORCEPS BIOP RAD 4 LRG CAP 4 (CUTTING FORCEPS) ×2 IMPLANT
GOWN CVR UNV OPN BCK APRN NK (MISCELLANEOUS) ×2 IMPLANT
GOWN ISOL THUMB LOOP REG UNIV (MISCELLANEOUS) ×2
KIT ENDO PROCEDURE OLY (KITS) ×2 IMPLANT
SNARE SHORT THROW 13M SML OVAL (MISCELLANEOUS) ×2 IMPLANT
TRAP ETRAP POLY (MISCELLANEOUS) ×2 IMPLANT
WATER STERILE IRR 250ML POUR (IV SOLUTION) ×2 IMPLANT

## 2018-07-29 NOTE — Op Note (Signed)
[facility] Gastroenterology Patient Name: Todd Hutchinson Procedure Date: 07/29/2018 8:14 AM MRN: [id number] Account #: [id number] Date of Birth: 04-Dec-1950 Admit Type: Outpatient Age: 67 Room: [facility] OR ROOM 01 Gender: Male Note Status: Finalized Procedure:            Colonoscopy Indications:          High risk colon cancer surveillance: Personal history                        of colonic polyps Providers:            Lucilla Lame MD, MD Referring MD:         Arnetha Courser (Referring MD) Medicines:            Propofol per Anesthesia Complications:        No immediate complications. Procedure:            Pre-Anesthesia Assessment:                       - Prior to the procedure, a History and Physical was                        performed, and patient medications and allergies were                        reviewed. The patient's tolerance of previous                        anesthesia was also reviewed. The risks and benefits of                        the procedure and the sedation options and risks were                        discussed with the patient. All questions were                        answered, and informed consent was obtained. Prior                        Anticoagulants: The patient has taken no previous                        anticoagulant or antiplatelet agents. ASA Grade                        Assessment: II - A patient with mild systemic disease.                        After reviewing the risks and benefits, the patient was                        deemed in satisfactory condition to undergo the                        procedure.                       After obtaining informed consent, the colonoscope was  passed under direct vision. Throughout the procedure,                        the patient's blood pressure, pulse, and oxygen                        saturations were monitored continuously. The                        Colonoscope was  introduced through the anus and                        advanced to the the cecum, identified by appendiceal                        orifice and ileocecal valve. The colonoscopy was                        performed without difficulty. The patient tolerated the                        procedure well. The quality of the bowel preparation                        was excellent. Findings:      The perianal and digital rectal examinations were normal.      A 2 mm polyp was found in the cecum. The polyp was sessile. The polyp       was removed with a cold biopsy forceps. Resection and retrieval were       complete.      A 6 mm polyp was found in the ascending colon. The polyp was sessile.       The polyp was removed with a cold snare. Resection and retrieval were       complete.      Three sessile polyps were found in the rectum. The polyps were 3 to 4 mm       in size. These polyps were removed with a cold snare. Resection and       retrieval were complete.      Multiple small-mouthed diverticula were found in the sigmoid colon. Impression:           - One 2 mm polyp in the cecum, removed with a cold                        biopsy forceps. Resected and retrieved.                       - One 6 mm polyp in the ascending colon, removed with a                        cold snare. Resected and retrieved.                       - Three 3 to 4 mm polyps in the rectum, removed with a                        cold snare. Resected and retrieved.                       -  Diverticulosis in the sigmoid colon. Recommendation:       - Discharge patient to home.                       - Resume previous diet.                       - Continue present medications.                       - Await pathology results.                       - Repeat colonoscopy in 5 years for surveillance. Procedure Code(s):    --- Professional ---                       [phone], Colonoscopy, flexible; with removal of tumor(s),                         polyp(s), or other lesion(s) by snare technique                       [id number], 40, Colonoscopy, flexible; with biopsy, single                        or multiple Diagnosis Code(s):    --- Professional ---                       Z86.010, Personal history of colonic polyps                       D12.0, Benign neoplasm of cecum                       D12.2, Benign neoplasm of ascending colon CPT copyright 2018 American Medical Association. All rights reserved. The codes documented in this report are preliminary and upon coder review may  be revised to meet current compliance requirements. Lucilla Lame MD, MD 07/29/2018 8:45:38 AM This report has been signed electronically. Number of Addenda: 0 Note Initiated On: 07/29/2018 8:14 AM Scope Withdrawal Time: 0 hours 11 minutes 32 seconds  Total Procedure Duration: 0 hours 16 minutes 33 seconds       [facility]

## 2018-07-29 NOTE — Anesthesia Procedure Notes (Signed)
Date/Time: 07/29/2018 8:16 AM Performed by: Janna Arch, CRNA Pre-anesthesia Checklist: Patient identified, Emergency Drugs available, Suction available, Timeout performed and Patient being monitored Patient Re-evaluated:Patient Re-evaluated prior to induction Oxygen Delivery Method: Nasal cannula Placement Confirmation: positive ETCO2

## 2018-07-29 NOTE — Anesthesia Preprocedure Evaluation (Signed)
Anesthesia Evaluation  Patient identified by MRN, date of birth, ID band  Airway Mallampati: II  TM Distance: <3 FB Neck ROM: Full    Dental   Pulmonary sleep apnea , COPD, former smoker,    Pulmonary exam normal        Cardiovascular hypertension, Normal cardiovascular exam     Neuro/Psych    GI/Hepatic   Endo/Other  Morbid obesity  Renal/GU      Musculoskeletal   Abdominal   Peds  Hematology   Anesthesia Other Findings   Reproductive/Obstetrics                             Anesthesia Physical Anesthesia Plan  ASA: III  Anesthesia Plan: General   Post-op Pain Management:    Induction:   PONV Risk Score and Plan:   Airway Management Planned: Natural Airway  Additional Equipment:   Intra-op Plan:   Post-operative Plan:   Informed Consent:   Plan Discussed with: CRNA  Anesthesia Plan Comments:         Anesthesia Quick Evaluation

## 2018-07-29 NOTE — Transfer of Care (Signed)
Immediate Anesthesia Transfer of Care Note  Patient: Todd Hutchinson  Procedure(s) Performed: COLONOSCOPY WITH BIOPSIES (N/A Rectum) POLYPECTOMY (N/A Rectum)  Patient Location: PACU  Anesthesia Type: General  Level of Consciousness: awake, alert  and patient cooperative  Airway and Oxygen Therapy: Patient Spontanous Breathing and Patient connected to supplemental oxygen  Post-op Assessment: Post-op Vital signs reviewed, Patient's Cardiovascular Status Stable, Respiratory Function Stable, Patent Airway and No signs of Nausea or vomiting  Post-op Vital Signs: Reviewed and stable  Complications: No apparent anesthesia complications

## 2018-07-29 NOTE — Anesthesia Postprocedure Evaluation (Signed)
Anesthesia Post Note  Patient: Todd Hutchinson  Procedure(s) Performed: COLONOSCOPY WITH BIOPSIES (N/A Rectum) POLYPECTOMY (N/A Rectum)  Patient location during evaluation: PACU Anesthesia Type: General Level of consciousness: awake and alert Pain management: pain level controlled Vital Signs Assessment: post-procedure vital signs reviewed and stable Respiratory status: spontaneous breathing, nonlabored ventilation, respiratory function stable and patient connected to nasal cannula oxygen Cardiovascular status: blood pressure returned to baseline and stable Postop Assessment: no apparent nausea or vomiting Anesthetic complications: no    Marshell Levan

## 2018-07-29 NOTE — H&P (Addendum)
Lucilla Lame, MD [address] [place], [place] [address] Phone:[phone] Fax : [phone]  Primary Care Physician:  Arnetha Courser, MD Primary Gastroenterologist:  Dr. Allen Norris  Pre-Procedure History & Physical: HPI:  Todd Hutchinson is a 67 y.o. male is here for an colonoscopy.   Past Medical History:  Diagnosis Date  . Abnormal weight gain 03/13/2016  . COPD (chronic obstructive pulmonary disease) ([place])   . Hx of malignant neoplasm of prostate 05/31/2012  . Hx of tobacco use, presenting hazards to health 04/17/2016  . Hypercholesterolemia 03/13/2016  . Hyperlipidemia   . Hypertension   . Hypertensive nephrosclerosis 01/27/2018  . Prostate cancer ([place])   . Sleep apnea    uses CPAP    Past Surgical History:  Procedure Laterality Date  . PROSTATE SURGERY      Prior to Admission medications   Medication Sig Start Date End Date Taking? Authorizing Provider  amLODipine-benazepril (LOTREL) 10-40 MG capsule Take 1 capsule by mouth daily. 01/05/18  Yes Lada, Satira Anis, MD  carvedilol (COREG) 6.25 MG tablet TAKE 1 TABLET TWICE A DAY 07/06/18  Yes Poulose, Bethel Born, NP  chlorthalidone (HYGROTON) 25 MG tablet Take 1 tablet (25 mg total) by mouth daily. 01/05/18  Yes Lada, Satira Anis, MD  Cholecalciferol (VITAMIN D-3) 1000 units CAPS Take 1 capsule (1,000 Units total) by mouth daily. 04/15/17  Yes Lada, Satira Anis, MD  Omega-3 Fatty Acids (FISH OIL) 1000 MG CAPS Take 1,000 mg by mouth daily.   Yes [provider]  polyethylene glycol (GOLYTELY) 236 g solution Drink one 8 oz glass every 20 mins until entire container if finished starting at 5:00pm on 07/28/18. 07/26/18  Yes Charleston Hankin, MD  pravastatin (PRAVACHOL) 40 MG tablet TAKE 1 TABLET AT BEDTIME 07/06/18  Yes Poulose, Bethel Born, NP  albuterol (PROAIR HFA) 108 (90 BASE) MCG/ACT inhaler  02/08/15   [provider]    Allergies as of 07/06/2018 - Review Complete 07/02/2018  Allergen Reaction  Noted  . Atorvastatin Rash 04/10/2015    Family History  Problem Relation Age of Onset  . Hypertension Mother   . Aneurysm Mother 30       brain  . Hypertension Father   . Heart attack Father   . Hypertension Sister   . Hypertension Brother   . Heart failure Brother   . Heart disease Paternal Grandfather   . Hypertension Sister   . Hypertension Brother   . Cancer Brother        throat cancer    Social History   Socioeconomic History  . Marital status: Married    Spouse name: Dina Rich   . Number of children: 1  . Years of education: Not on file  . Highest education level: 12th grade  Occupational History    Comment: Retired   Scientific laboratory technician  . Financial resource strain: Not hard at all  . Food insecurity:    Worry: Never true    Inability: Never true  . Transportation needs:    Medical: No    Non-medical: No  Tobacco Use  . Smoking status: Former Smoker    Packs/day: 1.00    Years: 40.00    Pack years: 40.00  . Smokeless tobacco: Never Used  Substance and Sexual Activity  . Alcohol use: Yes    Alcohol/week: 16.0 standard drinks    Types: 16 Shots of liquor per week    Comment: no more than 4 whiskey drinks a day; maybe 4  times day   . Drug use: No  . Sexual activity: Never    Partners: Female  Lifestyle  . Physical activity:    Days per week: 6 days    Minutes per session: 30 min  . Stress: Not at all  Relationships  . Social connections:    Talks on phone: More than three times a week    Gets together: Once a week    Attends religious service: More than 4 times per year    Active member of club or organization: No    Attends meetings of clubs or organizations: Never    Relationship status: Married  . Intimate partner violence:    Fear of current or ex partner: No    Emotionally abused: No    Physically abused: No    Forced sexual activity: No  Other Topics Concern  . Not on file  Social History Narrative  . Not on file    Review of  Systems: See HPI, otherwise negative ROS  Physical Exam: BP (!) 159/75   Pulse 90   Temp 99 F (37.2 C) (Temporal)   Resp 18   Ht 5\' 7"  (1.702 m)   Wt 108.9 kg   SpO2 97%   BMI 37.59 kg/m  General:   Alert,  pleasant and cooperative in NAD Head:  Normocephalic and atraumatic. Neck:  Supple; no masses or thyromegaly. Lungs:  Clear throughout to auscultation.    Heart:  Regular rate and rhythm. Abdomen:  Soft, nontender and nondistended. Normal bowel sounds, without guarding, and without rebound.   Neurologic:  Alert and  oriented x4;  grossly normal neurologically.  Impression/Plan: Todd Hutchinson is here for an colonoscopy to be performed for history of polyps last colonoscopy 03/2013.  Risks, benefits, limitations, and alternatives regarding  colonoscopy have been reviewed with the patient.  Questions have been answered.  All parties agreeable.   Lucilla Lame, MD  07/29/2018, 7:37 AM

## 2018-07-30 ENCOUNTER — Encounter: Payer: Self-pay | Admitting: Gastroenterology

## 2018-08-04 DIAGNOSIS — G4733 Obstructive sleep apnea (adult) (pediatric): Secondary | ICD-10-CM | POA: Diagnosis not present

## 2018-08-09 DIAGNOSIS — G4733 Obstructive sleep apnea (adult) (pediatric): Secondary | ICD-10-CM | POA: Diagnosis not present

## 2018-08-10 ENCOUNTER — Ambulatory Visit: Payer: Medicare HMO | Admitting: Urology

## 2018-08-10 ENCOUNTER — Encounter: Payer: Self-pay | Admitting: Urology

## 2018-08-10 VITALS — BP 161/80 | HR 86 | Ht 67.0 in | Wt 244.3 lb

## 2018-08-10 DIAGNOSIS — C61 Malignant neoplasm of prostate: Secondary | ICD-10-CM

## 2018-08-10 NOTE — Progress Notes (Addendum)
08/10/2018 10:14 AM   Todd Hutchinson 22-Jul-1951 [id number]  Referring provider: Arnetha Courser, MD [address] [place], [place] [address]  CC: History of prostate cancer  HPI: I saw [name] in urology clinic today for history of prostate cancer from Dr. Jacinto Reap.  He is a 67 year old African-American male who was previously followed by Dr. Jacqlyn Larsen.  On chart review, he underwent an open radical prostatectomy with Dr. Jacqlyn Larsen in 2000 at [facility], then reportedly underwent radiation in 2006 for PSA recurrence, and has been on Lupron for at least 5 years.  Unfortunately his pathology report and PSA at diagnosis are unavailable to me.  The patient is unsure as well.  He has very mild stress urinary incontinence when he is doing strenuous yard work.  He has not gotten any erections since starting Lupron.  He denies any fatigue or hot flashes secondary to Lupron.  Last Lupron injection was July 2019.  On reviewing his PSA trend, his PSA goes back to October 2014 when it was undetectable.  It remained undetectable until December 2017 when it was 0.13.  It was 0.2 in March 2018, 0.1 in June 2018, 0.12 in December 2018, and most recently 0.13 in July 2019.  He has been on Lupron injections every 6 months.  There are no aggravating or alleviating factors.  Duration is since 2000.  Severity is moderate.  He denies any weight loss or bone pain.   PMH: Past Medical History:  Diagnosis Date  . Abnormal weight gain 03/13/2016  . COPD (chronic obstructive pulmonary disease) ([place])   . Hx of malignant neoplasm of prostate 05/31/2012  . Hx of tobacco use, presenting hazards to health 04/17/2016  . Hypercholesterolemia 03/13/2016  . Hyperlipidemia   . Hypertension   . Hypertensive nephrosclerosis 01/27/2018  . Prostate cancer ([place])   . Sleep apnea    uses CPAP    Surgical History: Past Surgical History:  Procedure Laterality Date  . COLONOSCOPY WITH PROPOFOL N/A 07/29/2018   Procedure:  COLONOSCOPY WITH BIOPSIES;  Surgeon: Lucilla Lame, MD;  Location: [place];  Service: Endoscopy;  Laterality: N/A;  . POLYPECTOMY N/A 07/29/2018   Procedure: POLYPECTOMY;  Surgeon: Lucilla Lame, MD;  Location: [place];  Service: Endoscopy;  Laterality: N/A;  . PROSTATE SURGERY       Allergies:  Allergies  Allergen Reactions  . Atorvastatin Rash    Family History: Family History  Problem Relation Age of Onset  . Hypertension Mother   . Aneurysm Mother 32       brain  . Hypertension Father   . Heart attack Father   . Hypertension Sister   . Hypertension Brother   . Heart failure Brother   . Heart disease Paternal Grandfather   . Hypertension Sister   . Hypertension Brother   . Cancer Brother        throat cancer  . Kidney cancer Neg Hx   . Kidney failure Neg Hx   . Prostate cancer Neg Hx   . Sickle cell anemia Neg Hx   . Tuberculosis Neg Hx     Social History:  reports that he has quit smoking. He has a 40.00 pack-year smoking history. He has never used smokeless tobacco. He reports that he drinks about 16.0 standard drinks of alcohol per week. He reports that he does not use drugs.  ROS: Please see flowsheet from today's date for complete review of systems.  Physical Exam: BP (!) 161/80 (BP  Location: Left Arm, Patient Position: Sitting, Cuff Size: Large)   Pulse 86   Ht 5\' 7"  (1.702 m)   Wt 244 lb 4.8 oz (110.8 kg)   BMI 38.26 kg/m    Constitutional:  Alert and oriented, No acute distress. Cardiovascular: No clubbing, cyanosis, or edema. Respiratory: Normal respiratory effort, no increased work of breathing. GI: Abdomen is soft, nontender, nondistended, no abdominal masses GU: No CVA tenderness Lymph: No cervical or inguinal lymphadenopathy. Skin: No rashes, bruises or suspicious lesions. Neurologic: Grossly intact, no focal deficits, moving all 4 extremities. Psychiatric: Normal mood and affect.  Laboratory Data: Reviewed, see HPI for  PSA trend  Pertinent Imaging: None to review  Assessment & Plan:   In summary, [name] is a 67 year old African-American male with history of open radical prostatectomy in 2000, salvage radiation in 2006, and long-term Lupron androgen deprivation therapy.  His PSA trend since 2014 has remained essentially undetectable.  I recommended obtaining his pathology report and prior records from his radiation prior to resuming Lupron.  Patient and his wife are in agreement  RTC January 2020 with PSA  ADDENDUM: Path report obtained. Open prostatectomy 06/1999: GS 3+4=7, negative margins, no extracapsular extension, +perineural invasion, LN's negative.  Billey Co, [facility] [address], [place], [place] [address] [phone]

## 2018-08-18 ENCOUNTER — Encounter: Payer: Self-pay | Admitting: Gastroenterology

## 2018-08-19 ENCOUNTER — Encounter: Payer: Self-pay | Admitting: Gastroenterology

## 2018-09-03 DIAGNOSIS — G4733 Obstructive sleep apnea (adult) (pediatric): Secondary | ICD-10-CM | POA: Diagnosis not present

## 2018-09-10 DIAGNOSIS — G4733 Obstructive sleep apnea (adult) (pediatric): Secondary | ICD-10-CM | POA: Diagnosis not present

## 2018-09-17 ENCOUNTER — Other Ambulatory Visit: Payer: Self-pay | Admitting: Family Medicine

## 2018-09-17 DIAGNOSIS — I10 Essential (primary) hypertension: Secondary | ICD-10-CM

## 2018-10-04 DIAGNOSIS — G4733 Obstructive sleep apnea (adult) (pediatric): Secondary | ICD-10-CM | POA: Diagnosis not present

## 2018-10-05 DIAGNOSIS — R69 Illness, unspecified: Secondary | ICD-10-CM | POA: Diagnosis not present

## 2018-10-06 ENCOUNTER — Other Ambulatory Visit: Payer: Self-pay | Admitting: Family Medicine

## 2018-10-08 ENCOUNTER — Other Ambulatory Visit: Payer: Medicare HMO

## 2018-10-08 DIAGNOSIS — C61 Malignant neoplasm of prostate: Secondary | ICD-10-CM

## 2018-10-09 LAB — PSA: Prostate Specific Ag, Serum: 0.2 ng/mL (ref 0.0–4.0)

## 2018-10-11 DIAGNOSIS — G4733 Obstructive sleep apnea (adult) (pediatric): Secondary | ICD-10-CM | POA: Diagnosis not present

## 2018-10-12 ENCOUNTER — Encounter: Payer: Self-pay | Admitting: Urology

## 2018-10-12 ENCOUNTER — Ambulatory Visit: Payer: Medicare HMO | Admitting: Urology

## 2018-10-12 VITALS — BP 164/72 | HR 84 | Ht 67.0 in | Wt 243.0 lb

## 2018-10-12 DIAGNOSIS — C61 Malignant neoplasm of prostate: Secondary | ICD-10-CM

## 2018-10-12 NOTE — Progress Notes (Signed)
   10/12/2018 10:45 AM   [name] Sep 01, 1951 [id number]  Reason for visit: Follow up prostate cancer  HPI: I saw Mr. Tollie Pizza back in urology clinic today for history of prostate cancer.  To briefly summarize, he is a 68 year old African-American male who was previously managed by Dr. Jacqlyn Larsen for prostate cancer.  He underwent an open prostatectomy in 2000 at [facility] with final pathology showing Gleason score 3+4 = 7 with negative margins, no extracapsular extension, and negative lymph nodes.  He then underwent reported radiation in 2006 for PSA recurrence, but these records are unavailable to me.  He has then been on Lupron for at least 5 years.  His PSAs on Lupron have ranged from 0.12-0.2.  At her previous visit, we had discussed stopping Lupron therapy with is unclear indication and known significant side effect profile of Lupron.  He has not had any erections since undergoing prostatectomy with radiation.  He has not had a Lupron injection for over 6 months, and PSA is stable at 0.2.  ROS: Please see flowsheet from today's date for complete review of systems.  Physical Exam: BP (!) 164/72   Pulse 84   Ht 5\' 7"  (1.702 m)   Wt 243 lb (110.2 kg)   BMI 38.06 kg/m    Constitutional:  Alert and oriented, No acute distress. Respiratory: Normal respiratory effort, no increased work of breathing. GI: Abdomen is soft, nontender, nondistended, no abdominal masses GU: No CVA tenderness Skin: No rashes, bruises or suspicious lesions. Neurologic: Grossly intact, no focal deficits, moving all 4 extremities. Psychiatric: Normal mood and affect  Laboratory Data: PSA 09/2018: 0.2  Assessment & Plan:    1. Prostate cancer [facility]) In summary, Mr. Phillippi is a 69 year old African-American male with history of open radical prostatectomy in 2000 with final pathology showing Gleason score 3+4 = 7 with negative margins and no extraprostatic extension, salvage radiation in 2006 for biochemical  recurrence, and long-term Lupron androgen deprivation therapy.  His PSA trend since 2014 has remained essentially undetectable on Lupron from 0-0.2.  PSA remains stably low at 0.2 with patient off Lupron.  We discussed the risks and benefits of ADT at length, and he is amenable to a trial off of Lupron with close PSA monitoring  Repeat PSA in 3 months, call with results.  If remains low continue off Lupron and check PSA every 6 months(patient prefers every 6 months versus yearly).   If PSA rises significantly, consider reinitiating Lupron versus referral to medical oncology.  A total of 15 minutes were spent face-to-face with the patient, greater than 50% was spent in patient education, counseling, and coordination of care regarding history of prostate cancer, ADT, biochemical recurrence, and PSA surveillance.   Billey Co, [facility] [address] [place], [place] [address] [phone]

## 2018-10-13 DIAGNOSIS — H2513 Age-related nuclear cataract, bilateral: Secondary | ICD-10-CM | POA: Diagnosis not present

## 2018-10-13 DIAGNOSIS — E119 Type 2 diabetes mellitus without complications: Secondary | ICD-10-CM | POA: Diagnosis not present

## 2018-10-13 DIAGNOSIS — H35033 Hypertensive retinopathy, bilateral: Secondary | ICD-10-CM | POA: Diagnosis not present

## 2018-10-25 ENCOUNTER — Other Ambulatory Visit: Payer: Self-pay | Admitting: Family Medicine

## 2018-10-25 DIAGNOSIS — I10 Essential (primary) hypertension: Secondary | ICD-10-CM

## 2018-11-02 ENCOUNTER — Encounter: Payer: Self-pay | Admitting: Family Medicine

## 2018-11-02 ENCOUNTER — Ambulatory Visit (INDEPENDENT_AMBULATORY_CARE_PROVIDER_SITE_OTHER): Payer: Medicare HMO | Admitting: Family Medicine

## 2018-11-02 ENCOUNTER — Telehealth: Payer: Self-pay | Admitting: Family Medicine

## 2018-11-02 VITALS — BP 128/70 | HR 96 | Temp 98.9°F | Ht 67.0 in | Wt 244.2 lb

## 2018-11-02 DIAGNOSIS — R7303 Prediabetes: Secondary | ICD-10-CM | POA: Diagnosis not present

## 2018-11-02 DIAGNOSIS — I129 Hypertensive chronic kidney disease with stage 1 through stage 4 chronic kidney disease, or unspecified chronic kidney disease: Secondary | ICD-10-CM | POA: Diagnosis not present

## 2018-11-02 DIAGNOSIS — Z8546 Personal history of malignant neoplasm of prostate: Secondary | ICD-10-CM

## 2018-11-02 DIAGNOSIS — Z87891 Personal history of nicotine dependence: Secondary | ICD-10-CM | POA: Diagnosis not present

## 2018-11-02 DIAGNOSIS — N183 Chronic kidney disease, stage 3 unspecified: Secondary | ICD-10-CM

## 2018-11-02 DIAGNOSIS — Z7289 Other problems related to lifestyle: Secondary | ICD-10-CM | POA: Diagnosis not present

## 2018-11-02 DIAGNOSIS — Z789 Other specified health status: Secondary | ICD-10-CM

## 2018-11-02 DIAGNOSIS — E7800 Pure hypercholesterolemia, unspecified: Secondary | ICD-10-CM

## 2018-11-02 NOTE — Assessment & Plan Note (Signed)
Encouraged weight loss, healthy eating; look at saturated fats on fast food menu information; continue statin; check lipids in April; goal LDL less than 70; offered bloodwork today versus him buckling down and losing weight; he opted for no labs today and buckling down and rechecking in 2 months

## 2018-11-02 NOTE — Progress Notes (Signed)
BP 128/70   Pulse 96   Temp 98.9 F (37.2 C)   Ht 5\' 7"  (1.702 m)   Wt 244 lb 3.2 oz (110.8 kg)   SpO2 97%   BMI 38.25 kg/m    Subjective:    Patient ID: Todd Hutchinson, male    DOB: Apr 28, 1951, 68 y.o.   MRN: [id number]  HPI: Todd Hutchinson is a 68 y.o. male  Chief Complaint  Patient presents with  . Follow-up    HPI HTN Checking BP at home; abaout the same as here; not adding salt at the table; some seasoning with salt when cooking; wife starting to pay attention to labels  Obesity; last time 180 or 190 pounds was high school; gradual over the years weight gain; interested in losing weight; some of his hurdles include lack of exercise; likes to walk, tries to walk some and can try to walk more; very little snacking; very little fast food, once every 2 weeks or so; usually hamburgers; no chest pain  High cholesterol; limiting red meat, 2x a week; likes cheese, not much; likes eggs on the weekend; just a little aching from age, not severe myalgias  Lab Results  Component Value Date   CHOL 170 07/02/2018   HDL 51 07/02/2018   [place] 93 07/02/2018   TRIG 158 (H) 07/02/2018   CHOLHDL 3.3 07/02/2018   He is seeing Dr. Diamantina Providence for his PSA, he stopped the lupron shot; should help with weight  Prediabetes; last A1c 5.9 in October; no fam hx of diabetes; no dry mouth  He cannot take aspirin, seeing Dr. Juleen China for his kidneys; they stopped his aspirin products and decreased the chlorthalidone  The 10-year ASCVD risk score Mikey Bussing DC Brooke Bonito., et al., 2013) is: 15.8%   Values used to calculate the score:     Age: 59 years     Sex: Male     Is Non-Hispanic African American: Yes     Diabetic: No     Tobacco smoker: No     Systolic Blood Pressure: 277 mmHg     Is BP treated: Yes     HDL Cholesterol: 51 mg/dL     Total Cholesterol: 170 mg/dL  CKD; seeing Dr. Juleen China and low vit D, on supplement  OSA; wearing CPAP; feels better when wearing it  Depression  [facility] 2/9 11/02/2018 07/02/2018 06/30/2017 10/15/2016 04/17/2016  Decreased Interest 0 0 0 0 0  Down, Depressed, Hopeless 0 0 0 0 0  PHQ - 2 Score 0 0 0 0 0  Altered sleeping 0 - - - -  Tired, decreased energy 0 - - - -  Change in appetite 0 - - - -  Feeling bad or failure about yourself  0 - - - -  Trouble concentrating 0 - - - -  Moving slowly or fidgety/restless 0 - - - -  Suicidal thoughts 0 - - - -  PHQ-9 Score 0 - - - -  Difficult doing work/chores Not difficult at all - - - -   Fall Risk  11/02/2018 07/02/2018 06/30/2017 10/15/2016 03/13/2016  Falls in the past year? 0 No No No No    Relevant past medical, surgical, family and social history reviewed Past Medical History:  Diagnosis Date  . Abnormal weight gain 03/13/2016  . COPD (chronic obstructive pulmonary disease) ([place])   . Hx of malignant neoplasm of prostate 05/31/2012  . Hx of tobacco use, presenting hazards to health 04/17/2016  . Hypercholesterolemia  03/13/2016  . Hyperlipidemia   . Hypertension   . Hypertensive nephrosclerosis 01/27/2018  . Prostate cancer ([place])   . Sleep apnea    uses CPAP   Past Surgical History:  Procedure Laterality Date  . COLONOSCOPY WITH PROPOFOL N/A 07/29/2018   Procedure: COLONOSCOPY WITH BIOPSIES;  Surgeon: Lucilla Lame, MD;  Location: [facility];  Service: Endoscopy;  Laterality: N/A;  . POLYPECTOMY N/A 07/29/2018   Procedure: POLYPECTOMY;  Surgeon: Lucilla Lame, MD;  Location: [facility];  Service: Endoscopy;  Laterality: N/A;  . PROSTATE SURGERY     Family History  Problem Relation Age of Onset  . Hypertension Mother   . Aneurysm Mother 36       brain  . Hypertension Father   . Heart attack Father   . Hypertension Sister   . Hypertension Brother   . Heart failure Brother   . Heart disease Paternal Grandfather   . Hypertension Sister   . Hypertension Brother   . Cancer Brother        throat cancer  . Kidney cancer Neg Hx   . Kidney failure Neg Hx   .  Prostate cancer Neg Hx   . Sickle cell anemia Neg Hx   . Tuberculosis Neg Hx    Social History   Tobacco Use  . Smoking status: Former Smoker    Packs/day: 1.00    Years: 40.00    Pack years: 40.00    Last attempt to quit: 11/2013    Years since quitting: 4.9  . Smokeless tobacco: Never Used  Substance Use Topics  . Alcohol use: Yes    Alcohol/week: 16.0 standard drinks    Types: 16 Shots of liquor per week    Comment: no more than 4 whiskey drinks a day; maybe 4 times day   . Drug use: No     Office Visit from 11/02/2018 in [facility]  AUDIT-C Score  5      Interim medical history since last visit reviewed. Allergies and medications reviewed  Review of Systems  Respiratory: Negative for shortness of breath.   Cardiovascular: Negative for chest pain.   Per HPI unless specifically indicated above     Objective:    BP 128/70   Pulse 96   Temp 98.9 F (37.2 C)   Ht 5\' 7"  (1.702 m)   Wt 244 lb 3.2 oz (110.8 kg)   SpO2 97%   BMI 38.25 kg/m   Wt Readings from Last 3 Encounters:  11/02/18 244 lb 3.2 oz (110.8 kg)  10/12/18 243 lb (110.2 kg)  08/10/18 244 lb 4.8 oz (110.8 kg)    Physical Exam Constitutional:      General: He is not in acute distress.    Appearance: He is well-developed. He is obese.  HENT:     Head: Normocephalic and atraumatic.  Eyes:     General: No scleral icterus. Neck:     Thyroid: No thyromegaly.  Cardiovascular:     Rate and Rhythm: Normal rate and regular rhythm.  Pulmonary:     Effort: Pulmonary effort is normal.     Breath sounds: Normal breath sounds.  Abdominal:     General: Bowel sounds are normal. There is no distension.     Palpations: Abdomen is soft.  Musculoskeletal:     Right lower leg: No edema.     Left lower leg: No edema.  Skin:    General: Skin is warm and dry.  Coloration: Skin is not pale.  Neurological:     Mental Status: He is alert.     Coordination: Coordination normal.    Psychiatric:        Behavior: Behavior normal.        Thought Content: Thought content normal.        Judgment: Judgment normal.     Results for orders placed or performed in visit on 10/08/18  PSA  Result Value Ref Range   Prostate Specific Ag, Serum 0.2 0.0 - 4.0 ng/mL      Assessment & Plan:   Problem List Items Addressed This Visit      Cardiovascular and Mediastinum   Hypertensive nephrosclerosis - Primary    Controlled BP today on four agents; limit sodium; weight loss encouraged        Genitourinary   Chronic kidney disease, stage III (moderate) ([place])    Seeing nephrologist; patient tells me that nephrologist told him no aspirin or aspirin products        Other   Prediabetes    Encouraged weight loss; check A1c in April      Morbid obesity ([place])    Lose one pound at a time, slowly and gradually, walk more and build up gradually; check menus, stay hydrated      Hypercholesterolemia    Encouraged weight loss, healthy eating; look at saturated fats on fast food menu information; continue statin; check lipids in April; goal LDL less than 70; offered bloodwork today versus him buckling down and losing weight; he opted for no labs today and buckling down and rechecking in 2 months      Hx of tobacco use, presenting hazards to health    Order chest CT for screening      Relevant Orders   CT CHEST LUNG CA SCREEN LOW DOSE W/O CM   Hx of malignant neoplasm of prostate    Managed by urologist       Other Visit Diagnoses    Alcohol use       not addressed during visit; see phone call immediately after       Follow up plan: Return in about 2 months (around 01/03/2019) for labs, appointment with Dr. Sanda Klein a few days later.  An after-visit summary was printed and given to the patient at [facility] [facility].  Please see the patient instructions which may contain other information and recommendations beyond what is mentioned above in the assessment and plan.  No orders of  the defined types were placed in this encounter.   Orders Placed This Encounter  Procedures  . CT CHEST LUNG CA SCREEN LOW DOSE W/O CM

## 2018-11-02 NOTE — Assessment & Plan Note (Signed)
Lose one pound at a time, slowly and gradually, walk more and build up gradually; check menus, stay hydrated

## 2018-11-02 NOTE — Telephone Encounter (Signed)
Pt notified. Stated he does not want to do a chest CT at this time and stated he does not need any resources for cutting back on alcohol.

## 2018-11-02 NOTE — Assessment & Plan Note (Signed)
Order chest CT for screening

## 2018-11-02 NOTE — Patient Instructions (Addendum)
Try to lose 1 pound per week, aiming for 231 pounds in 3 months Check out the information at familydoctor.org entitled "Nutrition for Weight Loss: What You Need to Know about Fad Diets" Try to lose between 1-2 pounds per week by taking in fewer calories and burning off more calories You can succeed by limiting portions, limiting foods dense in calories and fat, becoming more active, and drinking 8 glasses of water a day (64 ounces) Don't skip meals, especially breakfast, as skipping meals may alter your metabolism Do not use over-the-counter weight loss pills or gimmicks that claim rapid weight loss A healthy BMI (or body mass index) is between 18.5 and 24.9 You can calculate your ideal BMI at the [place] website ClubMonetize.fr  I'll recommend no more than 3 egg yolks per week   Obesity, Adult Obesity is the condition of having too much total body fat. Being overweight or obese means that your weight is greater than what is considered healthy for your body size. Obesity is determined by a measurement called BMI. BMI is an estimate of body fat and is calculated from height and weight. For adults, a BMI of 30 or higher is considered obese. Obesity can eventually lead to other health concerns and major illnesses, including:  Stroke.  Coronary artery disease (CAD).  Type 2 diabetes.  Some types of cancer, including cancers of the colon, breast, uterus, and gallbladder.  Osteoarthritis.  High blood pressure (hypertension).  High cholesterol.  Sleep apnea.  Gallbladder stones.  Infertility problems. What are the causes? The main cause of obesity is taking in (consuming) more calories than your body uses for energy. Other factors that contribute to this condition may include:  Being born with genes that make you more likely to become obese.  Having a medical condition that causes obesity. These conditions  include: ? Hypothyroidism. ? Polycystic ovarian syndrome (PCOS). ? Binge-eating disorder. ? Cushing syndrome.  Taking certain medicines, such as steroids, antidepressants, and seizure medicines.  Not being physically active (sedentary lifestyle).  Living where there are limited places to exercise safely or buy healthy foods.  Not getting enough sleep. What increases the risk? The following factors may increase your risk of this condition:  Having a family history of obesity.  Being a woman of African-American descent.  Being a man of Hispanic descent. What are the signs or symptoms? Having excessive body fat is the main symptom of this condition. How is this diagnosed? This condition may be diagnosed based on:  Your symptoms.  Your medical history.  A physical exam. Your health care provider may measure: ? Your BMI. If you are an adult with a BMI between 25 and less than 30, you are considered overweight. If you are an adult with a BMI of 30 or higher, you are considered obese. ? The distances around your hips and your waist (circumferences). These may be compared to each other to help diagnose your condition. ? Your skinfold thickness. Your health care provider may gently pinch a fold of your skin and measure it. How is this treated? Treatment for this condition often includes changing your lifestyle. Treatment may include some or all of the following:  Dietary changes. Work with your health care provider and a dietitian to set a weight-loss goal that is healthy and reasonable for you. Dietary changes may include eating: ? Smaller portions. A portion size is the amount of a particular food that is healthy for you to eat at one time. This varies from  person to person. ? Low-calorie or low-fat options. ? More whole grains, fruits, and vegetables.  Regular physical activity. This may include aerobic activity (cardio) and strength training.  Medicine to help you lose weight.  Your health care provider may prescribe medicine if you are unable to lose 1 pound a week after 6 weeks of eating more healthily and doing more physical activity.  Surgery. Surgical options may include gastric banding and gastric bypass. Surgery may be done if: ? Other treatments have not helped to improve your condition. ? You have a BMI of 40 or higher. ? You have life-threatening health problems related to obesity. Follow these instructions at home:  Eating and drinking   Follow recommendations from your health care provider about what you eat and drink. Your health care provider may advise you to: ? Limit fast foods, sweets, and processed snack foods. ? Choose low-fat options, such as low-fat milk instead of whole milk. ? Eat 5 or more servings of fruits or vegetables every day. ? Eat at home more often. This gives you more control over what you eat. ? Choose healthy foods when you eat out. ? Learn what a healthy portion size is. ? Keep low-fat snacks on hand. ? Avoid sugary drinks, such as soda, fruit juice, iced tea sweetened with sugar, and flavored milk. ? Eat a healthy breakfast.  Drink enough water to keep your urine clear or pale yellow.  Do not go without eating for long periods of time (do not fast) or follow a fad diet. Fasting and fad diets can be unhealthy and even dangerous. Physical Activity  Exercise regularly, as told by your health care provider. Ask your health care provider what types of exercise are safe for you and how often you should exercise.  Warm up and stretch before being active.  Cool down and stretch after being active.  Rest between periods of activity. Lifestyle  Limit the time that you spend in front of your TV, computer, or video game system.  Find ways to reward yourself that do not involve food.  Limit alcohol intake to no more than 1 drink a day for nonpregnant women and 2 drinks a day for men. One drink equals 12 oz of beer, 5 oz of  wine, or 1 oz of hard liquor. General instructions  Keep a weight loss journal to keep track of the food you eat and how much you exercise you get.  Take over-the-counter and prescription medicines only as told by your health care provider.  Take vitamins and supplements only as told by your health care provider.  Consider joining a support group. Your health care provider may be able to recommend a support group.  Keep all follow-up visits as told by your health care provider. This is important. Contact a health care provider if:  You are unable to meet your weight loss goal after 6 weeks of dietary and lifestyle changes. This information is not intended to replace advice given to you by your health care provider. Make sure you discuss any questions you have with your health care provider. Document Released: 10/09/2004 Document Revised: 02/04/2016 Document Reviewed: 06/20/2015 Elsevier Interactive Patient Education  2019 Elsevier Inc.  Preventing Unhealthy [employer], Adult Staying at a healthy weight is important to your overall health. When fat builds up in your body, you may become overweight or obese. Being overweight or obese increases your risk of developing certain health problems, such as heart disease, diabetes, sleeping problems, joint problems,  and some types of cancer. Unhealthy weight gain is often the result of making unhealthy food choices or not getting enough exercise. You can make changes to your lifestyle to prevent obesity and stay as healthy as possible. What nutrition changes can be made?   Eat only as much as your body needs. To do this: ? Pay attention to signs that you are hungry or full. Stop eating as soon as you feel full. ? If you feel hungry, try drinking water first before eating. Drink enough water so your urine is clear or pale yellow. ? Eat smaller portions. Pay attention to portion sizes when eating out. ? Look at serving sizes on food labels. Most  foods contain more than one serving per container. ? Eat the recommended number of calories for your gender and activity level. For most active people, a daily total of 2,000 calories is appropriate. If you are trying to lose weight or are not very active, you may need to eat fewer calories. Talk with your health care provider or a diet and nutrition specialist (dietitian) about how many calories you need each day.  Choose healthy foods, such as: ? Fruits and vegetables. At each meal, try to fill at least half of your plate with fruits and vegetables. ? Whole grains, such as whole-wheat bread, brown rice, and quinoa. ? Lean meats, such as chicken or fish. ? Other healthy proteins, such as beans, eggs, or tofu. ? Healthy fats, such as nuts, seeds, fatty fish, and olive oil. ? Low-fat or fat-free dairy products.  Check food labels, and avoid food and drinks that: ? Are high in calories. ? Have added sugar. ? Are high in sodium. ? Have saturated fats or trans fats.  Cook foods in healthier ways, such as by baking, broiling, or grilling.  Make a meal plan for the week, and shop with a grocery list to help you stay on track with your purchases. Try to avoid going to the grocery store when you are hungry.  When grocery shopping, try to shop around the outside of the store first, where the fresh foods are. Doing this helps you to avoid prepackaged foods, which can be high in sugar, salt (sodium), and fat. What lifestyle changes can be made?   Exercise for 30 or more minutes on 5 or more days each week. Exercising may include brisk walking, yard work, biking, running, swimming, and team sports like basketball and soccer. Ask your health care provider which exercises are safe for you.  Do muscle-strengthening activities, such as lifting weights or using resistance bands, on 2 or more days a week.  Do not use any products that contain nicotine or tobacco, such as cigarettes and e-cigarettes. If  you need help quitting, ask your health care provider.  Limit alcohol intake to no more than 1 drink a day for nonpregnant women and 2 drinks a day for men. One drink equals 12 oz of beer, 5 oz of wine, or 1 oz of hard liquor.  Try to get 7-9 hours of sleep each night. What other changes can be made?  Keep a food and activity journal to keep track of: ? What you ate and how many calories you had. Remember to count the calories in sauces, dressings, and side dishes. ? Whether you were active, and what exercises you did. ? Your calorie, weight, and activity goals.  Check your weight regularly. Track any changes. If you notice you have gained weight, make changes to your  diet or activity routine.  Avoid taking weight-loss medicines or supplements. Talk to your health care provider before starting any new medicine or supplement.  Talk to your health care provider before trying any new diet or exercise plan. Why are these changes important? Eating healthy, staying active, and having healthy habits can help you to prevent obesity. Those changes also:  Help you manage stress and emotions.  Help you connect with friends and family.  Improve your self-esteem.  Improve your sleep.  Prevent long-term health problems. What can happen if changes are not made? Being obese or overweight can cause you to develop joint or bone problems, which can make it hard for you to stay active or do activities you enjoy. Being obese or overweight also puts stress on your heart and lungs and can lead to health problems like diabetes, heart disease, and some cancers. Where to find more information Talk with your health care provider or a dietitian about healthy eating and healthy lifestyle choices. You may also find information from:  U.S. Department of Agriculture, MyPlate: FormerBoss.no  American Heart Association: www.heart.org  Centers for Disease Control and Prevention:  http://www.wolf.info/ Summary  Staying at a healthy weight is important to your overall health. It helps you to prevent certain diseases and health problems, such as heart disease, diabetes, joint problems, sleep disorders, and some types of cancer.  Being obese or overweight can cause you to develop joint or bone problems, which can make it hard for you to stay active or do activities you enjoy.  You can prevent unhealthy weight gain by eating a healthy diet, exercising regularly, not smoking, limiting alcohol, and getting enough sleep.  Talk with your health care provider or a dietitian for guidance about healthy eating and healthy lifestyle choices. This information is not intended to replace advice given to you by your health care provider. Make sure you discuss any questions you have with your health care provider. Document Released: 09/02/2016 Document Revised: 06/12/2017 Document Reviewed: 10/08/2016 Elsevier Interactive Patient Education  2019 [employer].

## 2018-11-02 NOTE — Assessment & Plan Note (Signed)
Managed by urologist 

## 2018-11-02 NOTE — Assessment & Plan Note (Signed)
Controlled BP today on four agents; limit sodium; weight loss encouraged

## 2018-11-02 NOTE — Assessment & Plan Note (Signed)
Seeing nephrologist; patient tells me that nephrologist told him no aspirin or aspirin products

## 2018-11-02 NOTE — Telephone Encounter (Signed)
Please let patient know that I've ordered a chest CT for lung cancer screening, since he used to be a smoker Just want him to be aware of that expected phone call  Also, we didn't talk about his alcohol use We recommend no more than 14 drinks per week for men; I'll advise him to cut back on alcohol intake and that will also help him lose weight; if he needs help doing that, we've got resources

## 2018-11-02 NOTE — Assessment & Plan Note (Signed)
Encouraged weight loss; check A1c in April

## 2018-11-04 DIAGNOSIS — G4733 Obstructive sleep apnea (adult) (pediatric): Secondary | ICD-10-CM | POA: Diagnosis not present

## 2018-11-05 ENCOUNTER — Telehealth: Payer: Self-pay | Admitting: *Deleted

## 2018-11-05 NOTE — Telephone Encounter (Signed)
Received referral for initial lung cancer screening scan. Contacted patient who adamantly refuses to have lung screening CT scan at this time.

## 2018-11-11 DIAGNOSIS — G4733 Obstructive sleep apnea (adult) (pediatric): Secondary | ICD-10-CM | POA: Diagnosis not present

## 2018-12-03 DIAGNOSIS — G4733 Obstructive sleep apnea (adult) (pediatric): Secondary | ICD-10-CM | POA: Diagnosis not present

## 2018-12-13 DIAGNOSIS — G4733 Obstructive sleep apnea (adult) (pediatric): Secondary | ICD-10-CM | POA: Diagnosis not present

## 2018-12-14 ENCOUNTER — Other Ambulatory Visit: Payer: Self-pay | Admitting: Family Medicine

## 2018-12-15 NOTE — Telephone Encounter (Signed)
Lab Results  Component Value Date   CREATININE 1.31 (H) 07/02/2018   Lab Results  Component Value Date   K 4.0 07/02/2018

## 2018-12-27 ENCOUNTER — Other Ambulatory Visit: Payer: Self-pay | Admitting: Nurse Practitioner

## 2019-01-06 ENCOUNTER — Other Ambulatory Visit: Payer: Self-pay

## 2019-01-06 ENCOUNTER — Ambulatory Visit: Payer: Medicare HMO | Admitting: Family Medicine

## 2019-01-06 ENCOUNTER — Other Ambulatory Visit: Payer: Medicare HMO

## 2019-01-06 DIAGNOSIS — C61 Malignant neoplasm of prostate: Secondary | ICD-10-CM | POA: Diagnosis not present

## 2019-01-07 ENCOUNTER — Other Ambulatory Visit: Payer: Medicare HMO

## 2019-01-07 LAB — PSA: Prostate Specific Ag, Serum: 0.2 ng/mL (ref 0.0–4.0)

## 2019-01-10 ENCOUNTER — Telehealth: Payer: Self-pay

## 2019-01-10 NOTE — Telephone Encounter (Signed)
-----   Message from Billey Co, MD sent at 01/10/2019 11:47 AM EDT ----- PSA is unchanged at 0.2, this is great news! Can stay off lupron and recheck PSA in 6 months.   Please schedule follow up with me in 6 months with PSA prior.  Thanks Nickolas Madrid, MD 01/10/2019

## 2019-01-10 NOTE — Telephone Encounter (Signed)
Called pt lines states that this number is not available at this time. Unable to leave message. Will call again. 1st attempt.

## 2019-01-11 ENCOUNTER — Other Ambulatory Visit: Payer: Self-pay | Admitting: Nurse Practitioner

## 2019-01-12 ENCOUNTER — Ambulatory Visit (INDEPENDENT_AMBULATORY_CARE_PROVIDER_SITE_OTHER): Payer: Medicare HMO | Admitting: Nurse Practitioner

## 2019-01-12 ENCOUNTER — Encounter: Payer: Self-pay | Admitting: *Deleted

## 2019-01-12 ENCOUNTER — Other Ambulatory Visit: Payer: Self-pay

## 2019-01-12 ENCOUNTER — Encounter: Payer: Self-pay | Admitting: Nurse Practitioner

## 2019-01-12 VITALS — BP 162/68 | HR 89 | Ht 67.0 in | Wt 239.0 lb

## 2019-01-12 DIAGNOSIS — M25572 Pain in left ankle and joints of left foot: Secondary | ICD-10-CM | POA: Diagnosis not present

## 2019-01-12 NOTE — Progress Notes (Signed)
Virtual Visit via Video Note  I connected with Todd Hutchinson  on 01/12/19 at  9:20 AM EDT by a video enabled telemedicine application and verified that I am speaking with the correct person using two identifiers.   Staff discussed the limitations of evaluation and management by telemedicine and the availability of in person appointments. The patient expressed understanding and agreed to proceed.  Patients location: home  My location: home office  Other people in meeting: Wife, Katha Cabal   HPI  Patient endorses left foot pain at the top of his foot started about 7 days ago. States some swelling to the top of the foot. States he was outside cleaning the drain out and tripped and fell but caught himself but that's when it started hurting. Has been taking acetaminophen, unkers ointment with minimal relief of symptoms. States over the weekend left knee started hurting. States pain is worse with walking   PHQ2/9: Depression screen [facility] 2/9 01/12/2019 11/02/2018 07/02/2018 06/30/2017 10/15/2016  Decreased Interest 0 0 0 0 0  Down, Depressed, Hopeless 0 0 0 0 0  PHQ - 2 Score 0 0 0 0 0  Altered sleeping 0 0 - - -  Tired, decreased energy 0 0 - - -  Change in appetite 0 0 - - -  Feeling bad or failure about yourself  0 0 - - -  Trouble concentrating 0 0 - - -  Moving slowly or fidgety/restless 0 0 - - -  Suicidal thoughts 0 0 - - -  PHQ-9 Score 0 0 - - -  Difficult doing work/chores Not difficult at all Not difficult at all - - -    PHQ reviewed. Negative  Patient Active Problem List   Diagnosis Date Noted  . Morbid obesity ([place]) 11/02/2018  . History of colonic polyps   . Benign neoplasm of ascending colon   . Benign neoplasm of cecum   . Hypertensive nephrosclerosis 01/27/2018  . Prediabetes 06/30/2017  . Screening for AAA (abdominal aortic aneurysm) 06/30/2017  . Welcome to Medicare preventive visit 06/30/2017  . COPD (chronic obstructive pulmonary disease) ([place]) 04/15/2017   . Hx of tobacco use, presenting hazards to health 04/17/2016  . Abnormal weight gain 03/13/2016  . Hypercholesterolemia 03/13/2016  . Medication monitoring encounter 03/13/2016  . Chronic kidney disease, stage III (moderate) ([place]) 07/10/2015  . Annual physical exam 04/10/2015  . Genuine stress incontinence, male 06/23/2012  . Cystitis, radiation 05/31/2012  . Hematuria, microscopic 05/31/2012  . ED (erectile dysfunction) of organic origin 05/31/2012  . Hx of malignant neoplasm of prostate 05/31/2012  . Microscopic hematuria 05/31/2012    Past Medical History:  Diagnosis Date  . Abnormal weight gain 03/13/2016  . COPD (chronic obstructive pulmonary disease) ([place])   . Hx of malignant neoplasm of prostate 05/31/2012  . Hx of tobacco use, presenting hazards to health 04/17/2016  . Hypercholesterolemia 03/13/2016  . Hyperlipidemia   . Hypertension   . Hypertensive nephrosclerosis 01/27/2018  . Prostate cancer ([place])   . Sleep apnea    uses CPAP    Past Surgical History:  Procedure Laterality Date  . COLONOSCOPY WITH PROPOFOL N/A 07/29/2018   Procedure: COLONOSCOPY WITH BIOPSIES;  Surgeon: Lucilla Lame, MD;  Location: [place];  Service: Endoscopy;  Laterality: N/A;  . POLYPECTOMY N/A 07/29/2018   Procedure: POLYPECTOMY;  Surgeon: Lucilla Lame, MD;  Location: [place];  Service: Endoscopy;  Laterality: N/A;  . PROSTATE SURGERY      Social History  Tobacco Use  . Smoking status: Former Smoker    Packs/day: 1.00    Years: 40.00    Pack years: 40.00    Last attempt to quit: 11/2013    Years since quitting: 5.1  . Smokeless tobacco: Never Used  Substance Use Topics  . Alcohol use: Yes    Alcohol/week: 16.0 standard drinks    Types: 16 Shots of liquor per week    Comment: no more than 4 whiskey drinks a day; maybe 4 times day      Current Outpatient Medications:  .  amLODipine-benazepril (LOTREL) 10-40 MG capsule, TAKE 1 CAPSULE DAILY, Disp: 90 capsule,  Rfl: 0 .  carvedilol (COREG) 6.25 MG tablet, TAKE 1 TABLET TWICE A DAY, Disp: 180 tablet, Rfl: 1 .  chlorthalidone (HYGROTON) 25 MG tablet, TAKE 1 TABLET DAILY, Disp: 90 tablet, Rfl: 0 .  Cholecalciferol (VITAMIN D-3) 1000 units CAPS, Take 1 capsule (1,000 Units total) by mouth daily., Disp: , Rfl:  .  Omega-3 Fatty Acids (FISH OIL) 1000 MG CAPS, Take 1,000 mg by mouth daily., Disp: , Rfl:  .  pravastatin (PRAVACHOL) 40 MG tablet, TAKE 1 TABLET AT BEDTIME, Disp: 90 tablet, Rfl: 1  Allergies  Allergen Reactions  . Atorvastatin Rash    ROS  No other specific complaints in a complete review of systems (except as listed in HPI above).  Objective  Vitals:   01/12/19 0900  BP: (!) 162/68  Pulse: 89  Weight: 239 lb (108.4 kg)  Height: 5\' 7"  (1.702 m)   Body mass index is 37.43 kg/m.  Nursing Note and Vital Signs reviewed.  Physical Exam Musculoskeletal:     Left ankle: He exhibits decreased range of motion and swelling. He exhibits no ecchymosis and no deformity. No tenderness. Achilles tendon exhibits no pain.       Feet:     Constitutional: Patient appears well-developed and well-nourished. No distress.  HENT: Head: Normocephalic and atraumatic. Cardiovascular: Normal rate Pulmonary/Chest: Effort normal  Musculoskeletal: see diagram Neurological: he is alert and oriented to person, place, and time. speech is normal.  Skin: No rash noted. No erythema.  Psychiatric: Patient has a normal mood and affect. behavior is normal. Judgment and thought content normal.    Assessment & Plan  1. Acute left ankle pain RICE, acetaminophen PRN, ankle brace - DG Ankle Complete Left; Future - DG Foot Complete Left; Future   Follow Up Instructions:   4-6 weeks   I discussed the assessment and treatment plan with the patient. The patient was provided an opportunity to ask questions and all were answered. The patient agreed with the plan and demonstrated an understanding of the  instructions.   The patient was advised to call back or seek an in-person evaluation if the symptoms worsen or if the condition fails to improve as anticipated.  I provided 16 minutes of non-face-to-face time during this encounter.   Fredderick Severance, NP

## 2019-01-12 NOTE — Telephone Encounter (Signed)
Patient aware of results-appointment scheduled.

## 2019-01-12 NOTE — Telephone Encounter (Signed)
Left VM to return call 

## 2019-01-13 DIAGNOSIS — G4733 Obstructive sleep apnea (adult) (pediatric): Secondary | ICD-10-CM | POA: Diagnosis not present

## 2019-01-20 DIAGNOSIS — D631 Anemia in chronic kidney disease: Secondary | ICD-10-CM | POA: Diagnosis not present

## 2019-01-20 DIAGNOSIS — N183 Chronic kidney disease, stage 3 (moderate): Secondary | ICD-10-CM | POA: Diagnosis not present

## 2019-01-20 DIAGNOSIS — I129 Hypertensive chronic kidney disease with stage 1 through stage 4 chronic kidney disease, or unspecified chronic kidney disease: Secondary | ICD-10-CM | POA: Diagnosis not present

## 2019-01-20 DIAGNOSIS — I10 Essential (primary) hypertension: Secondary | ICD-10-CM | POA: Diagnosis not present

## 2019-01-20 DIAGNOSIS — E785 Hyperlipidemia, unspecified: Secondary | ICD-10-CM | POA: Diagnosis not present

## 2019-01-20 DIAGNOSIS — E559 Vitamin D deficiency, unspecified: Secondary | ICD-10-CM | POA: Diagnosis not present

## 2019-01-23 ENCOUNTER — Other Ambulatory Visit: Payer: Self-pay | Admitting: Family Medicine

## 2019-01-23 DIAGNOSIS — I10 Essential (primary) hypertension: Secondary | ICD-10-CM

## 2019-01-25 ENCOUNTER — Other Ambulatory Visit: Payer: Medicare HMO

## 2019-01-26 DIAGNOSIS — I129 Hypertensive chronic kidney disease with stage 1 through stage 4 chronic kidney disease, or unspecified chronic kidney disease: Secondary | ICD-10-CM | POA: Diagnosis not present

## 2019-01-26 DIAGNOSIS — R319 Hematuria, unspecified: Secondary | ICD-10-CM | POA: Diagnosis not present

## 2019-01-26 DIAGNOSIS — N183 Chronic kidney disease, stage 3 (moderate): Secondary | ICD-10-CM | POA: Diagnosis not present

## 2019-02-03 ENCOUNTER — Other Ambulatory Visit: Payer: Self-pay

## 2019-02-03 DIAGNOSIS — R7303 Prediabetes: Secondary | ICD-10-CM

## 2019-02-03 DIAGNOSIS — E7800 Pure hypercholesterolemia, unspecified: Secondary | ICD-10-CM | POA: Diagnosis not present

## 2019-02-03 DIAGNOSIS — I129 Hypertensive chronic kidney disease with stage 1 through stage 4 chronic kidney disease, or unspecified chronic kidney disease: Secondary | ICD-10-CM

## 2019-02-04 LAB — HEMOGLOBIN A1C
Hgb A1c MFr Bld: 5.8 % of total Hgb — ABNORMAL HIGH (ref <5.7)
Mean Plasma Glucose: 120 (calc)
eAG (mmol/L): 6.6 (calc)

## 2019-02-04 LAB — COMPLETE METABOLIC PANEL WITH GFR
AG Ratio: 1.5 (calc) (ref 1.0–2.5)
ALT: 17 U/L (ref 9–46)
AST: 11 U/L (ref 10–35)
Albumin: 4.3 g/dL (ref 3.6–5.1)
Alkaline phosphatase (APISO): 33 U/L — ABNORMAL LOW (ref 35–144)
BUN: 22 mg/dL (ref 7–25)
CO2: 26 mmol/L (ref 20–32)
Calcium: 9.9 mg/dL (ref 8.6–10.3)
Chloride: 104 mmol/L (ref 98–110)
Creat: 1.22 mg/dL (ref 0.70–1.25)
GFR, Est African American: 71 mL/min/{1.73_m2} (ref >=60)
GFR, Est Non African American: 61 mL/min/{1.73_m2} (ref >=60)
Globulin: 2.9 g/dL (calc) (ref 1.9–3.7)
Glucose, Bld: 120 mg/dL — ABNORMAL HIGH (ref 65–99)
Potassium: 4.4 mmol/L (ref 3.5–5.3)
Sodium: 140 mmol/L (ref 135–146)
Total Bilirubin: 0.4 mg/dL (ref 0.2–1.2)
Total Protein: 7.2 g/dL (ref 6.1–8.1)

## 2019-02-04 LAB — LIPID PANEL
Cholesterol: 165 mg/dL (ref <200)
HDL: 52 mg/dL (ref >=40)
LDL Cholesterol (Calc): 90 mg/dL (calc)
Non-HDL Cholesterol (Calc): 113 mg/dL (calc) (ref <130)
Total CHOL/HDL Ratio: 3.2 (calc) (ref <5.0)
Triglycerides: 132 mg/dL (ref <150)

## 2019-02-08 DIAGNOSIS — N529 Male erectile dysfunction, unspecified: Secondary | ICD-10-CM | POA: Diagnosis not present

## 2019-02-08 DIAGNOSIS — Z87891 Personal history of nicotine dependence: Secondary | ICD-10-CM | POA: Diagnosis not present

## 2019-02-08 DIAGNOSIS — I129 Hypertensive chronic kidney disease with stage 1 through stage 4 chronic kidney disease, or unspecified chronic kidney disease: Secondary | ICD-10-CM | POA: Diagnosis not present

## 2019-02-08 DIAGNOSIS — Z823 Family history of stroke: Secondary | ICD-10-CM | POA: Diagnosis not present

## 2019-02-08 DIAGNOSIS — E785 Hyperlipidemia, unspecified: Secondary | ICD-10-CM | POA: Diagnosis not present

## 2019-02-08 DIAGNOSIS — Z8249 Family history of ischemic heart disease and other diseases of the circulatory system: Secondary | ICD-10-CM | POA: Diagnosis not present

## 2019-02-08 DIAGNOSIS — Z8546 Personal history of malignant neoplasm of prostate: Secondary | ICD-10-CM | POA: Diagnosis not present

## 2019-02-08 DIAGNOSIS — N189 Chronic kidney disease, unspecified: Secondary | ICD-10-CM | POA: Diagnosis not present

## 2019-02-09 ENCOUNTER — Ambulatory Visit (INDEPENDENT_AMBULATORY_CARE_PROVIDER_SITE_OTHER): Payer: Medicare HMO | Admitting: Nurse Practitioner

## 2019-02-09 ENCOUNTER — Encounter: Payer: Self-pay | Admitting: Nurse Practitioner

## 2019-02-09 VITALS — BP 146/65 | HR 77 | Ht 67.0 in | Wt 240.0 lb

## 2019-02-09 DIAGNOSIS — I129 Hypertensive chronic kidney disease with stage 1 through stage 4 chronic kidney disease, or unspecified chronic kidney disease: Secondary | ICD-10-CM | POA: Diagnosis not present

## 2019-02-09 DIAGNOSIS — E7800 Pure hypercholesterolemia, unspecified: Secondary | ICD-10-CM | POA: Diagnosis not present

## 2019-02-09 DIAGNOSIS — N183 Chronic kidney disease, stage 3 unspecified: Secondary | ICD-10-CM

## 2019-02-09 DIAGNOSIS — R7303 Prediabetes: Secondary | ICD-10-CM

## 2019-02-09 NOTE — Progress Notes (Signed)
Virtual Visit via Video Note  I connected with Todd Hutchinson on 02/09/19 at  8:00 AM EDT by a video enabled telemedicine application and verified that I am speaking with the correct person using two identifiers.   Staff discussed the limitations of evaluation and management by telemedicine and the availability of in person appointments. The patient expressed understanding and agreed to proceed.  Patient location: home  My location: work office Other people present: wife, connelle Roebuck HPI  Patient presents for follow-up on left ankle pain states used ankle brace and PRN acetaminophen states pain and swelling resolved about 1 week in and is back to baseline. States as soon as he put the ace bandage and elevated it pain was much improved.   Hypertension  Amlodipine-benazepril10-40mg  daily and coreg 6.25mg  BID and chlorthalidone 25mg  daily. States missed one does 2 days ago but typically does not miss any doses. Denies headache, blurry vision, chest pain.  Typically 130-140/60's BP Readings from Last 3 Encounters:  02/09/19 (!) 146/65  01/12/19 (!) 162/68  11/02/18 128/70   Hyperlipidemia Patient rx pravastatin 40mg  and fish oils. Takes medications as prescribed with no missed doses a month.  Diet: 4-5 vegetable a week. Eats fried foods maybe twice a week.  Denies myalgias  Lab Results  Component Value Date   CHOL 165 02/03/2019   HDL 52 02/03/2019   LDLCALC 90 02/03/2019   TRIG 132 02/03/2019   CHOLHDL 3.2 02/03/2019   Vitamin D Takes 1000IU vitamin D daily.   Prediabetes Breakfast: eggs Lunch: sandwich Dinner: salmon, and a lot of fish, peas, green beans, mashed potatoes.   Chronic Kidney Disease Sees Dr. Juleen China every 6 months but now has gone to annually.  PHQ2/9: Depression screen [facility] 2/9 02/09/2019 01/12/2019 11/02/2018 07/02/2018 06/30/2017  Decreased Interest 0 0 0 0 0  Down, Depressed, Hopeless 0 0 0 0 0  PHQ - 2 Score 0 0 0 0 0  Altered sleeping 0 0 0 - -   Tired, decreased energy 0 0 0 - -  Change in appetite 0 0 0 - -  Feeling bad or failure about yourself  0 0 0 - -  Trouble concentrating 0 0 0 - -  Moving slowly or fidgety/restless 0 0 0 - -  Suicidal thoughts 0 0 0 - -  PHQ-9 Score 0 0 0 - -  Difficult doing work/chores Not difficult at all Not difficult at all Not difficult at all - -     PHQ reviewed. Negative  Patient Active Problem List   Diagnosis Date Noted  . Morbid obesity ([place]) 11/02/2018  . History of colonic polyps   . Benign neoplasm of ascending colon   . Benign neoplasm of cecum   . Hypertensive nephrosclerosis 01/27/2018  . Prediabetes 06/30/2017  . Screening for AAA (abdominal aortic aneurysm) 06/30/2017  . Welcome to Medicare preventive visit 06/30/2017  . COPD (chronic obstructive pulmonary disease) ([place]) 04/15/2017  . Hx of tobacco use, presenting hazards to health 04/17/2016  . Abnormal weight gain 03/13/2016  . Hypercholesterolemia 03/13/2016  . Medication monitoring encounter 03/13/2016  . Chronic kidney disease, stage III (moderate) ([place]) 07/10/2015  . Annual physical exam 04/10/2015  . Genuine stress incontinence, male 06/23/2012  . Cystitis, radiation 05/31/2012  . Hematuria, microscopic 05/31/2012  . ED (erectile dysfunction) of organic origin 05/31/2012  . Hx of malignant neoplasm of prostate 05/31/2012  . Microscopic hematuria 05/31/2012    Past Medical History:  Diagnosis Date  . Abnormal weight gain  03/13/2016  . COPD (chronic obstructive pulmonary disease) ([place])   . Hx of malignant neoplasm of prostate 05/31/2012  . Hx of tobacco use, presenting hazards to health 04/17/2016  . Hypercholesterolemia 03/13/2016  . Hyperlipidemia   . Hypertension   . Hypertensive nephrosclerosis 01/27/2018  . Prostate cancer ([place])   . Sleep apnea    uses CPAP    Past Surgical History:  Procedure Laterality Date  . COLONOSCOPY WITH PROPOFOL N/A 07/29/2018   Procedure: COLONOSCOPY WITH BIOPSIES;  Surgeon:  Lucilla Lame, MD;  Location: [place];  Service: Endoscopy;  Laterality: N/A;  . POLYPECTOMY N/A 07/29/2018   Procedure: POLYPECTOMY;  Surgeon: Lucilla Lame, MD;  Location: [place];  Service: Endoscopy;  Laterality: N/A;  . PROSTATE SURGERY      Social History   Tobacco Use  . Smoking status: Former Smoker    Packs/day: 1.00    Years: 40.00    Pack years: 40.00    Last attempt to quit: 11/2013    Years since quitting: 5.2  . Smokeless tobacco: Never Used  Substance Use Topics  . Alcohol use: Yes    Alcohol/week: 16.0 standard drinks    Types: 16 Shots of liquor per week    Comment: no more than 4 whiskey drinks a day; maybe 4 times day      Current Outpatient Medications:  .  amLODipine-benazepril (LOTREL) 10-40 MG capsule, TAKE 1 CAPSULE DAILY, Disp: 90 capsule, Rfl: 0 .  carvedilol (COREG) 6.25 MG tablet, TAKE 1 TABLET TWICE A DAY, Disp: 180 tablet, Rfl: 1 .  chlorthalidone (HYGROTON) 25 MG tablet, TAKE 1 TABLET DAILY, Disp: 90 tablet, Rfl: 0 .  Cholecalciferol (VITAMIN D-3) 1000 units CAPS, Take 1 capsule (1,000 Units total) by mouth daily., Disp: , Rfl:  .  Omega-3 Fatty Acids (FISH OIL) 1000 MG CAPS, Take 1,000 mg by mouth daily., Disp: , Rfl:  .  pravastatin (PRAVACHOL) 40 MG tablet, TAKE 1 TABLET AT BEDTIME, Disp: 90 tablet, Rfl: 1  Allergies  Allergen Reactions  . Atorvastatin Rash    Review of Systems  Constitutional: Negative for chills, fever and malaise/fatigue.  Respiratory: Negative for cough and shortness of breath.   Cardiovascular: Negative for chest pain, palpitations and leg swelling.  Gastrointestinal: Negative for abdominal pain.  Musculoskeletal: Negative for joint pain and myalgias.  Skin: Negative for rash.  Neurological: Negative for dizziness and headaches.  Psychiatric/Behavioral: The patient is not nervous/anxious and does not have insomnia.      No other specific complaints in a complete review of systems (except as  listed in HPI above).  Objective  Vitals:   02/09/19 0802  BP: (!) 146/65  Pulse: 77  Weight: 240 lb (108.9 kg)  Height: 5\' 7"  (1.702 m)     Body mass index is 37.59 kg/m.  Nursing Note and Vital Signs reviewed.  Physical Exam   Constitutional: Patient appears well-developed and well-nourished. No distress.  HENT: Head: Normocephalic and atraumatic. Cardiovascular: Normal rate Pulmonary/Chest: Effort normal  Musculoskeletal: Normal range of motion,  Neurological: alert and oriented, speech normal.  Skin: No rash noted. No erythema.  Psychiatric: Patient has a normal mood and affect. behavior is normal. Judgment and thought content normal.    Assessment & Plan  1. Hypertensive nephrosclerosis, stage 1 through stage 4 or unspecified chronic kidney disease Stable kidney function and blood pressure. Routine follow-up with nephrologist.   2. Chronic kidney disease, stage III (moderate) (HCC) Avoid NSAIDs, follow up with nephrology  3. Hypercholesterolemia Stable continue meds  4. Prediabetes Improving   5. Morbid obesity ([place]) Discussed diet and activity    Follow Up Instructions:  4 months   I discussed the assessment and treatment plan with the patient. The patient was provided an opportunity to ask questions and all were answered. The patient agreed with the plan and demonstrated an understanding of the instructions.   The patient was advised to call back or seek an in-person evaluation if the symptoms worsen or if the condition fails to improve as anticipated.  I provided 22 minutes of non-face-to-face time during this encounter.   Fredderick Severance, NP

## 2019-02-13 DIAGNOSIS — G4733 Obstructive sleep apnea (adult) (pediatric): Secondary | ICD-10-CM | POA: Diagnosis not present

## 2019-03-09 ENCOUNTER — Other Ambulatory Visit: Payer: Self-pay | Admitting: Family Medicine

## 2019-03-15 DIAGNOSIS — G4733 Obstructive sleep apnea (adult) (pediatric): Secondary | ICD-10-CM | POA: Diagnosis not present

## 2019-03-30 DIAGNOSIS — R69 Illness, unspecified: Secondary | ICD-10-CM | POA: Diagnosis not present

## 2019-04-04 DIAGNOSIS — R69 Illness, unspecified: Secondary | ICD-10-CM | POA: Diagnosis not present

## 2019-04-13 ENCOUNTER — Other Ambulatory Visit: Payer: Self-pay | Admitting: Family Medicine

## 2019-04-13 DIAGNOSIS — I10 Essential (primary) hypertension: Secondary | ICD-10-CM

## 2019-04-21 ENCOUNTER — Encounter: Payer: Self-pay | Admitting: Emergency Medicine

## 2019-04-21 ENCOUNTER — Ambulatory Visit
Admission: EM | Admit: 2019-04-21 | Discharge: 2019-04-21 | Disposition: A | Discharge status: Home / Self Care | Payer: Medicare HMO | Attending: Urgent Care | Admitting: Urgent Care

## 2019-04-21 ENCOUNTER — Other Ambulatory Visit: Payer: Self-pay

## 2019-04-21 DIAGNOSIS — H60503 Unspecified acute noninfective otitis externa, bilateral: Secondary | ICD-10-CM | POA: Insufficient documentation

## 2019-04-21 DIAGNOSIS — R42 Dizziness and giddiness: Secondary | ICD-10-CM | POA: Diagnosis not present

## 2019-04-21 DIAGNOSIS — H6123 Impacted cerumen, bilateral: Secondary | ICD-10-CM | POA: Diagnosis not present

## 2019-04-21 MED ORDER — NEOMYCIN-POLYMYXIN-HC 3.5-10000-1 OT SUSP: 4.0000 [drp] | Freq: Three times a day (TID) | OTIC | 0 refills | Status: DC

## 2019-04-21 NOTE — Discharge Instructions (Signed)
It was very nice seeing you today in clinic. Thank you for entrusting me with your care.  ° °Please utilize the medications that we discussed. Your prescriptions have been called in to your pharmacy.  ° °Make arrangements to follow up with your regular doctor in 1 week for re-evaluation if not improving. If your symptoms/condition worsens, please seek follow up care either here or in the ER. Please remember, our [facility] providers are "right here with you" when you need us.  ° °Again, it was my pleasure to take care of you today. Thank you for choosing our clinic. I hope that you start to feel better quickly.  ° °Judithe Keetch, MSN, APRN, FNP-C, CEN °Advanced Practice Provider °[facility] ° °

## 2019-04-21 NOTE — ED Provider Notes (Signed)
[place], [place]   Name: Todd Hutchinson DOB: 12-12-1950 MRN: [id number] CSN: [id number] PCP: Arnetha Courser, MD  Arrival date and time:  04/21/19 1100  Chief Complaint:  Dizziness   NOTE: Prior to seeing the patient today, I have reviewed the triage nursing documentation and vital signs. Clinical staff has updated patient's PMH/PSHx, current medication list, and drug allergies/intolerances to ensure comprehensive history available to assist in medical decision making.   History:   HPI: Todd Hutchinson is a 68 y.o. male who presents today with complaints of vertiginous symptoms that began with acute onset yesterday. He describes it as being "woozy in the head"; room is not spinning. He reports that he lost his balance and "almost fell" this morning. Symptoms have been self limiting and resolve "after he gets going". Patient denies recent illness; no cough, congestion, sore throat, or forceful nose blowing. He has not been in close contact with any one known to be sick. Patient denies nausea, vomiting, and changes to his bowel habits. Patient is eating and drinking well. Patient is on daily oral diuretic therapy for his blood pressure.   Patient presents with low grade temperature of 100. Patient presents today feeling generally well. He has no other concerning symptoms.   Past Medical History:  Diagnosis Date  . Abnormal weight gain 03/13/2016  . COPD (chronic obstructive pulmonary disease) ([place])   . Hx of malignant neoplasm of prostate 05/31/2012  . Hx of tobacco use, presenting hazards to health 04/17/2016  . Hypercholesterolemia 03/13/2016  . Hyperlipidemia   . Hypertension   . Hypertensive nephrosclerosis 01/27/2018  . Prostate cancer ([place])   . Sleep apnea    uses CPAP    Past Surgical History:  Procedure Laterality Date  . COLONOSCOPY WITH PROPOFOL N/A 07/29/2018   Procedure: COLONOSCOPY WITH BIOPSIES;  Surgeon: Lucilla Lame, MD;  Location: [facility];   Service: Endoscopy;  Laterality: N/A;  . POLYPECTOMY N/A 07/29/2018   Procedure: POLYPECTOMY;  Surgeon: Lucilla Lame, MD;  Location: [facility];  Service: Endoscopy;  Laterality: N/A;  . PROSTATE SURGERY      Family History  Problem Relation Age of Onset  . Hypertension Mother   . Aneurysm Mother 51       brain  . Hypertension Father   . Heart attack Father   . Hypertension Sister   . Hypertension Brother   . Heart failure Brother   . Heart disease Paternal Grandfather   . Hypertension Sister   . Hypertension Brother   . Cancer Brother        throat cancer  . Kidney cancer Neg Hx   . Kidney failure Neg Hx   . Prostate cancer Neg Hx   . Sickle cell anemia Neg Hx   . Tuberculosis Neg Hx     Social History   Tobacco Use  . Smoking status: Former Smoker    Packs/day: 1.00    Years: 40.00    Pack years: 40.00    Quit date: 11/2013    Years since quitting: 5.4  . Smokeless tobacco: Never Used  Substance Use Topics  . Alcohol use: Yes    Alcohol/week: 16.0 standard drinks    Types: 16 Shots of liquor per week    Comment: no more than 4 whiskey drinks a day; maybe 4 times day   . Drug use: No    Patient Active Problem List   Diagnosis Date Noted  . Morbid obesity ([place]) 11/02/2018  . History  of colonic polyps   . Benign neoplasm of ascending colon   . Benign neoplasm of cecum   . Hypertensive nephrosclerosis 01/27/2018  . Prediabetes 06/30/2017  . Screening for AAA (abdominal aortic aneurysm) 06/30/2017  . Welcome to Medicare preventive visit 06/30/2017  . COPD (chronic obstructive pulmonary disease) ([place]) 04/15/2017  . Hx of tobacco use, presenting hazards to health 04/17/2016  . Abnormal weight gain 03/13/2016  . Hypercholesterolemia 03/13/2016  . Medication monitoring encounter 03/13/2016  . Chronic kidney disease, stage III (moderate) ([place]) 07/10/2015  . Annual physical exam 04/10/2015  . Genuine stress incontinence, male 06/23/2012  . Cystitis,  radiation 05/31/2012  . Hematuria, microscopic 05/31/2012  . ED (erectile dysfunction) of organic origin 05/31/2012  . Hx of malignant neoplasm of prostate 05/31/2012  . Microscopic hematuria 05/31/2012    Home Medications:    Current Meds  Medication Sig  . amLODipine-benazepril (LOTREL) 10-40 MG capsule TAKE 1 CAPSULE DAILY  . carvedilol (COREG) 6.25 MG tablet TAKE 1 TABLET TWICE A DAY  . chlorthalidone (HYGROTON) 25 MG tablet TAKE 1 TABLET DAILY  . Cholecalciferol (VITAMIN D-3) 1000 units CAPS Take 1 capsule (1,000 Units total) by mouth daily.  . Omega-3 Fatty Acids (FISH OIL) 1000 MG CAPS Take 1,000 mg by mouth daily.  . pravastatin (PRAVACHOL) 40 MG tablet TAKE 1 TABLET AT BEDTIME    Allergies:   Atorvastatin  Review of Systems (ROS): Review of Systems  Constitutional: Negative for chills and fever.  HENT: Negative for congestion, ear discharge, ear pain, hearing loss, sinus pressure, sinus pain, sneezing and tinnitus.   Respiratory: Negative for cough, chest tightness and shortness of breath.   Cardiovascular: Negative for chest pain and palpitations.  Skin: Negative for color change, pallor and rash.  Neurological: Positive for dizziness. Negative for syncope, weakness, numbness and headaches.  Hematological: Negative for adenopathy.  All other systems reviewed and are negative.    Vital Signs: Today's Vitals   04/21/19 1113 04/21/19 1114 04/21/19 1211  BP:  (!) 155/70   Pulse:  79   Resp:  18   Temp:  100 F (37.8 C)   TempSrc:  Oral   SpO2:  96%   Weight: 240 lb (108.9 kg)    Height: 5\' 7"  (1.702 m)    PainSc: 0-No pain  0-No pain    Physical Exam: Physical Exam  Constitutional: He is oriented to person, place, and time and well-developed, well-nourished, and in no distress. No distress.  HENT:  Head: Normocephalic and atraumatic.  Right Ear: No drainage or tenderness.  Left Ear: No drainage or tenderness.  Nose: Nose normal.  Mouth/Throat: Oropharynx  is clear and moist and mucous membranes are normal.  BILATERAL ears with EACs that are totally occluded by cerumen. Unable to visualized TM.   Eyes: Pupils are equal, round, and reactive to light. Conjunctivae and EOM are normal.  Neck: Normal range of motion. Neck supple. No tracheal deviation present.  Cardiovascular: Normal rate, regular rhythm, normal heart sounds and intact distal pulses. Exam reveals no gallop and no friction rub.  No murmur heard. Pulmonary/Chest: Effort normal and breath sounds normal. No respiratory distress. He has no wheezes. He has no rales.  Abdominal: Soft. Bowel sounds are normal. He exhibits no distension. There is no abdominal tenderness.  Musculoskeletal: Normal range of motion.  Lymphadenopathy:    He has no cervical adenopathy.  Neurological: He is alert and oriented to person, place, and time. He has normal sensation, normal strength, normal reflexes  and intact cranial nerves. Gait normal. GCS score is 15.  Skin: Skin is warm and dry. No rash noted. He is not diaphoretic.  Psychiatric: Mood, memory, affect and judgment normal.  Nursing note and vitals reviewed.   Urgent Care Treatments / Results:   LABS: PLEASE NOTE: all labs that were ordered this encounter are listed, however only abnormal results are displayed. Labs Reviewed - No data to display  URGENT CARE ECG REPORT Date: 04/21/2019 Time ECG obtained: 11:23 am Rate: 80 bpm Rhythm: SR with PACs Axis (leads I and aVF): normal Intervals: normal ST segment and T wave changes: No evidence of ST segment elevation or depression Comparison: Similar to previous tracing obtained on 06/30/2017  RADIOLOGY: No results found.  PROCEDURES: Ear Cerumen Removal  Date/Time: 04/21/2019 12:05 PM Performed by: Karen Kitchens, NP Authorized by: Karen Kitchens, NP   Consent:    Consent obtained:  Verbal   Consent given by:  Patient   Risks discussed:  Bleeding, infection, pain, dizziness, incomplete  removal and TM perforation   Alternatives discussed:  Alternative treatment and referral Procedure details:    Location: BILATERAL.   Procedure type comment:  Irrigation with warm water followed by disimpaction with currette Post-procedure details:    Inspection:  Bleeding, macerated skin and TM intact   Hearing quality:  Improved   Patient tolerance of procedure:  Tolerated well, no immediate complications    MEDICATIONS RECEIVED THIS VISIT: Medications - No data to display  PERTINENT CLINICAL COURSE NOTES/UPDATES:   Initial Impression / Assessment and Plan / Urgent Care Course:  Pertinent labs & imaging results that were available during my care of the patient were personally reviewed by me and considered in my medical decision making (see lab/imaging section of note for values and interpretations).  Todd Hutchinson is a 68 y.o. male who presents to [facility] today with complaints of Dizziness   Patient is well appearing overall in clinic today. He does not appear to be in any acute distress. Presenting symptoms (see HPI) and exam as documented above. Clinic staff performed EKG that was reviewed as changed from previous. BILATERAL cerumen impaction; disimpacted as per above. Post-procedural inspection reveals macerated skin within the EACs; bleeding and marked erythema observed. TMs normal. Discussed that dizziness is likely related to the impaction. Advised patient to expect some minor pain and possibly some drainage over the next 1-2 days. Given appearance of ears, will cover with a 5 day course of Cortisporin. Discussed routine ear hygiene to prevent future impactions. May use Tylenol and/or Ibuprofen as needed for pain/fever.   Discussed follow up with primary care physician in 1 week for re-evaluation. I have reviewed the follow up and strict return precautions for any new or worsening symptoms. Patient is aware of symptoms that would be deemed urgent/emergent, and  would thus require further evaluation either here or in the emergency department. At the time of discharge, he verbalized understanding and consent with the discharge plan as it was reviewed with him. All questions were fielded by provider and/or clinic staff prior to patient discharge.    Final Clinical Impressions / Urgent Care Diagnoses:   Final diagnoses:  Bilateral impacted cerumen  Dizziness  Acute otitis externa of both ears, unspecified type    New Prescriptions:  [place] Controlled Substance Registry consulted? Not Applicable  Meds ordered this encounter  Medications  . neomycin-polymyxin-hydrocortisone (CORTISPORIN) 3.5-10000-1 OTIC suspension    Sig: Place 4 drops into both ears 3 (three)  times daily. X 5 days    Dispense:  10 mL    Refill:  0    Recommended Follow up Care:  Patient encouraged to follow up with the following provider within the specified time frame, or sooner as dictated by the severity of his symptoms. As always, he was instructed that for any urgent/emergent care needs, he should seek care either here or in the emergency department for more immediate evaluation.  Follow-up Information    L[place], Satira Anis, MD In 1 week.   Specialty: Family Medicine Why: General reassessment of symptoms if not improving Contact information: [address] [place] [id number] [phone]         NOTE: This note was prepared using Dragon dictation software along with smaller phrase technology. Despite my best ability to proofread, there is the potential that transcriptional errors may still occur from this process, and are completely unintentional.     Karen Kitchens, NP 04/21/19 1221

## 2019-04-21 NOTE — ED Triage Notes (Addendum)
Patient c/o dizziness the last 2 mornings when he has gotten up. He states it resolves but returned the following morning. He does report that he fell this morning from the dizziness.

## 2019-04-21 NOTE — ED Notes (Addendum)
Wax is removed in bilateral ears by syringing. Patient tolerated well.

## 2019-04-25 NOTE — Progress Notes (Addendum)
* [facility]   Virtual Visit via Video Note I connected with patient on 04/26/19 at 10:00 AM EDT by video and verified that I am speaking with the correct person using two identifiers.   I discussed the limitations, risks of performing an evaluation and management service by video and the availability of in person appointments. I also discussed with the patient that there may be a patient responsible charge related to this service.  In light of current covid-19 pandemic, patient also understands that we are trying to protect them by minimizing in office contact if at all possible.  The patient expressed understanding and agreed to proceed. Please see note below for further detail.    The patient was advised to call back or seek an in-person evaluation if the symptoms worsen or if the condition fails to improve as anticipated. I spent 15 minutes of face-to-face time during this visit.   Laverle Hobby, MD   Assessment and Plan:  Severe obstructive sleep apnea.  - Doing well with CPAP, to continue.   Date: 04/25/2019  MRN# [id number] Todd Hutchinson 1950/10/10   Todd Hutchinson is a 68 y.o. old male seen in follow up for chief complaint of sleep apnea.     HPI:  He is using the cpap all night, every night and feeling more awake during the day.   **Review of download data 03/26/2019-04/24/2019>> raw data personally reviewed, usage greater than 4 hours 30/3 days.  Average usage on days used 7 hours 46 minutes.  Pressure ranges 5-20.  Median pressure 10, 95th percentile pressure 13, maximum pressure 15.  Leaks are within normal limits.  Residual AHI 0.8.  Overall this shows excellent compliance with CPAP with excellent control of obstructive sleep apnea. **Review of download data 03/23/2018- 04/21/2018>> download data personally reviewed, average usage on days used is 7 hours 35 minutes.  Usage greater than 4 hours is 30/30 days.  Pressure is auto 5-20.   Median pressure is 10, 95th percentile pressure 13, maximum pressure 15.  Residual AHI is 1.5.  Overall this shows excellent compliance with CPAP with excellent control of obstructive sleep apnea. **Home sleep test 02/10/2018>> severe OSA with AHI of 45 (61 in the supine position).  Recommended auto CPAP with pressure range 5-20.  **Spirometry  01/19/2018>> FVC 75% predicted, FEV1 of 76% predicted ratio is 77%.  Overall this test shows mild restriction.  Medication:    Current Outpatient Medications:  .  amLODipine-benazepril (LOTREL) 10-40 MG capsule, TAKE 1 CAPSULE DAILY, Disp: 90 capsule, Rfl: 0 .  carvedilol (COREG) 6.25 MG tablet, TAKE 1 TABLET TWICE A DAY, Disp: 180 tablet, Rfl: 1 .  chlorthalidone (HYGROTON) 25 MG tablet, TAKE 1 TABLET DAILY, Disp: 90 tablet, Rfl: 0 .  Cholecalciferol (VITAMIN D-3) 1000 units CAPS, Take 1 capsule (1,000 Units total) by mouth daily., Disp: , Rfl:  .  neomycin-polymyxin-hydrocortisone (CORTISPORIN) 3.5-10000-1 OTIC suspension, Place 4 drops into both ears 3 (three) times daily. X 5 days, Disp: 10 mL, Rfl: 0 .  Omega-3 Fatty Acids (FISH OIL) 1000 MG CAPS, Take 1,000 mg by mouth daily., Disp: , Rfl:  .  pravastatin (PRAVACHOL) 40 MG tablet, TAKE 1 TABLET AT BEDTIME, Disp: 90 tablet, Rfl: 1   Allergies:  Atorvastatin   Review of Systems:  Constitutional: Feels well. Cardiovascular: Denies chest pain, exertional chest pain.  Pulmonary: Denies hemoptysis, pleuritic chest pain.   The remainder of systems were reviewed and were found to be negative other than  what is documented in the HPI.       LABORATORY PANEL:   CBC No results for input(s): WBC, HGB, HCT, PLT in the last 168 hours. ------------------------------------------------------------------------------------------------------------------  Chemistries  No results for input(s): NA, K, CL, CO2, GLUCOSE, BUN, CREATININE, CALCIUM, MG, AST, ALT, ALKPHOS, BILITOT in the last 168 hours.  Invalid  input(s): GFRCGP ------------------------------------------------------------------------------------------------------------------  Cardiac Enzymes No results for input(s): TROPONINI in the last 168 hours. ------------------------------------------------------------  RADIOLOGY:   No results found for this or any previous visit. No results found for this or any previous visit. ------------------------------------------------------------------------------------------------------------------  Thank  you for allowing [facility] to assist in the care of your patient. Our recommendations are noted above.  Please contact us if we can be of further service.   Marda Stalker, M.D., F.C.C.P.  Board Certified in Internal Medicine, Pulmonary Medicine, [place], and Sleep Medicine.  [facility] Office Number: [phone]  04/25/2019

## 2019-04-26 ENCOUNTER — Ambulatory Visit (INDEPENDENT_AMBULATORY_CARE_PROVIDER_SITE_OTHER): Payer: Medicare HMO | Admitting: Internal Medicine

## 2019-04-26 ENCOUNTER — Telehealth: Payer: Self-pay

## 2019-04-26 DIAGNOSIS — G4733 Obstructive sleep apnea (adult) (pediatric): Secondary | ICD-10-CM | POA: Diagnosis not present

## 2019-04-26 MED ORDER — MECLIZINE HCL 25 MG PO TABS: 25.0000 mg | ORAL_TABLET | Freq: Three times a day (TID) | ORAL | 0 refills | Status: DC | PRN

## 2019-04-26 NOTE — Telephone Encounter (Signed)
Patient states that he is still dizzy and wanted to know if he could have some Meclizine sent in. Honor Loh, NP approved will send in to [facility] per patient request.

## 2019-04-26 NOTE — Patient Instructions (Signed)
Continue to use CPAp every night for the whole night.

## 2019-05-12 DIAGNOSIS — G4733 Obstructive sleep apnea (adult) (pediatric): Secondary | ICD-10-CM | POA: Diagnosis not present

## 2019-06-07 ENCOUNTER — Other Ambulatory Visit: Payer: Self-pay | Admitting: Family Medicine

## 2019-06-08 NOTE — Telephone Encounter (Signed)
Requested medication (s) are due for refill today: yes  Requested medication (s) are on the active medication list: yes  Last refill: 03/09/2019  Future visit scheduled: yes  Notes to clinic:  Review for refill   Requested Prescriptions  Pending Prescriptions Disp Refills   chlorthalidone (HYGROTON) 25 MG tablet [Pharmacy Med Name: CHLORTHALID  TAB 25MG ] 90 tablet 0    Sig: TAKE 1 TABLET DAILY     Cardiovascular: Diuretics - Thiazide Failed - 06/07/2019 11:12 PM      Failed - Last BP in normal range    BP Readings from Last 1 Encounters:  04/21/19 (!) 155/70         Passed - Ca in normal range and within 360 days    Calcium  Date Value Ref Range Status  02/03/2019 9.9 8.6 - 10.3 mg/dL Final         Passed - Cr in normal range and within 360 days    Creat  Date Value Ref Range Status  02/03/2019 1.22 0.70 - 1.25 mg/dL Final    Comment:    For patients >104 years of age, the reference limit for Creatinine is approximately 13% higher for people identified as African-American. .          Passed - K in normal range and within 360 days    Potassium  Date Value Ref Range Status  02/03/2019 4.4 3.5 - 5.3 mmol/L Final         Passed - Na in normal range and within 360 days    Sodium  Date Value Ref Range Status  02/03/2019 140 135 - 146 mmol/L Final  06/18/2015 142 134 - 144 mmol/L Final    Comment:    **Effective July 02, 2015 the reference interval**   for Sodium, Serum will be changing to:                                             136 - 144          Passed - Valid encounter within last 6 months    Recent Outpatient Visits          3 months ago Hypertensive nephrosclerosis, stage 1 through stage 4 or unspecified chronic kidney disease   [place], NP   4 months ago Acute left ankle pain   [place], NP   7 months ago Hypertensive nephrosclerosis, stage 1 through stage 4  or unspecified chronic kidney disease   [place], Satira Anis, MD   1 year ago Welcome to [employer] preventive visit   [place], Satira Anis, MD   2 years ago Hypercholesterolemia   [place], Satira Anis, MD      Future Appointments            In 6 days Delsa Grana, PA-C [facility] [place]   In 3 weeks  [place]   In 1 month [place], Herbert Seta, [place]

## 2019-06-10 DIAGNOSIS — G4733 Obstructive sleep apnea (adult) (pediatric): Secondary | ICD-10-CM | POA: Diagnosis not present

## 2019-06-14 ENCOUNTER — Other Ambulatory Visit: Payer: Self-pay

## 2019-06-14 ENCOUNTER — Encounter: Payer: Self-pay | Admitting: Family Medicine

## 2019-06-14 ENCOUNTER — Ambulatory Visit (INDEPENDENT_AMBULATORY_CARE_PROVIDER_SITE_OTHER): Payer: Medicare HMO | Admitting: Family Medicine

## 2019-06-14 VITALS — BP 139/70 | Wt 242.0 lb

## 2019-06-14 DIAGNOSIS — E7800 Pure hypercholesterolemia, unspecified: Secondary | ICD-10-CM | POA: Diagnosis not present

## 2019-06-14 DIAGNOSIS — Z5181 Encounter for therapeutic drug level monitoring: Secondary | ICD-10-CM

## 2019-06-14 DIAGNOSIS — N183 Chronic kidney disease, stage 3 unspecified: Secondary | ICD-10-CM

## 2019-06-14 DIAGNOSIS — R7303 Prediabetes: Secondary | ICD-10-CM | POA: Diagnosis not present

## 2019-06-14 DIAGNOSIS — J449 Chronic obstructive pulmonary disease, unspecified: Secondary | ICD-10-CM | POA: Diagnosis not present

## 2019-06-14 DIAGNOSIS — I10 Essential (primary) hypertension: Secondary | ICD-10-CM

## 2019-06-14 DIAGNOSIS — I129 Hypertensive chronic kidney disease with stage 1 through stage 4 chronic kidney disease, or unspecified chronic kidney disease: Secondary | ICD-10-CM

## 2019-06-14 NOTE — Progress Notes (Signed)
Name: Todd Hutchinson   MRN: [id number]    DOB: [birth year]   Date:06/14/2019       Progress Note  Subjective:    Chief Complaint  Chief Complaint  Patient presents with  . Follow-up    I connected with  Todd Hutchinson  on 06/14/19 at  9:00 AM EDT by a video enabled telemedicine application and verified that I am speaking with the correct person using two identifiers.  I discussed the limitations of evaluation and management by telemedicine and the availability of in person appointments. The patient expressed understanding and agreed to proceed. Staff also discussed with the patient that there may be a patient responsible charge related to this service. Patient Location: Home Provider Location: [facility] clinic Additional Individuals present: Wife   HPI  Hypertension:  Pt diagnosed with HTN >10 ago Currently managed on amlodipine-benazepril, coreg BID, chlorthalidone Pt reports good med compliance and denies any SE.  No lightheadedness, hypotension, syncope. Blood pressure today is well controlled. BP Readings from Last 3 Encounters:  06/14/19 139/70  04/21/19 (!) 155/70  02/09/19 (!) 146/65   Pt denies CP, SOB, exertional sx, LE edema, palpitation, Ha's, visual disturbances Dietary efforts for BP?  Trying low salt, not much diet and exercise efforts   Current Medication Regimen: Pravastin 40 mg Last Lipids: Lab Results  Component Value Date   CHOL 165 02/03/2019   HDL 52 02/03/2019   LDLCALC 90 02/03/2019   TRIG 132 02/03/2019   CHOLHDL 3.2 02/03/2019   - Current Diet:   Poor diet efforts - Denies: Chest pain, shortness of breath, myalgias. - Documented aortic atherosclerosis? Yes - Risk factors for atherosclerosis: hypercholesterolemia and hypertension  CKD - Sees Dr. Juleen China annually, no visit since last routine f/up here.  Prediabetes - last A1C 5.8, not on meds, not working on diet as per above.   Weight has been stable. Wt Readings from Last 5  Encounters:  06/14/19 242 lb (109.8 kg)  04/21/19 240 lb (108.9 kg)  02/09/19 240 lb (108.9 kg)  01/12/19 239 lb (108.4 kg)  11/02/18 244 lb 3.2 oz (110.8 kg)   BMI Readings from Last 5 Encounters:  06/14/19 37.90 kg/m  04/21/19 37.59 kg/m  02/09/19 37.59 kg/m  01/12/19 37.43 kg/m  11/02/18 38.25 kg/m     Patient Active Problem List   Diagnosis Date Noted  . Morbid obesity ([place]) 11/02/2018  . History of colonic polyps   . Benign neoplasm of ascending colon   . Benign neoplasm of cecum   . Hypertensive nephrosclerosis 01/27/2018  . Prediabetes 06/30/2017  . Screening for AAA (abdominal aortic aneurysm) 06/30/2017  . Welcome to Medicare preventive visit 06/30/2017  . COPD (chronic obstructive pulmonary disease) ([place]) 04/15/2017  . Hx of tobacco use, presenting hazards to health 04/17/2016  . Abnormal weight gain 03/13/2016  . Hypercholesterolemia 03/13/2016  . Medication monitoring encounter 03/13/2016  . Chronic kidney disease, stage III (moderate) ([place]) 07/10/2015  . Annual physical exam 04/10/2015  . Genuine stress incontinence, male 06/23/2012  . Cystitis, radiation 05/31/2012  . Hematuria, microscopic 05/31/2012  . ED (erectile dysfunction) of organic origin 05/31/2012  . Hx of malignant neoplasm of prostate 05/31/2012  . Microscopic hematuria 05/31/2012    Past Surgical History:  Procedure Laterality Date  . COLONOSCOPY WITH PROPOFOL N/A 07/29/2018   Procedure: COLONOSCOPY WITH BIOPSIES;  Surgeon: Lucilla Lame, MD;  Location: [place];  Service: Endoscopy;  Laterality: N/A;  . POLYPECTOMY N/A 07/29/2018   Procedure: POLYPECTOMY;  Surgeon: Lucilla Lame, MD;  Location: [place];  Service: Endoscopy;  Laterality: N/A;  . PROSTATE SURGERY      Family History  Problem Relation Age of Onset  . Hypertension Mother   . Aneurysm Mother 98       brain  . Hypertension Father   . Heart attack Father   . Hypertension Sister   . Hypertension  Brother   . Heart failure Brother   . Heart disease Paternal Grandfather   . Hypertension Sister   . Hypertension Brother   . Cancer Brother        throat cancer  . Kidney cancer Neg Hx   . Kidney failure Neg Hx   . Prostate cancer Neg Hx   . Sickle cell anemia Neg Hx   . Tuberculosis Neg Hx     Social History   Socioeconomic History  . Marital status: Married    Spouse name: Dina Rich   . Number of children: 1  . Years of education: Not on file  . Highest education level: 12th grade  Occupational History    Comment: Retired   Scientific laboratory technician  . Financial resource strain: Not hard at all  . Food insecurity    Worry: Never true    Inability: Never true  . Transportation needs    Medical: No    Non-medical: No  Tobacco Use  . Smoking status: Former Smoker    Packs/day: 1.00    Years: 40.00    Pack years: 40.00    Quit date: 11/2013    Years since quitting: 5.5  . Smokeless tobacco: Never Used  Substance and Sexual Activity  . Alcohol use: Yes    Alcohol/week: 16.0 standard drinks    Types: 16 Shots of liquor per week    Comment: no more than 4 whiskey drinks a day; maybe 4 times day   . Drug use: No  . Sexual activity: Never    Partners: Female  Lifestyle  . Physical activity    Days per week: 6 days    Minutes per session: 30 min  . Stress: Not at all  Relationships  . Social connections    Talks on phone: More than three times a week    Gets together: Once a week    Attends religious service: More than 4 times per year    Active member of club or organization: No    Attends meetings of clubs or organizations: Never    Relationship status: Married  . Intimate partner violence    Fear of current or ex partner: No    Emotionally abused: No    Physically abused: No    Forced sexual activity: No  Other Topics Concern  . Not on file  Social History Narrative  . Not on file     Current Outpatient Medications:  .  amLODipine-benazepril (LOTREL) 10-40 MG  capsule, TAKE 1 CAPSULE DAILY, Disp: 90 capsule, Rfl: 0 .  carvedilol (COREG) 6.25 MG tablet, TAKE 1 TABLET TWICE A DAY, Disp: 180 tablet, Rfl: 1 .  chlorthalidone (HYGROTON) 25 MG tablet, TAKE 1 TABLET DAILY, Disp: 90 tablet, Rfl: 0 .  Cholecalciferol (VITAMIN D-3) 1000 units CAPS, Take 1 capsule (1,000 Units total) by mouth daily., Disp: , Rfl:  .  Omega-3 Fatty Acids (FISH OIL) 1000 MG CAPS, Take 1,000 mg by mouth daily., Disp: , Rfl:  .  pravastatin (PRAVACHOL) 40 MG tablet, TAKE 1 TABLET AT BEDTIME, Disp: 90 tablet, Rfl: 1 .  meclizine (ANTIVERT) 25 MG tablet, Take 1 tablet (25 mg total) by mouth 3 (three) times daily as needed for dizziness. (Patient not taking: Reported on 06/14/2019), Disp: 30 tablet, Rfl: 0 .  neomycin-polymyxin-hydrocortisone (CORTISPORIN) 3.5-10000-1 OTIC suspension, Place 4 drops into both ears 3 (three) times daily. X 5 days (Patient not taking: Reported on 06/14/2019), Disp: 10 mL, Rfl: 0  Allergies  Allergen Reactions  . Atorvastatin Rash    I personally reviewed active problem list, medication list, allergies, notes from last encounter, lab results with the patient/caregiver today.  Review of Systems  Constitutional: Negative.   HENT: Negative.   Eyes: Negative.   Respiratory: Negative.   Cardiovascular: Negative.   Gastrointestinal: Negative.   Endocrine: Negative.   Genitourinary: Negative.   Musculoskeletal: Negative.   Skin: Negative.   Allergic/Immunologic: Negative.   Neurological: Negative.   Hematological: Negative.   Psychiatric/Behavioral: Negative.   All other systems reviewed and are negative.     Objective:    Virtual encounter, vitals limited, only able to obtain the following Today's Vitals   06/14/19 0852 06/14/19 0853  BP: (!) 150/68 139/70  Weight: 242 lb (109.8 kg)    Body mass index is 37.9 kg/m. Nursing Note and Vital Signs reviewed.  Physical Exam Vitals signs and nursing note reviewed.  Constitutional:       General: He is not in acute distress.    Appearance: Normal appearance. He is well-developed. He is obese. He is not ill-appearing, toxic-appearing or diaphoretic.  HENT:     Head: Normocephalic and atraumatic.     Nose: Nose normal.  Eyes:     General:        Right eye: No discharge.        Left eye: No discharge.     Conjunctiva/sclera: Conjunctivae normal.  Neck:     Trachea: No tracheal deviation.  Pulmonary:     Effort: Pulmonary effort is normal. No respiratory distress.     Breath sounds: No stridor.  Musculoskeletal: Normal range of motion.  Skin:    Coloration: Skin is not jaundiced.     Findings: No rash.  Neurological:     Mental Status: He is alert.     Coordination: Coordination normal.  Psychiatric:        Mood and Affect: Mood normal.        Behavior: Behavior normal.     PE limited by telephone encounter  No results found for this or any previous visit (from the past 72 hour(s)).  PHQ2/9: Depression screen [facility] 2/9 06/14/2019 02/09/2019 01/12/2019 11/02/2018 07/02/2018  Decreased Interest 0 0 0 0 0  Down, Depressed, Hopeless 0 0 0 0 0  PHQ - 2 Score 0 0 0 0 0  Altered sleeping 0 0 0 0 -  Tired, decreased energy 0 0 0 0 -  Change in appetite 0 0 0 0 -  Feeling bad or failure about yourself  0 0 0 0 -  Trouble concentrating 0 0 0 0 -  Moving slowly or fidgety/restless 0 0 0 0 -  Suicidal thoughts 0 0 0 0 -  PHQ-9 Score 0 0 0 0 -  Difficult doing work/chores Not difficult at all Not difficult at all Not difficult at all Not difficult at all -   PHQ-2/9 Result is negative.    Fall Risk: Fall Risk  06/14/2019 02/09/2019 11/02/2018 07/02/2018 06/30/2017  Falls in the past year? 1 0 0 No No  Number falls in past yr: 0 - - - -  Injury with Fall? 0 - - - -     Assessment and Plan:       ICD-10-CM   1. Essential hypertension  [id number] COMPLETE METABOLIC PANEL WITH GFR    Lipid panel    Hemoglobin A1c   well controlled, advised to work on diet and exercise,  monitor BP and f/up if over 140/90  2. Hypercholesterolemia  [id number] COMPLETE METABOLIC PANEL WITH GFR    Lipid panel    Hemoglobin A1c   good med compliance, last labs reviewed, needs labs with next OV to monitor.  Dietary changes for HLD discussed today  3. Prediabetes  [id number] COMPLETE METABOLIC PANEL WITH GFR    Lipid panel    Hemoglobin A1c   well controlled with lifestyle, continue to monitor, needs to work on weightloss, diet and exercise or at risk for developing DM  4. Chronic kidney disease, stage III (moderate) (HCC)  0000000 COMPLETE METABOLIC PANEL WITH GFR    Lipid panel    Hemoglobin A1c   per nephro  5. Chronic obstructive pulmonary disease, unspecified COPD type ([place]) Chronic J44.9    denies current sx  6. Morbid obesity (HCC)  123456 COMPLETE METABOLIC PANEL WITH GFR    Lipid panel    Hemoglobin A1c   as per above, decrease calories and increase activity  7. Encounter for medication monitoring  Z51.81      I discussed the assessment and treatment plan with the patient. The patient was provided an opportunity to ask questions and all were answered. The patient agreed with the plan and demonstrated an understanding of the instructions.  The patient was advised to call back or seek an in-person evaluation if the symptoms worsen or if the condition fails to improve as anticipated.  I provided 20 minutes of non-face-to-face time during this encounter.  Delsa Grana, PA-C 09/29/209:20 AM

## 2019-06-20 DIAGNOSIS — R69 Illness, unspecified: Secondary | ICD-10-CM | POA: Diagnosis not present

## 2019-06-28 DIAGNOSIS — R69 Illness, unspecified: Secondary | ICD-10-CM | POA: Diagnosis not present

## 2019-07-04 ENCOUNTER — Other Ambulatory Visit: Payer: Self-pay | Admitting: Family Medicine

## 2019-07-05 ENCOUNTER — Other Ambulatory Visit: Payer: Self-pay

## 2019-07-05 ENCOUNTER — Ambulatory Visit (INDEPENDENT_AMBULATORY_CARE_PROVIDER_SITE_OTHER): Payer: Medicare HMO

## 2019-07-05 VITALS — BP 145/72 | Ht 67.0 in | Wt 243.0 lb

## 2019-07-05 DIAGNOSIS — Z0000 Encounter for general adult medical examination without abnormal findings: Secondary | ICD-10-CM | POA: Diagnosis not present

## 2019-07-05 MED ORDER — PRAVASTATIN SODIUM 40 MG PO TABS: 40.0000 mg | ORAL_TABLET | Freq: Every day | ORAL | 1 refills | Status: DC

## 2019-07-05 NOTE — Patient Instructions (Signed)
[name] , Thank you for taking time to come for your Medicare Wellness Visit. I appreciate your ongoing commitment to your health goals. Please review the following plan we discussed and let me know if I can assist you in the future.   Screening recommendations/referrals: Colonoscopy: done 07/29/18. Repeat in 2024 Recommended yearly ophthalmology/optometry visit for glaucoma screening and checkup Recommended yearly dental visit for hygiene and checkup  Vaccinations: Influenza vaccine: done 06/20/19 Pneumococcal vaccine: done 04/15/17 Tdap vaccine: due - please contact us if you get a cut or scrape on rust or metal Shingles vaccine: Shingrix discussed. Please contact your pharmacy for coverage information.   Advanced directives: Please bring a copy of your health care power of attorney and living will to the office at your convenience once you have completed those documents.   Conditions/risks identified: Recommend increasing physical activity and healthy eating for desired weight loss  Next appointment: Please follow up in one year for your Medicare Annual Wellness visit.    Preventive Care 74 Years and Older, Male Preventive care refers to lifestyle choices and visits with your health care provider that can promote health and wellness. What does preventive care include?  A yearly physical exam. This is also called an annual well check.  Dental exams once or twice a year.  Routine eye exams. Ask your health care provider how often you should have your eyes checked.  Personal lifestyle choices, including:  Daily care of your teeth and gums.  Regular physical activity.  Eating a healthy diet.  Avoiding tobacco and drug use.  Limiting alcohol use.  Practicing safe sex.  Taking low doses of aspirin every day.  Taking vitamin and mineral supplements as recommended by your health care provider. What happens during an annual well check? The services and screenings done by  your health care provider during your annual well check will depend on your age, overall health, lifestyle risk factors, and family history of disease. Counseling  Your health care provider may ask you questions about your:  Alcohol use.  Tobacco use.  Drug use.  Emotional well-being.  Home and relationship well-being.  Sexual activity.  Eating habits.  History of falls.  Memory and ability to understand (cognition).  Work and work Statistician. Screening  You may have the following tests or measurements:  Height, weight, and BMI.  Blood pressure.  Lipid and cholesterol levels. These may be checked every 5 years, or more frequently if you are over 56 years old.  Skin check.  Lung cancer screening. You may have this screening every year starting at age 36 if you have a 30-pack-year history of smoking and currently smoke or have quit within the past 15 years.  Fecal occult blood test (FOBT) of the stool. You may have this test every year starting at age 71.  Flexible sigmoidoscopy or colonoscopy. You may have a sigmoidoscopy every 5 years or a colonoscopy every 10 years starting at age 47.  Prostate cancer screening. Recommendations will vary depending on your family history and other risks.  Hepatitis C blood test.  Hepatitis B blood test.  Sexually transmitted disease (STD) testing.  Diabetes screening. This is done by checking your blood sugar (glucose) after you have not eaten for a while (fasting). You may have this done every 1-3 years.  Abdominal aortic aneurysm (AAA) screening. You may need this if you are a current or former smoker.  Osteoporosis. You may be screened starting at age 103 if you are at high  risk. Talk with your health care provider about your test results, treatment options, and if necessary, the need for more tests. Vaccines  Your health care provider may recommend certain vaccines, such as:  Influenza vaccine. This is recommended every  year.  Tetanus, diphtheria, and acellular pertussis (Tdap, Td) vaccine. You may need a Td booster every 10 years.  Zoster vaccine. You may need this after age 52.  Pneumococcal 13-valent conjugate (PCV13) vaccine. One dose is recommended after age 26.  Pneumococcal polysaccharide (PPSV23) vaccine. One dose is recommended after age 89. Talk to your health care provider about which screenings and vaccines you need and how often you need them. This information is not intended to replace advice given to you by your health care provider. Make sure you discuss any questions you have with your health care provider. Document Released: 09/28/2015 Document Revised: 05/21/2016 Document Reviewed: 07/03/2015 Elsevier Interactive Patient Education  2017 [employer] Prevention in the Home Falls can cause injuries. They can happen to people of all ages. There are many things you can do to make your home safe and to help prevent falls. What can I do on the outside of my home?  Regularly fix the edges of walkways and driveways and fix any cracks.  Remove anything that might make you trip as you walk through a door, such as a raised step or threshold.  Trim any bushes or trees on the path to your home.  Use bright outdoor lighting.  Clear any walking paths of anything that might make someone trip, such as rocks or tools.  Regularly check to see if handrails are loose or broken. Make sure that both sides of any steps have handrails.  Any raised decks and porches should have guardrails on the edges.  Have any leaves, snow, or ice cleared regularly.  Use sand or salt on walking paths during winter.  Clean up any spills in your garage right away. This includes oil or grease spills. What can I do in the bathroom?  Use night lights.  Install grab bars by the toilet and in the tub and shower. Do not use towel bars as grab bars.  Use non-skid mats or decals in the tub or shower.  If you  need to sit down in the shower, use a plastic, non-slip stool.  Keep the floor dry. Clean up any water that spills on the floor as soon as it happens.  Remove soap buildup in the tub or shower regularly.  Attach bath mats securely with double-sided non-slip rug tape.  Do not have throw rugs and other things on the floor that can make you trip. What can I do in the bedroom?  Use night lights.  Make sure that you have a light by your bed that is easy to reach.  Do not use any sheets or blankets that are too big for your bed. They should not hang down onto the floor.  Have a firm chair that has side arms. You can use this for support while you get dressed.  Do not have throw rugs and other things on the floor that can make you trip. What can I do in the kitchen?  Clean up any spills right away.  Avoid walking on wet floors.  Keep items that you use a lot in easy-to-reach places.  If you need to reach something above you, use a strong step stool that has a grab bar.  Keep electrical cords out of the way.  Do not use floor polish or wax that makes floors slippery. If you must use wax, use non-skid floor wax.  Do not have throw rugs and other things on the floor that can make you trip. What can I do with my stairs?  Do not leave any items on the stairs.  Make sure that there are handrails on both sides of the stairs and use them. Fix handrails that are broken or loose. Make sure that handrails are as long as the stairways.  Check any carpeting to make sure that it is firmly attached to the stairs. Fix any carpet that is loose or worn.  Avoid having throw rugs at the top or bottom of the stairs. If you do have throw rugs, attach them to the floor with carpet tape.  Make sure that you have a light switch at the top of the stairs and the bottom of the stairs. If you do not have them, ask someone to add them for you. What else can I do to help prevent falls?  Wear shoes that:   Do not have high heels.  Have rubber bottoms.  Are comfortable and fit you well.  Are closed at the toe. Do not wear sandals.  If you use a stepladder:  Make sure that it is fully opened. Do not climb a closed stepladder.  Make sure that both sides of the stepladder are locked into place.  Ask someone to hold it for you, if possible.  Clearly mark and make sure that you can see:  Any grab bars or handrails.  First and last steps.  Where the edge of each step is.  Use tools that help you move around (mobility aids) if they are needed. These include:  Canes.  Walkers.  Scooters.  Crutches.  Turn on the lights when you go into a dark area. Replace any light bulbs as soon as they burn out.  Set up your furniture so you have a clear path. Avoid moving your furniture around.  If any of your floors are uneven, fix them.  If there are any pets around you, be aware of where they are.  Review your medicines with your doctor. Some medicines can make you feel dizzy. This can increase your chance of falling. Ask your doctor what other things that you can do to help prevent falls. This information is not intended to replace advice given to you by your health care provider. Make sure you discuss any questions you have with your health care provider. Document Released: 06/28/2009 Document Revised: 02/07/2016 Document Reviewed: 10/06/2014 Elsevier Interactive Patient Education  2017 [employer].

## 2019-07-05 NOTE — Progress Notes (Signed)
Subjective:   Todd Hutchinson is a 68 y.o. male who presents for Medicare Annual/Subsequent preventive examination.  Virtual Visit via Telephone Note  I connected with Todd Hutchinson on 07/05/19 at  9:20 AM EDT by telephone and verified that I am speaking with the correct person using two identifiers.  Medicare Annual Wellness visit completed telephonically due to Covid-19 pandemic.   Location: Patient: home Provider: office   I discussed the limitations, risks, security and privacy concerns of performing an evaluation and management service by telephone and the availability of in person appointments. The patient expressed understanding and agreed to proceed.  Some vital signs may be absent or patient reported.   Clemetine Marker, LPN    Review of Systems:   Cardiac Risk Factors include: obesity (BMI >30kg/m2);advanced age (>1men, >18 women);dyslipidemia;hypertension;male gender     Objective:    Vitals: BP (!) 145/72    Ht 5\' 7"  (1.702 m)    Wt 243 lb (110.2 kg)    BMI 38.06 kg/m   Body mass index is 38.06 kg/m.  Advanced Directives 07/05/2019 04/21/2019 07/29/2018 06/30/2017 04/15/2017 10/15/2016 03/13/2016  Does Patient Have a Medical Advance Directive? No No No No Yes No Yes  Type of Advance Directive - - - - - - Living will  Does patient want to make changes to medical advance directive? - - - - - - -  Copy of [place] in Chart? - - - - - - No - copy requested  Would patient like information on creating a medical advance directive? No - Patient declined - No - Patient declined - - - -    Tobacco Social History   Tobacco Use  Smoking Status Former Smoker   Packs/day: 1.00   Years: 40.00   Pack years: 40.00   Quit date: 11/2013   Years since quitting: 5.6  Smokeless Tobacco Never Used     Counseling given: Not Answered   Clinical Intake:  Pre-visit preparation completed: Yes  Pain : No/denies pain     BMI - recorded:  38.06 Nutritional Status: BMI > 30  Obese Nutritional Risks: None Diabetes: No  How often do you need to have someone help you when you read instructions, pamphlets, or other written materials from your doctor or pharmacy?: 1 - Never  Interpreter Needed?: No  Information entered by :: Clemetine Marker LPN  Past Medical History:  Diagnosis Date   Abnormal weight gain 03/13/2016   COPD (chronic obstructive pulmonary disease) ([place])    Hx of malignant neoplasm of prostate 05/31/2012   Hx of tobacco use, presenting hazards to health 04/17/2016   Hypercholesterolemia 03/13/2016   Hyperlipidemia    Hypertension    Hypertensive nephrosclerosis 01/27/2018   Prostate cancer ([place])    Sleep apnea    uses CPAP   Past Surgical History:  Procedure Laterality Date   COLONOSCOPY WITH PROPOFOL N/A 07/29/2018   Procedure: COLONOSCOPY WITH BIOPSIES;  Surgeon: Lucilla Lame, MD;  Location: [facility];  Service: Endoscopy;  Laterality: N/A;   POLYPECTOMY N/A 07/29/2018   Procedure: POLYPECTOMY;  Surgeon: Lucilla Lame, MD;  Location: [facility];  Service: Endoscopy;  Laterality: N/A;   PROSTATE SURGERY     Family History  Problem Relation Age of Onset   Hypertension Mother    Aneurysm Mother 37       brain   Hypertension Father    Heart attack Father    Hypertension Sister    Hypertension Brother  Heart failure Brother    Heart disease Paternal Grandfather    Hypertension Sister    Hypertension Brother    Cancer Brother        throat cancer   Kidney cancer Neg Hx    Kidney failure Neg Hx    Prostate cancer Neg Hx    Sickle cell anemia Neg Hx    Tuberculosis Neg Hx    Social History   Socioeconomic History   Marital status: Married    Spouse name: Dina Rich    Number of children: 1   Years of education: Not on file   Highest education level: 12th grade  Occupational History    Comment: Retired   Scientist, product/process development strain:  Not hard at [employer] insecurity    Worry: Never true    Inability: Never true   Transportation needs    Medical: No    Non-medical: No  Tobacco Use   Smoking status: Former Smoker    Packs/day: 1.00    Years: 40.00    Pack years: 40.00    Quit date: 11/2013    Years since quitting: 5.6   Smokeless tobacco: Never Used  Substance and Sexual Activity   Alcohol use: Yes    Alcohol/week: 16.0 standard drinks    Types: 16 Shots of liquor per week    Comment: no more than 4 whiskey drinks a day; maybe 4 times day    Drug use: No   Sexual activity: Never    Partners: Female  Lifestyle   Physical activity    Days per week: 0 days    Minutes per session: 0 min   Stress: Not at all  Relationships   Social connections    Talks on phone: More than three times a week    Gets together: Once a week    Attends religious service: More than 4 times per year    Active member of club or organization: No    Attends meetings of clubs or organizations: Never    Relationship status: Married  Other Topics Concern   Not on file  Social History Narrative   Not on file    Outpatient Encounter Medications as of 07/05/2019  Medication Sig   amLODipine-benazepril (LOTREL) 10-40 MG capsule TAKE 1 CAPSULE DAILY   calcium citrate-vitamin D (CITRACAL+D) 315-200 MG-UNIT tablet Take 1 tablet by mouth daily.   carvedilol (COREG) 6.25 MG tablet TAKE 1 TABLET TWICE A DAY   chlorthalidone (HYGROTON) 25 MG tablet TAKE 1 TABLET DAILY   Cholecalciferol (VITAMIN D-3) 1000 units CAPS Take 1 capsule (1,000 Units total) by mouth daily.   Omega-3 Fatty Acids (FISH OIL) 1000 MG CAPS Take 1,000 mg by mouth daily.   pravastatin (PRAVACHOL) 40 MG tablet TAKE 1 TABLET AT BEDTIME   No facility-administered encounter medications on file as of 07/05/2019.     Activities of Daily Living In your present state of health, do you have any difficulty performing the following activities: 07/05/2019  06/14/2019  Hearing? N N  Comment declines hearing aids -  Vision? N N  Difficulty concentrating or making decisions? N N  Walking or climbing stairs? N N  Dressing or bathing? N N  Doing errands, shopping? N N  Preparing Food and eating ? N -  Using the Toilet? N -  In the past six months, have you accidently leaked urine? N -  Do you have problems with loss of bowel control? N -  Managing your Medications? N -  Managing your Finances? N -  Housekeeping or managing your Housekeeping? N -  Some recent data might be hidden    Patient Care Team: Delsa Grana, PA-C as PCP - General (Family Medicine) Lucilla Lame, MD as Consulting Physician (Gastroenterology) Lavonia Dana, MD as Consulting Physician (Nephrology) Lamonte Richer, [place] as Consulting Physician (Urology) Laverle Hobby, MD (Inactive) as Consulting Physician (Pulmonary Disease)   Assessment:   This is a routine wellness examination for Daonte.  Exercise Activities and Dietary recommendations Current Exercise Habits: The patient does not participate in regular exercise at present, Exercise limited by: None identified  Goals     Weight (lb) < 220 lb (99.8 kg)     Plans to exercise more- 6 days a week, at least 30 minutes.        Fall Risk Fall Risk  07/05/2019 06/14/2019 02/09/2019 11/02/2018 07/02/2018  Falls in the past year? 1 1 0 0 No  Number falls in past yr: 1 0 - - -  Injury with Fall? 0 0 - - -  Follow up Falls prevention discussed - - - -   FALL RISK PREVENTION PERTAINING TO THE HOME:  Any stairs in or around the home? Yes  If so, do they handrails? Yes   Home free of loose throw rugs in walkways, pet beds, electrical cords, etc? Yes  Adequate lighting in your home to reduce risk of falls? Yes   ASSISTIVE DEVICES UTILIZED TO PREVENT FALLS:  Life alert? No  Use of a cane, walker or w/c? No  Grab bars in the bathroom? No  Shower chair or bench in shower? Yes  Elevated toilet seat or a  handicapped toilet? Yes   DME ORDERS:  DME order needed?  No   TIMED UP AND GO:  Was the test performed? No . Telephonic visit.   Education: Fall risk prevention has been discussed.  Intervention(s) required? No    Depression Screen PHQ 2/9 Scores 07/05/2019 06/14/2019 02/09/2019 01/12/2019  PHQ - 2 Score 0 0 0 0  PHQ- 9 Score - 0 0 0    Cognitive Function     6CIT Screen 07/05/2019 07/02/2018 06/30/2017  What Year? 0 points 0 points 0 points  What month? 0 points 0 points 0 points  What time? 0 points 0 points 0 points  Count back from 20 0 points 0 points 2 points  Months in reverse 0 points 0 points 0 points  Repeat phrase 0 points 0 points 0 points  Total Score 0 0 2    Immunization History  Administered Date(s) Administered   Fluad Quad(high Dose 65+) 06/20/2019   Influenza, High Dose Seasonal PF 06/04/2018   Influenza-Unspecified 06/04/2018   Pneumococcal Conjugate-13 04/15/2017   Pneumococcal Polysaccharide-23 06/18/2015    Qualifies for Shingles Vaccine? Yes  . Due for Shingrix. Education has been provided regarding the importance of this vaccine. Pt has been advised to call insurance company to determine out of pocket expense. Advised may also receive vaccine at local pharmacy or Health Dept. Verbalized acceptance and understanding.  Tdap: Although this vaccine is not a covered service during a Wellness Exam, does the patient still wish to receive this vaccine today?  No .  Education has been provided regarding the importance of this vaccine. Advised may receive this vaccine at local pharmacy or Health Dept. Aware to provide a copy of the vaccination record if obtained from local pharmacy or Health Dept. Verbalized acceptance and understanding.  Flu Vaccine: Up to date  Pneumococcal Vaccine: Up to date   Screening Tests Health Maintenance  Topic Date Due   INFLUENZA VACCINE  04/16/2019   TETANUS/TDAP  11/03/2019 (Originally 12/08/1969)   PNA vac  Low Risk Adult (2 of 2 - PPSV23) 06/17/2020   COLONOSCOPY  07/30/2023   Hepatitis C Screening  Completed   Cancer Screenings:  Colorectal Screening: Completed 07/29/18. Repeat every 5 years;   Lung Cancer Screening: (Low Dose CT Chest recommended if Age 58-80 years, 30 pack-year currently smoking OR have quit w/in 15years.) does qualify. Pt declines lung cancer screening at this time.    Additional Screening:  Hepatitis C Screening: does qualify; Completed 07/02/18  Vision Screening: Recommended annual ophthalmology exams for early detection of glaucoma and other disorders of the eye. Is the patient up to date with their annual eye exam?  Yes  Who is the provider or what is the name of the office in which the pt attends annual eye exams? Dr. Ellin Mayhew  Dental Screening: Recommended annual dental exams for proper oral hygiene  Community Resource Referral:  CRR required this visit?  No       Plan:    I have personally reviewed and addressed the Medicare Annual Wellness questionnaire and have noted the following in the patients chart:  A. Medical and social history B. Use of alcohol, tobacco or illicit drugs  C. Current medications and supplements D. Functional ability and status E.  Nutritional status F.  Physical activity G. Advance directives H. List of other physicians I.  Hospitalizations, surgeries, and ER visits in previous 12 months J.  [place] such as hearing and vision if needed, cognitive and depression L. Referrals and appointments   In addition, I have reviewed and discussed with patient certain preventive protocols, quality metrics, and best practice recommendations. A written personalized care plan for preventive services as well as general preventive health recommendations were provided to patient.   Signed,  Clemetine Marker, LPN Nurse Health Advisor   Nurse Notes: none

## 2019-07-11 DIAGNOSIS — G4733 Obstructive sleep apnea (adult) (pediatric): Secondary | ICD-10-CM | POA: Diagnosis not present

## 2019-07-12 ENCOUNTER — Other Ambulatory Visit: Payer: Medicare HMO

## 2019-07-12 ENCOUNTER — Other Ambulatory Visit: Payer: Self-pay

## 2019-07-12 DIAGNOSIS — C61 Malignant neoplasm of prostate: Secondary | ICD-10-CM

## 2019-07-13 LAB — PSA: Prostate Specific Ag, Serum: 0.2 ng/mL (ref 0.0–4.0)

## 2019-07-15 ENCOUNTER — Telehealth: Payer: Self-pay | Admitting: Urology

## 2019-07-15 NOTE — Telephone Encounter (Signed)
Pt requests a call for his PSA results, he states that he doesn't want to come into the office if he doesn't have to.

## 2019-07-15 NOTE — Telephone Encounter (Signed)
Patient can do over phone

## 2019-07-18 ENCOUNTER — Other Ambulatory Visit: Payer: Self-pay

## 2019-07-18 ENCOUNTER — Telehealth: Payer: Self-pay | Admitting: Urology

## 2019-07-18 ENCOUNTER — Telehealth (INDEPENDENT_AMBULATORY_CARE_PROVIDER_SITE_OTHER): Payer: Medicare HMO | Admitting: Urology

## 2019-07-18 DIAGNOSIS — Z8546 Personal history of malignant neoplasm of prostate: Secondary | ICD-10-CM | POA: Diagnosis not present

## 2019-07-18 NOTE — Progress Notes (Signed)
Virtual Visit via Telephone Note  I connected with Todd Hutchinson on 07/18/19 at 11:30 AM EST by telephone and verified that I am speaking with the correct person using two identifiers.   I discussed the limitations, risks, security and privacy concerns of performing an evaluation and management service by telephone and the availability of in person appointments. We discussed the impact of the COVID-19 pandemic on the healthcare system, and the importance of social distancing and reducing patient and provider exposure. I also discussed with the patient that there may be a patient responsible charge related to this service. The patient expressed understanding and agreed to proceed.  Reason for visit: History of prostate cancer  History of Present Illness: Briefly, [name] a 68 year old African-American male previously followed by Dr. Jacqlyn Larsen with history of open radical prostatectomy in 2000 with final pathology showing Gleason score 3+4 = 7 with negative margins and no extraprostatic extension, salvage radiation in 2006 for reported biochemical recurrence, and long-term Lupron androgen deprivation therapy. His PSA trend since 2014 has remained essentially undetectable on Lupron from 0-0.2.  At our visit in January 2020 his PSA remained stable at 0.2 from 0.2 after being off of Lupron for 9 months when he transitioned from Dr. Jacqlyn Larsen to our practice at [facility].  We discussed the risks and benefits of stopping Lupron and continuing to monitor the PSA, and he agreed to stop Lupron.  His repeat PSA on 07/12/2019 was stable at 0.2 from 0.2 previously.  He overall is doing well and denies any complaints of weight loss or bone pain.  He is happy to be off Lupron.  We had a long conversation today about the risks and benefits of Lupron, and if his PSA remains stably low off the Lupron this could significantly improve his quality of life and lower his risk of potential osteoporosis or cardiac side  effects from ADT.  We discussed the need to potentially resume ADT in the future if PSA rises significantly.  Follow Up: Repeat PSA in 6 months, virtual visit to discuss results Consider spacing to yearly PSA if stable over the next few years off Lupron   I discussed the assessment and treatment plan with the patient. The patient was provided an opportunity to ask questions and all were answered. The patient agreed with the plan and demonstrated an understanding of the instructions.   The patient was advised to call back or seek an in-person evaluation if the symptoms worsen or if the condition fails to improve as anticipated.  I provided 12 minutes of non-face-to-face time during this encounter.   Billey Co, MD

## 2019-07-18 NOTE — Telephone Encounter (Signed)
-----   Message from Billey Co, MD sent at 07/18/2019 11:32 AM EST ----- Regarding: follow up Please schedule virtual follow up in 6 mo with PSA prior, thanks  Nickolas Madrid, MD 07/18/2019

## 2019-07-18 NOTE — Telephone Encounter (Signed)
Appts made and reminders sent.

## 2019-07-22 ENCOUNTER — Other Ambulatory Visit: Payer: Self-pay

## 2019-07-22 DIAGNOSIS — I10 Essential (primary) hypertension: Secondary | ICD-10-CM

## 2019-07-22 MED ORDER — AMLODIPINE BESY-BENAZEPRIL HCL 10-40 MG PO CAPS: 1.0000 | ORAL_CAPSULE | Freq: Every day | ORAL | 0 refills | Status: DC

## 2019-08-05 ENCOUNTER — Encounter: Payer: Self-pay | Admitting: Family Medicine

## 2019-08-10 DIAGNOSIS — G4733 Obstructive sleep apnea (adult) (pediatric): Secondary | ICD-10-CM | POA: Diagnosis not present

## 2019-08-28 ENCOUNTER — Other Ambulatory Visit: Payer: Self-pay | Admitting: Family Medicine

## 2019-09-29 DIAGNOSIS — I739 Peripheral vascular disease, unspecified: Secondary | ICD-10-CM | POA: Diagnosis not present

## 2019-09-29 DIAGNOSIS — Z008 Encounter for other general examination: Secondary | ICD-10-CM | POA: Diagnosis not present

## 2019-09-29 DIAGNOSIS — N529 Male erectile dysfunction, unspecified: Secondary | ICD-10-CM | POA: Diagnosis not present

## 2019-09-29 DIAGNOSIS — Z8249 Family history of ischemic heart disease and other diseases of the circulatory system: Secondary | ICD-10-CM | POA: Diagnosis not present

## 2019-09-29 DIAGNOSIS — I10 Essential (primary) hypertension: Secondary | ICD-10-CM | POA: Diagnosis not present

## 2019-09-29 DIAGNOSIS — Z6839 Body mass index (BMI) 39.0-39.9, adult: Secondary | ICD-10-CM | POA: Diagnosis not present

## 2019-09-29 DIAGNOSIS — G4733 Obstructive sleep apnea (adult) (pediatric): Secondary | ICD-10-CM | POA: Diagnosis not present

## 2019-09-29 DIAGNOSIS — Z89412 Acquired absence of left great toe: Secondary | ICD-10-CM | POA: Diagnosis not present

## 2019-09-29 DIAGNOSIS — Z809 Family history of malignant neoplasm, unspecified: Secondary | ICD-10-CM | POA: Diagnosis not present

## 2019-09-29 DIAGNOSIS — E785 Hyperlipidemia, unspecified: Secondary | ICD-10-CM | POA: Diagnosis not present

## 2019-10-05 DIAGNOSIS — G4733 Obstructive sleep apnea (adult) (pediatric): Secondary | ICD-10-CM | POA: Diagnosis not present

## 2019-10-06 ENCOUNTER — Other Ambulatory Visit: Payer: Self-pay | Admitting: Family Medicine

## 2019-10-07 NOTE — Telephone Encounter (Signed)
Requested Prescriptions  Pending Prescriptions Disp Refills  . carvedilol (COREG) 6.25 MG tablet [Pharmacy Med Name: CARVEDILOL TAB 6.25MG ] 180 tablet 0    Sig: TAKE 1 TABLET TWICE A DAY     Cardiovascular:  Beta Blockers Failed - 10/06/2019  7:14 PM      Failed - Last BP in normal range    BP Readings from Last 1 Encounters:  07/05/19 (!) 145/72         Passed - Last Heart Rate in normal range    Pulse Readings from Last 1 Encounters:  04/21/19 79         Passed - Valid encounter within last 6 months    Recent Outpatient Visits          3 months ago Essential hypertension   [facility] [place], Kristeen Miss, PA-C   8 months ago Hypertensive nephrosclerosis, stage 1 through stage 4 or unspecified chronic kidney disease   [place], NP   8 months ago Acute left ankle pain   [place], NP   11 months ago Hypertensive nephrosclerosis, stage 1 through stage 4 or unspecified chronic kidney disease   [place], Satira Anis, MD   2 years ago [facility] [employer] preventive visit   [place], Satira Anis, MD      Future Appointments            In 1 month Delsa Grana, PA-C [facility] [place]   In 3 months [facility] [place], Herbert Seta, MD [place]   In 9 months  [facility], [facility]

## 2019-10-11 DIAGNOSIS — R69 Illness, unspecified: Secondary | ICD-10-CM | POA: Diagnosis not present

## 2019-10-19 DIAGNOSIS — H2513 Age-related nuclear cataract, bilateral: Secondary | ICD-10-CM | POA: Diagnosis not present

## 2019-10-19 DIAGNOSIS — E119 Type 2 diabetes mellitus without complications: Secondary | ICD-10-CM | POA: Diagnosis not present

## 2019-10-19 DIAGNOSIS — H35033 Hypertensive retinopathy, bilateral: Secondary | ICD-10-CM | POA: Diagnosis not present

## 2019-10-24 ENCOUNTER — Other Ambulatory Visit: Payer: Self-pay | Admitting: Family Medicine

## 2019-10-24 DIAGNOSIS — I10 Essential (primary) hypertension: Secondary | ICD-10-CM

## 2019-11-09 DIAGNOSIS — G4733 Obstructive sleep apnea (adult) (pediatric): Secondary | ICD-10-CM | POA: Diagnosis not present

## 2019-11-10 DIAGNOSIS — N183 Chronic kidney disease, stage 3 unspecified: Secondary | ICD-10-CM | POA: Diagnosis not present

## 2019-11-10 DIAGNOSIS — E7800 Pure hypercholesterolemia, unspecified: Secondary | ICD-10-CM | POA: Diagnosis not present

## 2019-11-10 DIAGNOSIS — I10 Essential (primary) hypertension: Secondary | ICD-10-CM | POA: Diagnosis not present

## 2019-11-10 DIAGNOSIS — R7303 Prediabetes: Secondary | ICD-10-CM | POA: Diagnosis not present

## 2019-11-11 ENCOUNTER — Encounter: Payer: Self-pay | Admitting: Family Medicine

## 2019-11-11 LAB — COMPLETE METABOLIC PANEL WITH GFR
AG Ratio: 1.5 (calc) (ref 1.0–2.5)
ALT: 16 U/L (ref 9–46)
AST: 12 U/L (ref 10–35)
Albumin: 4.4 g/dL (ref 3.6–5.1)
Alkaline phosphatase (APISO): 34 U/L — ABNORMAL LOW (ref 35–144)
BUN/Creatinine Ratio: 21 (calc) (ref 6–22)
BUN: 29 mg/dL — ABNORMAL HIGH (ref 7–25)
CO2: 25 mmol/L (ref 20–32)
Calcium: 10.3 mg/dL (ref 8.6–10.3)
Chloride: 104 mmol/L (ref 98–110)
Creat: 1.4 mg/dL — ABNORMAL HIGH (ref 0.70–1.25)
GFR, Est African American: 60 mL/min/{1.73_m2} (ref >=60)
GFR, Est Non African American: 52 mL/min/{1.73_m2} — ABNORMAL LOW (ref >=60)
Globulin: 3 g/dL (calc) (ref 1.9–3.7)
Glucose, Bld: 120 mg/dL — ABNORMAL HIGH (ref 65–99)
Potassium: 4.3 mmol/L (ref 3.5–5.3)
Sodium: 139 mmol/L (ref 135–146)
Total Bilirubin: 0.5 mg/dL (ref 0.2–1.2)
Total Protein: 7.4 g/dL (ref 6.1–8.1)

## 2019-11-11 LAB — LIPID PANEL
Cholesterol: 177 mg/dL (ref <200)
HDL: 59 mg/dL (ref >=40)
LDL Cholesterol (Calc): 96 mg/dL (calc)
Non-HDL Cholesterol (Calc): 118 mg/dL (calc) (ref <130)
Total CHOL/HDL Ratio: 3 (calc) (ref <5.0)
Triglycerides: 123 mg/dL (ref <150)

## 2019-11-11 LAB — HEMOGLOBIN A1C
Hgb A1c MFr Bld: 5.9 % of total Hgb — ABNORMAL HIGH (ref <5.7)
Mean Plasma Glucose: 123 (calc)
eAG (mmol/L): 6.8 (calc)

## 2019-11-12 DIAGNOSIS — Z23 Encounter for immunization: Secondary | ICD-10-CM | POA: Diagnosis not present

## 2019-11-16 ENCOUNTER — Other Ambulatory Visit: Payer: Self-pay

## 2019-11-16 ENCOUNTER — Ambulatory Visit (INDEPENDENT_AMBULATORY_CARE_PROVIDER_SITE_OTHER): Payer: Medicare HMO | Admitting: Family Medicine

## 2019-11-16 ENCOUNTER — Encounter: Payer: Self-pay | Admitting: Family Medicine

## 2019-11-16 VITALS — BP 152/70 | HR 93 | Temp 98.5°F | Resp 14 | Ht 67.0 in | Wt 251.7 lb

## 2019-11-16 DIAGNOSIS — J449 Chronic obstructive pulmonary disease, unspecified: Secondary | ICD-10-CM

## 2019-11-16 DIAGNOSIS — I10 Essential (primary) hypertension: Secondary | ICD-10-CM

## 2019-11-16 DIAGNOSIS — C61 Malignant neoplasm of prostate: Secondary | ICD-10-CM

## 2019-11-16 DIAGNOSIS — Z6839 Body mass index (BMI) 39.0-39.9, adult: Secondary | ICD-10-CM

## 2019-11-16 DIAGNOSIS — R7303 Prediabetes: Secondary | ICD-10-CM | POA: Diagnosis not present

## 2019-11-16 DIAGNOSIS — E7800 Pure hypercholesterolemia, unspecified: Secondary | ICD-10-CM

## 2019-11-16 MED ORDER — PRAVASTATIN SODIUM 40 MG PO TABS: 40.0000 mg | ORAL_TABLET | Freq: Every day | ORAL | 3 refills | Status: DC

## 2019-11-16 NOTE — Progress Notes (Signed)
Name: Fredirick Button   MRN: [id number]    DOB: 01-17-1951   Date:11/16/2019       Progress Note  Chief Complaint  Patient presents with  . Follow-up  . Hypertension  . Hyperlipidemia     Subjective:   Todd Hutchinson is a 69 y.o. male, presents to clinic for routine follow up on the conditions listed above.  Hypertension:  Currently managed on chlorthalidone, amlodipine, benazapril, coreg Pt reports good med compliance and denies any SE.  No lightheadedness, hypotension, syncope. Blood pressure today is elevated today Usually he monitors at home and BP is 130-140/60-70 BP Readings from Last 3 Encounters:  11/16/19 (!) 158/70  07/05/19 (!) 145/72  06/14/19 139/70  Pt denies CP, SOB, exertional sx, LE edema, palpitation, Ha's, visual disturbances  Hyperlipidemia: Current Medication Regimen:  Pravastatin 40 mg  Last Lipids: Lab Results  Component Value Date   CHOL 177 11/10/2019   HDL 59 11/10/2019   LDLCALC 96 11/10/2019   TRIG 123 11/10/2019   CHOLHDL 3.0 11/10/2019  - Denies: Chest pain, shortness of breath, myalgias. - Documented aortic atherosclerosis? No - Risk factors for atherosclerosis: hypercholesterolemia and hypertension  Prediabetes: Labs done 11/10/2019 Lab Results  Component Value Date   HGBA1C 5.9 (H) 11/10/2019   Obesity:  Weight increasing over the past year Wt Readings from Last 5 Encounters:  11/16/19 251 lb 11.2 oz (114.2 kg)  07/05/19 243 lb (110.2 kg)  06/14/19 242 lb (109.8 kg)  04/21/19 240 lb (108.9 kg)  02/09/19 240 lb (108.9 kg)   BMI Readings from Last 5 Encounters:  11/16/19 39.42 kg/m  07/05/19 38.06 kg/m  06/14/19 37.90 kg/m  04/21/19 37.59 kg/m  02/09/19 37.59 kg/m     Patient Active Problem List   Diagnosis Date Noted  . Morbid obesity ([place]) 11/02/2018  . History of colonic polyps   . Benign neoplasm of ascending colon   . Benign neoplasm of cecum   . Hypertensive nephrosclerosis 01/27/2018  .  Prediabetes 06/30/2017  . Screening for AAA (abdominal aortic aneurysm) 06/30/2017  . Welcome to Medicare preventive visit 06/30/2017  . COPD (chronic obstructive pulmonary disease) ([place]) 04/15/2017  . Hx of tobacco use, presenting hazards to health 04/17/2016  . Abnormal weight gain 03/13/2016  . Hypercholesterolemia 03/13/2016  . Medication monitoring encounter 03/13/2016  . Chronic kidney disease, stage III (moderate) 07/10/2015  . Annual physical exam 04/10/2015  . Genuine stress incontinence, male 06/23/2012  . Cystitis, radiation 05/31/2012  . Hematuria, microscopic 05/31/2012  . ED (erectile dysfunction) of organic origin 05/31/2012  . Hx of malignant neoplasm of prostate 05/31/2012  . Microscopic hematuria 05/31/2012    Past Surgical History:  Procedure Laterality Date  . COLONOSCOPY WITH PROPOFOL N/A 07/29/2018   Procedure: COLONOSCOPY WITH BIOPSIES;  Surgeon: Lucilla Lame, MD;  Location: [facility];  Service: Endoscopy;  Laterality: N/A;  . POLYPECTOMY N/A 07/29/2018   Procedure: POLYPECTOMY;  Surgeon: Lucilla Lame, MD;  Location: [place];  Service: Endoscopy;  Laterality: N/A;  . PROSTATE SURGERY      Family History  Problem Relation Age of Onset  . Hypertension Mother   . Aneurysm Mother 23       brain  . Hypertension Father   . Heart attack Father   . Hypertension Sister   . Hypertension Brother   . Heart failure Brother   . Heart disease Paternal Grandfather   . Hypertension Sister   . Hypertension Brother   . Cancer Brother  throat cancer  . Kidney cancer Neg Hx   . Kidney failure Neg Hx   . Prostate cancer Neg Hx   . Sickle cell anemia Neg Hx   . Tuberculosis Neg Hx     Social History   Tobacco Use  . Smoking status: Former Smoker    Packs/day: 1.00    Years: 40.00    Pack years: 40.00    Quit date: 11/2013    Years since quitting: 6.0  . Smokeless tobacco: Never Used  Substance Use Topics  . Alcohol use: Yes     Alcohol/week: 16.0 standard drinks    Types: 16 Shots of liquor per week    Comment: no more than 4 whiskey drinks a day; maybe 4 times day   . Drug use: No      Current Outpatient Medications:  .  amLODipine-benazepril (LOTREL) 10-40 MG capsule, TAKE 1 CAPSULE DAILY, Disp: 90 capsule, Rfl: 0 .  calcium citrate-vitamin D (CITRACAL+D) 315-200 MG-UNIT tablet, Take 1 tablet by mouth daily., Disp: , Rfl:  .  carvedilol (COREG) 6.25 MG tablet, TAKE 1 TABLET TWICE A DAY, Disp: 180 tablet, Rfl: 0 .  chlorthalidone (HYGROTON) 25 MG tablet, TAKE 1 TABLET DAILY, Disp: 90 tablet, Rfl: 1 .  Cholecalciferol (VITAMIN D-3) 1000 units CAPS, Take 1 capsule (1,000 Units total) by mouth daily., Disp: , Rfl:  .  Omega-3 Fatty Acids (FISH OIL) 1000 MG CAPS, Take 1,000 mg by mouth daily., Disp: , Rfl:  .  pravastatin (PRAVACHOL) 40 MG tablet, Take 1 tablet (40 mg total) by mouth at bedtime., Disp: 90 tablet, Rfl: 1  Allergies  Allergen Reactions  . Atorvastatin Rash    Chart Review Today: I personally reviewed active problem list, medication list, allergies, family history, social history, health maintenance, notes from last encounter, lab results, imaging with the patient/caregiver today.  Review of Systems  10 Systems reviewed and are negative for acute change except as noted in the HPI.    Objective:    Vitals:   11/16/19 1007  BP: (!) 158/70  Pulse: (!) 102  Resp: 14  Temp: 98.5 F (36.9 C)  SpO2: 95%  Weight: 251 lb 11.2 oz (114.2 kg)  Height: 5\' 7"  (1.702 m)    Body mass index is 39.42 kg/m.  Physical Exam Vitals and nursing note reviewed.  Constitutional:      General: He is not in acute distress.    Appearance: Normal appearance. He is well-developed. He is obese. He is not ill-appearing, toxic-appearing or diaphoretic.     Interventions: Face mask in place.  HENT:     Head: Normocephalic and atraumatic.     Jaw: No trismus.     Right Ear: External ear normal.     Left Ear:  External ear normal.  Eyes:     General: Lids are normal. No scleral icterus.    Conjunctiva/sclera: Conjunctivae normal.     Pupils: Pupils are equal, round, and reactive to light.  Neck:     Trachea: Trachea and phonation normal. No tracheal deviation.  Cardiovascular:     Rate and Rhythm: Normal rate and regular rhythm.     Pulses: Normal pulses.          Radial pulses are 2+ on the right side and 2+ on the left side.       Posterior tibial pulses are 2+ on the right side and 2+ on the left side.     Heart sounds: Normal heart sounds. No  murmur. No friction rub. No gallop.   Pulmonary:     Effort: Pulmonary effort is normal. No respiratory distress.     Breath sounds: Normal breath sounds. No stridor. No wheezing, rhonchi or rales.  Abdominal:     General: Bowel sounds are normal. There is no distension.     Palpations: Abdomen is soft.     Tenderness: There is no abdominal tenderness. There is no guarding or rebound.  Musculoskeletal:        General: Normal range of motion.     Cervical back: Normal range of motion and neck supple.     Right lower leg: No edema.     Left lower leg: No edema.  Skin:    General: Skin is warm and dry.     Capillary Refill: Capillary refill takes less than 2 seconds.     Coloration: Skin is not jaundiced.     Findings: No rash.     Nails: There is no clubbing.  Neurological:     Mental Status: He is alert.     Cranial Nerves: No dysarthria or facial asymmetry.     Motor: No tremor or abnormal muscle tone.     Gait: Gait normal.  Psychiatric:        Mood and Affect: Mood normal.        Speech: Speech normal.        Behavior: Behavior normal. Behavior is cooperative.       PHQ2/9: Depression screen [facility] 2/9 11/16/2019 07/05/2019 06/14/2019 02/09/2019 01/12/2019  Decreased Interest 0 0 0 0 0  Down, Depressed, Hopeless 0 0 0 0 0  PHQ - 2 Score 0 0 0 0 0  Altered sleeping 0 - 0 0 0  Tired, decreased energy 0 - 0 0 0  Change in appetite 0 - 0 0  0  Feeling bad or failure about yourself  0 - 0 0 0  Trouble concentrating 0 - 0 0 0  Moving slowly or fidgety/restless 0 - 0 0 0  Suicidal thoughts 0 - 0 0 0  PHQ-9 Score 0 - 0 0 0  Difficult doing work/chores Not difficult at all - Not difficult at all Not difficult at all Not difficult at all    phq 9 is neg, reviewed   Fall Risk: Fall Risk  11/16/2019 07/05/2019 07/05/2019 06/14/2019 02/09/2019  Falls in the past year? 0 1 1 1  0  Number falls in past yr: 0 1 1 0 -  Injury with Fall? 0 0 0 0 -  Risk for fall due to : - History of fall(s) - - -  Follow up - Falls prevention discussed Falls prevention discussed - -    Functional Status Survey: Is the patient deaf or have difficulty hearing?: No Does the patient have difficulty seeing, even when wearing glasses/contacts?: No Does the patient have difficulty concentrating, remembering, or making decisions?: No Does the patient have difficulty walking or climbing stairs?: No Does the patient have difficulty dressing or bathing?: No Does the patient have difficulty doing errands alone such as visiting a doctor's office or shopping?: No   Assessment & Plan:   1. Chronic obstructive pulmonary disease, unspecified COPD type ([place]) Previously assessed for COPD, pt states he was told he does not have  2. Essential hypertension High BP today, pt states he monitors at home and BP is normal and he is nervous in Dr. Paulene Floor, high BP and tachycardic, continue to monitor at home with goal of <  140/90 Last labs reviewed, good renal function  3. Hypercholesterolemia Well controlled, stable with current meds, no myalgias, CP, claudication, encouraged to work on healthier diet and exercise refill on meds  4. Prediabetes A1C gradaully increasing with some associated weight going due to [place] pandemic and decreased activity - advised to work on healthy diet, decrease carbs increase activity handout given  5. Class 2 severe obesity with serious  comorbidity and body mass index (BMI) of 39.0 to 39.9 in adult, unspecified obesity type ([place]) Unfortunately weight increasing - encouraged to work on diet and exercise  6. Prostate cancer [facility]) Per urology   Return for 4 month f/up in office for HTN/HLD/preDM/BMI.   Delsa Grana, PA-C 11/16/19 10:19 AM

## 2019-11-16 NOTE — Patient Instructions (Signed)
Follow up in 4 months to recheck everything in office - especially weight/BMI, blood pressure and labs  Work on healthy diet and increasing physical activity  Any small amount of weight loss, even 5 to 10 lbs can improve health, decrease blood pressure and improve cholesterol levels.    Your prediabetes is gradually getting worse, so I would like to see you again soon and discuss medications that can help with metabolism, prediabetes and help aid in weight loss.      American Heart Association (AHA) Exercise Recommendation  Being physically active is important to prevent heart disease and stroke, the nation's No. 1and No. 5killers. To improve overall cardiovascular health, we suggest at least 150 minutes per week of moderate exercise or 75 minutes per week of vigorous exercise (or a combination of moderate and vigorous activity). Thirty minutes a day, five times a week is an easy goal to remember. You will also experience benefits even if you divide your time into two or three segments of 10 to 15 minutes per day.  For people who would benefit from lowering their blood pressure or cholesterol, we recommend 40 minutes of aerobic exercise of moderate to vigorous intensity three to four times a week to lower the risk for heart attack and stroke.  Physical activity is anything that makes you move your body and burn calories.  This includes things like climbing stairs or playing sports. Aerobic exercises benefit your heart, and include walking, jogging, swimming or biking. Strength and stretching exercises are best for overall stamina and flexibility.  The simplest, positive change you can make to effectively improve your heart health is to start walking. It's enjoyable, free, easy, social and great exercise. A walking program is flexible and boasts high success rates because people can stick with it. It's easy for walking to become a regular and satisfying part of life.   For Overall Cardiovascular  Health:  At least 30 minutes of moderate-intensity aerobic activity at least 5 days per week for a total of 150  OR   At least 25 minutes of vigorous aerobic activity at least 3 days per week for a total of 75 minutes; or a combination of moderate- and vigorous-intensity aerobic activity  AND   Moderate- to high-intensity muscle-strengthening activity at least 2 days per week for additional health benefits.  For Lowering Blood Pressure and Cholesterol  An average 40 minutes of moderate- to vigorous-intensity aerobic activity 3 or 4 times per week  What if I can't make it to the time goal? Something is always better than nothing! And everyone has to start somewhere. Even if you've been sedentary for years, today is the day you can begin to make healthy changes in your life. If you don't think you'll make it for 30 or 40 minutes, set a reachable goal for today. You can work up toward your overall goal by increasing your time as you get stronger. Don't let all-or-nothing thinking rob you of doing what you can every day.  Source:http://www.heart.org

## 2019-12-09 DIAGNOSIS — G4733 Obstructive sleep apnea (adult) (pediatric): Secondary | ICD-10-CM | POA: Diagnosis not present

## 2019-12-25 ENCOUNTER — Other Ambulatory Visit: Payer: Self-pay | Admitting: Family Medicine

## 2019-12-26 NOTE — Telephone Encounter (Signed)
Requested Prescriptions  Pending Prescriptions Disp Refills  . carvedilol (COREG) 6.25 MG tablet [Pharmacy Med Name: CARVEDILOL TAB 6.25MG ] 180 tablet 0    Sig: TAKE 1 TABLET TWICE A DAY     Cardiovascular:  Beta Blockers Failed - 12/25/2019 11:14 PM      Failed - Last BP in normal range    BP Readings from Last 1 Encounters:  11/16/19 (!) 152/70         Passed - Last Heart Rate in normal range    Pulse Readings from Last 1 Encounters:  11/16/19 93         Passed - Valid encounter within last 6 months    Recent Outpatient Visits          1 month ago Chronic obstructive pulmonary disease, unspecified COPD type [facility])   [facility] Delsa Grana, PA-C   6 months ago Essential hypertension   [facility] [place], Kristeen Miss, PA-C   10 months ago Hypertensive nephrosclerosis, stage 1 through stage 4 or unspecified chronic kidney disease   [place], NP   11 months ago Acute left ankle pain   [place], NP   1 year ago Hypertensive nephrosclerosis, stage 1 through stage 4 or unspecified chronic kidney disease   [place], Satira Anis, MD      Future Appointments            In 3 weeks Di[facility]ant[facility] [place], Herbert Seta, MD [place]   In 2 months Delsa Grana, PA-C [facility], [place]   In 6 months  [facility], [facility]

## 2020-01-09 ENCOUNTER — Other Ambulatory Visit: Payer: Medicare HMO

## 2020-01-09 ENCOUNTER — Encounter: Payer: Self-pay | Admitting: Emergency Medicine

## 2020-01-09 ENCOUNTER — Other Ambulatory Visit: Payer: Self-pay

## 2020-01-09 ENCOUNTER — Ambulatory Visit
Admission: EM | Admit: 2020-01-09 | Discharge: 2020-01-09 | Disposition: A | Discharge status: Home / Self Care | Payer: Medicare HMO | Attending: Family Medicine | Admitting: Family Medicine

## 2020-01-09 DIAGNOSIS — M79671 Pain in right foot: Secondary | ICD-10-CM | POA: Diagnosis not present

## 2020-01-09 MED ORDER — TRAMADOL HCL 50 MG PO TABS: 50.0000 mg | ORAL_TABLET | Freq: Three times a day (TID) | ORAL | 0 refills | Status: DC | PRN

## 2020-01-09 NOTE — ED Triage Notes (Signed)
Pt c/o right foot pain. He states the pain is located on the bottom of his foot at the arch of his foot. Started about 5 days ago. He has tried tylenol and epsom salt soaks.

## 2020-01-09 NOTE — ED Provider Notes (Signed)
[facility]    CSN: [id number] Arrival date & time: 01/09/20  1159  History   Chief Complaint Chief Complaint  Patient presents with  . Foot Pain    right   HPI  69 year old male presents with foot pain.  Patient reports a 5-day history of right foot pain.  No fall, trauma, injury.  Patient reports that the pain is on the plantar aspect of his right foot.  He states that he is unable to further localize the pain.  He states it is worse when he gets up and also worse first thing in the morning.  He has tried Tylenol and some Epson salt soaks without resolution.  He rates his pain is 8/10 in severity.  Described as aching.  No relieving factors.  No other complaints.  Past Medical History:  Diagnosis Date  . Abnormal weight gain 03/13/2016  . COPD (chronic obstructive pulmonary disease) ([place])   . Hx of malignant neoplasm of prostate 05/31/2012  . Hx of tobacco use, presenting hazards to health 04/17/2016  . Hypercholesterolemia 03/13/2016  . Hyperlipidemia   . Hypertension   . Hypertensive nephrosclerosis 01/27/2018  . Prostate cancer ([place])   . Sleep apnea    uses CPAP    Patient Active Problem List   Diagnosis Date Noted  . Morbid obesity ([place]) 11/02/2018  . History of colonic polyps   . Benign neoplasm of ascending colon   . Benign neoplasm of cecum   . Hypertensive nephrosclerosis 01/27/2018  . Prediabetes 06/30/2017  . Screening for AAA (abdominal aortic aneurysm) 06/30/2017  . Welcome to Medicare preventive visit 06/30/2017  . COPD (chronic obstructive pulmonary disease) ([place]) 04/15/2017  . Hx of tobacco use, presenting hazards to health 04/17/2016  . Abnormal weight gain 03/13/2016  . Hypercholesterolemia 03/13/2016  . Medication monitoring encounter 03/13/2016  . Chronic kidney disease, stage III (moderate) 07/10/2015  . Annual physical exam 04/10/2015  . Genuine stress incontinence, male 06/23/2012  . Cystitis, radiation 05/31/2012  . Hematuria,  microscopic 05/31/2012  . ED (erectile dysfunction) of organic origin 05/31/2012  . Hx of malignant neoplasm of prostate 05/31/2012  . Microscopic hematuria 05/31/2012    Past Surgical History:  Procedure Laterality Date  . COLONOSCOPY WITH PROPOFOL N/A 07/29/2018   Procedure: COLONOSCOPY WITH BIOPSIES;  Surgeon: Lucilla Lame, MD;  Location: [place];  Service: Endoscopy;  Laterality: N/A;  . POLYPECTOMY N/A 07/29/2018   Procedure: POLYPECTOMY;  Surgeon: Lucilla Lame, MD;  Location: [place];  Service: Endoscopy;  Laterality: N/A;  . PROSTATE SURGERY     Home Medications    Prior to Admission medications   Medication Sig Start Date End Date Taking? Authorizing Provider  amLODipine-benazepril (LOTREL) 10-40 MG capsule TAKE 1 CAPSULE DAILY 10/24/19  Yes Delsa Grana, PA-C  calcium citrate-vitamin D (CITRACAL+D) 315-200 MG-UNIT tablet Take 1 tablet by mouth daily.   Yes [provider]  carvedilol (COREG) 6.25 MG tablet TAKE 1 TABLET TWICE A DAY 12/26/19  Yes Hubbard [place], FNP  chlorthalidone (HYGROTON) 25 MG tablet TAKE 1 TABLET DAILY 10/07/19  Yes Delsa Grana, PA-C  Cholecalciferol (VITAMIN D-3) 1000 units CAPS Take 1 capsule (1,000 Units total) by mouth daily. 04/15/17  Yes [place], Satira Anis, MD  Omega-3 Fatty Acids (FISH OIL) 1000 MG CAPS Take 1,000 mg by mouth daily.   Yes [provider]  pravastatin (PRAVACHOL) 40 MG tablet Take 1 tablet (40 mg total) by mouth at bedtime. 01/03/20  Yes Delsa Grana, PA-C  traMADol (ULTRAM) 50 MG tablet Take 1 tablet (50 mg total) by mouth every 8 (eight) hours as needed. 01/09/20   Coral Spikes, DO   Family History Family History  Problem Relation Age of Onset  . Hypertension Mother   . Aneurysm Mother 40       brain  . Hypertension Father   . Heart attack Father   . Hypertension Sister   . Hypertension Brother   . Heart failure Brother   . Heart disease Paternal Grandfather   . Hypertension Sister   .  Hypertension Brother   . Cancer Brother        throat cancer  . Kidney cancer Neg Hx   . Kidney failure Neg Hx   . Prostate cancer Neg Hx   . Sickle cell anemia Neg Hx   . Tuberculosis Neg Hx    Social History Social History   Tobacco Use  . Smoking status: Former Smoker    Packs/day: 1.00    Years: 40.00    Pack years: 40.00    Quit date: 11/2013    Years since quitting: 6.1  . Smokeless tobacco: Never Used  Substance Use Topics  . Alcohol use: Yes    Alcohol/week: 16.0 standard drinks    Types: 16 Shots of liquor per week    Comment: no more than 4 whiskey drinks a day; maybe 4 times day   . Drug use: No    Allergies   Atorvastatin   Review of Systems Review of Systems  Constitutional: Negative.   Musculoskeletal:       Foot pain.   Physical Exam Triage Vital Signs ED Triage Vitals  Enc Vitals Group     BP 01/09/20 1220 (!) 155/74     Pulse Rate 01/09/20 1220 95     Resp 01/09/20 1220 18     Temp 01/09/20 1220 98.4 F (36.9 C)     Temp Source 01/09/20 1220 Oral     SpO2 01/09/20 1220 97 %     Weight 01/09/20 1217 251 lb 12.3 oz (114.2 kg)     Height 01/09/20 1217 5\' 7"  (1.702 m)     Head Circumference --      Peak Flow --      Pain Score 01/09/20 1217 8     Pain Loc --      Pain Edu? --      Excl. in [place]? --    Updated Vital Signs BP (!) 155/74 (BP Location: Left Arm)   Pulse 95   Temp 98.4 F (36.9 C) (Oral)   Resp 18   Ht 5\' 7"  (1.702 m)   Wt 114.2 kg   SpO2 97%   BMI 39.43 kg/m   Visual Acuity Right Eye Distance:   Left Eye Distance:   Bilateral Distance:    Right Eye Near:   Left Eye Near:    Bilateral Near:     Physical Exam Vitals and nursing note reviewed.  Constitutional:      General: He is not in acute distress.    Appearance: Normal appearance. He is obese. He is not ill-appearing.  HENT:     Head: Normocephalic and atraumatic.  Eyes:     General:        Right eye: No discharge.        Left eye: No discharge.      Conjunctiva/sclera: Conjunctivae normal.  Cardiovascular:     Rate and Rhythm: Normal rate and regular rhythm.  Pulmonary:     Effort: Pulmonary effort is normal. No respiratory distress.  Feet:     Comments: No discrete areas of tenderness on the plantar aspect of the foot.   Neurological:     Mental Status: He is alert.  Psychiatric:        Mood and Affect: Mood normal.        Behavior: Behavior normal.     UC Treatments / Results  Labs (all labs ordered are listed, but only abnormal results are displayed) Labs Reviewed - No data to display  EKG   Radiology No results found.  Procedures Procedures (including critical care time)  Medications Ordered in UC Medications - No data to display  Initial Impression / Assessment and Plan / UC Course  I have reviewed the triage vital signs and the nursing notes.  Pertinent labs & imaging results that were available during my care of the patient were reviewed by me and considered in my medical decision making (see chart for details).    69 year old male presents with right foot pain.  Patient is describing a typical presentation of plantar fasciitis.  However, he is not tender on exam.  Tramadol as needed.  Rest, ice, elevation.  Heel cups.  Advised to see podiatry if persist.  Final Clinical Impressions(s) / UC Diagnoses   Final diagnoses:  Right foot pain     Discharge Instructions     Heel cups in shoes.  Rest, ice, elevate.  Medication as directed.  See Podiatry.  Take care  Dr. Lacinda Axon    ED Prescriptions    Medication Sig Dispense Auth. Provider   traMADol (ULTRAM) 50 MG tablet Take 1 tablet (50 mg total) by mouth every 8 (eight) hours as needed. 10 tablet Thersa Salt G, DO     I have reviewed the PDMP during this encounter.   Coral Spikes, [place] 01/09/20 1442

## 2020-01-09 NOTE — Discharge Instructions (Signed)
Heel cups in shoes.  Rest, ice, elevate.  Medication as directed.  See Podiatry.  Take care  Dr. Lacinda Axon

## 2020-01-10 DIAGNOSIS — G4733 Obstructive sleep apnea (adult) (pediatric): Secondary | ICD-10-CM | POA: Diagnosis not present

## 2020-01-12 ENCOUNTER — Encounter: Payer: Self-pay | Admitting: Podiatry

## 2020-01-12 ENCOUNTER — Ambulatory Visit: Payer: Medicare HMO | Admitting: Podiatry

## 2020-01-12 ENCOUNTER — Other Ambulatory Visit: Payer: Self-pay

## 2020-01-12 ENCOUNTER — Ambulatory Visit: Payer: Medicare HMO

## 2020-01-12 ENCOUNTER — Other Ambulatory Visit: Payer: Medicare HMO

## 2020-01-12 VITALS — Temp 98.1°F

## 2020-01-12 DIAGNOSIS — M7741 Metatarsalgia, right foot: Secondary | ICD-10-CM

## 2020-01-12 DIAGNOSIS — M778 Other enthesopathies, not elsewhere classified: Secondary | ICD-10-CM | POA: Diagnosis not present

## 2020-01-12 DIAGNOSIS — M7742 Metatarsalgia, left foot: Secondary | ICD-10-CM | POA: Diagnosis not present

## 2020-01-13 ENCOUNTER — Encounter: Payer: Self-pay | Admitting: Podiatry

## 2020-01-13 NOTE — Progress Notes (Signed)
Subjective:  Patient ID: Todd Hutchinson, male    DOB: January 04, 1951,  MRN: [id number]  Chief Complaint  Patient presents with  . Foot Pain    69 y.o. male presents with the above complaint.  Patient presents with complaint of pain to the right dorsal forefoot as well as bilateral plantar forefoot.  Patient states that this has been going on for quite some time especially over the last week the right side is worse than left side.  Is worse in the morning.  Patient states that there is burning pain associated with it.  Is worse when standing on the foot.  The most of the pain is located to the forefoot.  He does not have any heel pain.  Patient has tried ice pack Epson salt water Tylenol and elevating the foot which has helped some but not made the pain go away completely.  He would like to discuss if there is anything else is going on.  He has not seen anyone else prior to seeing me.   Review of Systems: Negative except as noted in the HPI. Denies N/V/F/Ch.  Past Medical History:  Diagnosis Date  . Abnormal weight gain 03/13/2016  . COPD (chronic obstructive pulmonary disease) ([place])   . Hx of malignant neoplasm of prostate 05/31/2012  . Hx of tobacco use, presenting hazards to health 04/17/2016  . Hypercholesterolemia 03/13/2016  . Hyperlipidemia   . Hypertension   . Hypertensive nephrosclerosis 01/27/2018  . Prostate cancer ([place])   . Sleep apnea    uses CPAP    Current Outpatient Medications:  .  amLODipine-benazepril (LOTREL) 10-40 MG capsule, TAKE 1 CAPSULE DAILY, Disp: 90 capsule, Rfl: 0 .  calcium citrate-vitamin D (CITRACAL+D) 315-200 MG-UNIT tablet, Take 1 tablet by mouth daily., Disp: , Rfl:  .  carvedilol (COREG) 6.25 MG tablet, TAKE 1 TABLET TWICE A DAY, Disp: 180 tablet, Rfl: 0 .  chlorthalidone (HYGROTON) 25 MG tablet, TAKE 1 TABLET DAILY, Disp: 90 tablet, Rfl: 1 .  Cholecalciferol (VITAMIN D-3) 1000 units CAPS, Take 1 capsule (1,000 Units total) by mouth daily., Disp: ,  Rfl:  .  Omega-3 Fatty Acids (FISH OIL) 1000 MG CAPS, Take 1,000 mg by mouth daily., Disp: , Rfl:  .  pravastatin (PRAVACHOL) 40 MG tablet, Take 1 tablet (40 mg total) by mouth at bedtime., Disp: 90 tablet, Rfl: 3 .  traMADol (ULTRAM) 50 MG tablet, Take 1 tablet (50 mg total) by mouth every 8 (eight) hours as needed., Disp: 10 tablet, Rfl: 0  Social History   Tobacco Use  Smoking Status Former Smoker  . Packs/day: 1.00  . Years: 40.00  . Pack years: 40.00  . Quit date: 11/2013  . Years since quitting: 6.1  Smokeless Tobacco Never Used    Allergies  Allergen Reactions  . Atorvastatin Rash   Objective:   Vitals:   01/12/20 1611  Temp: 98.1 F (36.7 C)   There is no height or weight on file to calculate BMI. Constitutional Well developed. Well nourished.  Vascular Dorsalis pedis pulses palpable bilaterally. Posterior tibial pulses palpable bilaterally. Capillary refill normal to all digits.  No cyanosis or clubbing noted. Pedal hair growth normal.  Neurologic Normal speech. Oriented to person, place, and time. Epicritic sensation to light touch grossly present bilaterally.  Dermatologic Nails well groomed and normal in appearance. No open wounds. No skin lesions.  Orthopedic:  Pain on palpation to the right dorsal midfoot.  Mild pain with resisted plantarflexion as well as dorsiflexion of  the digits 3 and 4.  No pain on the palpation along the shaft of the metatarsal.  No pain at the metatarsophalangeal joint of each of the respective digit.  Generalized pain across the ball of the foot on the plantar aspect of the foot bilaterally.  Negative Mulder's click in each of the interspaces.   Radiographs: None Assessment:   1. Extensor tendinitis of foot   2. Metatarsalgia of both feet    Plan:  Patient was evaluated and treated and all questions answered.  Right dorsal extensor tendinitis -I explained the patient the etiology of extensor tendinitis and various treatment  options were discussed.  I believe patient will benefit from a steroid injection to help decrease the inflammatory component associated with the tendinitis.  Patient agrees with the plan would like to proceed with the steroid injection. -A steroid injection was performed at right dorsal forefoot at the point of maximal tenderness using 1% plain Lidocaine and 10 mg of Kenalog. This was well tolerated.  Bilateral metatarsalgia both feet -I explained the patient the etiology of metatarsalgia and various treatment options were extensively discussed.  Given that patient may be walking more in the forefoot it seems to be causing generalized pain along the heads of each of them metatarsophalangeal joints.  Patient does not have Mulder's click or any pain in the interspaces likely ruling out neuromas. -I believe he will benefit from metatarsal pad to help help offload the forefoot.  Also discussed with him shoe gear modification and obtain new balance, [employer], [employer]. -Metatarsal pads were dispensed   No follow-ups on file.

## 2020-01-16 ENCOUNTER — Telehealth: Payer: Medicare HMO | Admitting: Urology

## 2020-01-17 ENCOUNTER — Other Ambulatory Visit: Payer: Medicare HMO

## 2020-01-17 ENCOUNTER — Other Ambulatory Visit: Payer: Self-pay

## 2020-01-17 DIAGNOSIS — Z8546 Personal history of malignant neoplasm of prostate: Secondary | ICD-10-CM | POA: Diagnosis not present

## 2020-01-18 ENCOUNTER — Telehealth (INDEPENDENT_AMBULATORY_CARE_PROVIDER_SITE_OTHER): Payer: Medicare HMO | Admitting: Urology

## 2020-01-18 DIAGNOSIS — C61 Malignant neoplasm of prostate: Secondary | ICD-10-CM | POA: Diagnosis not present

## 2020-01-18 LAB — PSA: Prostate Specific Ag, Serum: 0.2 ng/mL (ref 0.0–4.0)

## 2020-01-18 NOTE — Progress Notes (Signed)
Virtual Visit via Telephone Note  I connected with Todd Hutchinson on 01/18/20 at 11:15 AM EDT by telephone and verified that I am speaking with the correct person using two identifiers.   I discussed the limitations, risks, security and privacy concerns of performing an evaluation and management service by telephone and the availability of in person appointments. We discussed the impact of the COVID-19 pandemic on the healthcare system, and the importance of social distancing and reducing patient and provider exposure. I also discussed with the patient that there may be a patient responsible charge related to this service. The patient expressed understanding and agreed to proceed.  Reason for visit: Prostate cancer  History of Present Illness: Briefly, Todd Hutchinson a 69 year old African-American male previously followed by Dr. Jacqlyn Larsen with history of open radical prostatectomy in 2000with final pathology showing Gleason score 3+4=7 with negative margins and no extraprostatic extension, salvage radiation in 2013for reported biochemical recurrence, and long-term Lupron androgen deprivation therapy. His PSA trend since 2014 has remained essentially undetectableon Lupron from 0-0.2.  At our visit in January 2020 his PSA remained stable at 0.2 from 0.2 after being off of Lupron for 9 months when he transitioned from Dr. Jacqlyn Larsen to our practice at [facility].  We discussed the risks and benefits of stopping Lupron and continuing to monitor the PSA, and he agreed to stop Lupron.  His repeat PSA on 07/12/2019 was stable at 0.2 from 0.2 previously.  He overall is doing well and denies any complaints of weight loss or bone pain.  PSA remains stable at 0.2 from 0.2 six months ago.  We again reviewed the [facility] guidelines regarding prostate cancer and treatment options in the future if PSA were to rise.  I again discouraged him from resuming ADT with his history and very low PSA that has been stable for over  a year off of hormones.  Follow Up: RTC 1 year with PSA prior  I discussed the assessment and treatment plan with the patient. The patient was provided an opportunity to ask questions and all were answered. The patient agreed with the plan and demonstrated an understanding of the instructions.   The patient was advised to call back or seek an in-person evaluation if the symptoms worsen or if the condition fails to improve as anticipated.  I provided 11 minutes of non-face-to-face time during this encounter.   Billey Co, MD

## 2020-01-19 DIAGNOSIS — N183 Chronic kidney disease, stage 3 unspecified: Secondary | ICD-10-CM | POA: Diagnosis not present

## 2020-01-19 DIAGNOSIS — I10 Essential (primary) hypertension: Secondary | ICD-10-CM | POA: Diagnosis not present

## 2020-01-20 ENCOUNTER — Telehealth: Payer: Self-pay | Admitting: Urology

## 2020-01-20 NOTE — Telephone Encounter (Signed)
-----   Message from Billey Co, MD sent at 01/18/2020 11:13 AM EDT ----- Regarding: follow up RTC one year with PSA prior, thanks  Nickolas Madrid, MD 01/18/2020

## 2020-01-20 NOTE — Telephone Encounter (Signed)
Done

## 2020-01-25 DIAGNOSIS — I10 Essential (primary) hypertension: Secondary | ICD-10-CM | POA: Diagnosis not present

## 2020-01-25 DIAGNOSIS — N1831 Chronic kidney disease, stage 3a: Secondary | ICD-10-CM | POA: Diagnosis not present

## 2020-01-25 DIAGNOSIS — R319 Hematuria, unspecified: Secondary | ICD-10-CM | POA: Diagnosis not present

## 2020-01-28 ENCOUNTER — Other Ambulatory Visit: Payer: Self-pay | Admitting: Family Medicine

## 2020-01-28 DIAGNOSIS — I10 Essential (primary) hypertension: Secondary | ICD-10-CM

## 2020-02-08 DIAGNOSIS — G4733 Obstructive sleep apnea (adult) (pediatric): Secondary | ICD-10-CM | POA: Diagnosis not present

## 2020-02-23 ENCOUNTER — Other Ambulatory Visit: Payer: Self-pay

## 2020-02-23 ENCOUNTER — Ambulatory Visit: Payer: Medicare HMO | Admitting: Podiatry

## 2020-02-23 DIAGNOSIS — M7741 Metatarsalgia, right foot: Secondary | ICD-10-CM | POA: Diagnosis not present

## 2020-02-23 DIAGNOSIS — M7742 Metatarsalgia, left foot: Secondary | ICD-10-CM | POA: Diagnosis not present

## 2020-02-23 DIAGNOSIS — M778 Other enthesopathies, not elsewhere classified: Secondary | ICD-10-CM | POA: Diagnosis not present

## 2020-02-24 ENCOUNTER — Encounter: Payer: Self-pay | Admitting: Podiatry

## 2020-02-24 NOTE — Progress Notes (Signed)
Subjective:  Patient ID: Todd Hutchinson, male    DOB: Jul 12, 1951,  MRN: [id number]  Chief Complaint  Patient presents with  . Foot Pain    pt is here for bil foot pain, pt states that both of her foot pain is elevated to the touch.     69 y.o. male presents with the above complaint.  Patient is following up of right dorsal extensor tendinitis as well as metatarsalgia pain.  Patient states the pain has completely resolved.  The steroid injection helped considerably.  The metatarsal pad has also helped a lot.  He denies any other acute complaints.  He has also made shoe gear modification as well.   Review of Systems: Negative except as noted in the HPI. Denies N/V/F/Ch.  Past Medical History:  Diagnosis Date  . Abnormal weight gain 03/13/2016  . COPD (chronic obstructive pulmonary disease) ([place])   . Hx of malignant neoplasm of prostate 05/31/2012  . Hx of tobacco use, presenting hazards to health 04/17/2016  . Hypercholesterolemia 03/13/2016  . Hyperlipidemia   . Hypertension   . Hypertensive nephrosclerosis 01/27/2018  . Prostate cancer ([place])   . Sleep apnea    uses CPAP    Current Outpatient Medications:  .  amLODipine-benazepril (LOTREL) 10-40 MG capsule, TAKE 1 CAPSULE DAILY., Disp: 90 capsule, Rfl: 3 .  calcium citrate-vitamin D (CITRACAL+D) 315-200 MG-UNIT tablet, Take 1 tablet by mouth daily., Disp: , Rfl:  .  carvedilol (COREG) 6.25 MG tablet, TAKE 1 TABLET TWICE A DAY, Disp: 180 tablet, Rfl: 0 .  chlorthalidone (HYGROTON) 25 MG tablet, TAKE 1 TABLET DAILY, Disp: 90 tablet, Rfl: 1 .  Cholecalciferol (VITAMIN D-3) 1000 units CAPS, Take 1 capsule (1,000 Units total) by mouth daily., Disp: , Rfl:  .  Omega-3 Fatty Acids (FISH OIL) 1000 MG CAPS, Take 1,000 mg by mouth daily., Disp: , Rfl:  .  pravastatin (PRAVACHOL) 40 MG tablet, Take 1 tablet (40 mg total) by mouth at bedtime., Disp: 90 tablet, Rfl: 3 .  traMADol (ULTRAM) 50 MG tablet, Take 1 tablet (50 mg total) by mouth  every 8 (eight) hours as needed., Disp: 10 tablet, Rfl: 0  Social History   Tobacco Use  Smoking Status Former Smoker  . Packs/day: 1.00  . Years: 40.00  . Pack years: 40.00  . Quit date: 11/2013  . Years since quitting: 6.2  Smokeless Tobacco Never Used    Allergies  Allergen Reactions  . Atorvastatin Rash   Objective:   There were no vitals filed for this visit. There is no height or weight on file to calculate BMI. Constitutional Well developed. Well nourished.  Vascular Dorsalis pedis pulses palpable bilaterally. Posterior tibial pulses palpable bilaterally. Capillary refill normal to all digits.  No cyanosis or clubbing noted. Pedal hair growth normal.  Neurologic Normal speech. Oriented to person, place, and time. Epicritic sensation to light touch grossly present bilaterally.  Dermatologic Nails well groomed and normal in appearance. No open wounds. No skin lesions.  Orthopedic:  No pain on palpation to the right dorsal midfoot.  Mild pain with resisted plantarflexion as well as dorsiflexion of the digits 3 and 4.  No pain on the palpation along the shaft of the metatarsal.  No pain at the metatarsophalangeal joint of each of the respective digit.  No generalized pain across the ball of the foot on the plantar aspect of the foot bilaterally.  Negative Mulder's click in each of the interspaces.   Radiographs: None Assessment:  No diagnosis found. Plan:  Patient was evaluated and treated and all questions answered.  Right dorsal extensor tendinitis -Completely resolved with 1 course of steroid injection.  I discussed with the patient the importance of maintaining and keeping the pain away.  Patient states understanding.  Shoe gear modifications were made as well.  Bilateral metatarsalgia both feet -Completely resolved with metatarsal pads. -Shoe gear modifications were made. -I discussed with the patient the etiology of metatarsalgia and how to prevent it from  coming back.  Continue wearing the metatarsal pads.  If there is no improvement in the future we will discuss orthotics management as well.   No follow-ups on file.

## 2020-02-29 ENCOUNTER — Telehealth: Payer: Self-pay | Admitting: Family Medicine

## 2020-02-29 DIAGNOSIS — R69 Illness, unspecified: Secondary | ICD-10-CM | POA: Diagnosis not present

## 2020-02-29 NOTE — Chronic Care Management (AMB) (Signed)
  Chronic Care Management   Note  02/29/2020 Name: Todd Hutchinson MRN: [id number] DOB: 06-21-1951  Todd Hutchinson is a 69 y.o. year old male who is a primary care patient of Delsa Grana, [place]. I reached out to Todd Hutchinson by phone today in response to a referral sent by Todd Hutchinson's health plan.     [name] was given information about Chronic Care Management services today including:  1. CCM service includes personalized support from designated clinical staff supervised by his physician, including individualized plan of care and coordination with other care providers 2. 24/7 contact phone numbers for assistance for urgent and routine care needs. 3. Service will only be billed when office clinical staff spend 20 minutes or more in a month to coordinate care. 4. Only one practitioner may furnish and bill the service in a calendar month. 5. The patient may stop CCM services at any time (effective at the end of the month) by phone call to the office staff. 6. The patient will be responsible for cost sharing (co-pay) of up to 20% of the service fee (after annual deductible is met).  Patient agreed to services and verbal consent obtained.   Follow up plan: Telephone appointment with care management team member scheduled for: 04/02/2020  [facility]  [place], [place] [address] Direct Dial: [email or web address] Website: [email or web address]

## 2020-02-29 NOTE — Chronic Care Management (AMB) (Signed)
  Chronic Care Management   Outreach Note  02/29/2020 Name: Todd Hutchinson MRN: [id number] DOB: 1951-05-11  Todd Hutchinson is a 69 y.o. year old male who is a primary care patient of Delsa Grana, [place]. I reached out to [name] by phone today in response to a referral sent by [name]'s health plan.     An unsuccessful telephone outreach was attempted today. The patient was referred to the case management team for assistance with care management and care coordination.   Follow Up Plan: A HIPPA compliant phone message was left for the patient providing contact information and requesting a return call. The care management team will reach out to the patient again over the next 7 days. If patient returns call to provider office, please advise to call [place] at [phone].  [place], [place] [address] Direct Dial: [phone] [email or web address] Website: [email or web address]

## 2020-03-05 ENCOUNTER — Other Ambulatory Visit: Payer: Self-pay | Admitting: Family Medicine

## 2020-03-06 ENCOUNTER — Other Ambulatory Visit: Payer: Self-pay

## 2020-03-06 MED ORDER — CARVEDILOL 6.25 MG PO TABS: 6.2500 mg | ORAL_TABLET | Freq: Two times a day (BID) | ORAL | 3 refills | Status: DC

## 2020-03-09 DIAGNOSIS — G4733 Obstructive sleep apnea (adult) (pediatric): Secondary | ICD-10-CM | POA: Diagnosis not present

## 2020-03-20 ENCOUNTER — Encounter: Payer: Self-pay | Admitting: Family Medicine

## 2020-03-20 ENCOUNTER — Other Ambulatory Visit: Payer: Self-pay

## 2020-03-20 ENCOUNTER — Ambulatory Visit (INDEPENDENT_AMBULATORY_CARE_PROVIDER_SITE_OTHER): Payer: Medicare HMO | Admitting: Family Medicine

## 2020-03-20 VITALS — BP 134/78 | HR 88 | Temp 97.3°F | Resp 18 | Ht 67.0 in | Wt 243.7 lb

## 2020-03-20 DIAGNOSIS — I10 Essential (primary) hypertension: Secondary | ICD-10-CM

## 2020-03-20 DIAGNOSIS — Z6838 Body mass index (BMI) 38.0-38.9, adult: Secondary | ICD-10-CM | POA: Diagnosis not present

## 2020-03-20 DIAGNOSIS — E785 Hyperlipidemia, unspecified: Secondary | ICD-10-CM | POA: Diagnosis not present

## 2020-03-20 DIAGNOSIS — R7303 Prediabetes: Secondary | ICD-10-CM | POA: Diagnosis not present

## 2020-03-20 NOTE — Patient Instructions (Addendum)
Health Maintenance  Topic Date Due  . COVID-19 Vaccine (1) Never done  . TETANUS/TDAP  11/15/2020 (Originally [birth year]/08/1970)  . INFLUENZA VACCINE  04/15/2020  . PNA vac Low Risk Adult (2 of 2 - PPSV23) 06/17/2020  . COLONOSCOPY  07/30/2023  . Hepatitis C Screening  Completed     Keep working on healthy diet and adding exercise so you can keep loosing weight.  I am good with your blood pressure being between 110/60 to 140/80 - let me know if your blood pressure is too high or too low.  And please come in sooner for use to recheck you    Let me know if you want to talk to the dietician or if you're interested in weight loss/metabolic dysfunction meds

## 2020-03-20 NOTE — Progress Notes (Signed)
Name: Todd Hutchinson   MRN: [id number]    DOB: [birth year]   Date:03/20/2020       Progress Note  Chief Complaint  Patient presents with  . Hyperlipidemia    4 month follow up  . Hypertension     Subjective:   Todd Hutchinson is a 69 y.o. male, presents to clinic for routine f/up  Hyperlipidemia: Currently treated with pravastatin and fish oil supplement OTC, pt reports good med compliance Last Lipids: Lab Results  Component Value Date   CHOL 177 11/10/2019   HDL 59 11/10/2019   LDLCALC 96 11/10/2019   TRIG 123 11/10/2019   CHOLHDL 3.0 11/10/2019   - Denies: Chest pain, shortness of breath, myalgias, claudication  Prediabetes - last A1C 5.9 - has been around this for years Lab Results  Component Value Date   HGBA1C 5.9 (H) 11/10/2019   HGBA1C 5.8 (H) 02/03/2019   HGBA1C 5.9 (H) 07/02/2018   Hypertension:  Currently managed on chlorthalidone, amlodipine, benazepril and coreg  Pt reports good med compliance and denies any SE.  No lightheadedness, hypotension, syncope. Blood pressure today is well controlled. BP Readings from Last 3 Encounters:  03/20/20 134/78  01/09/20 (!) 155/74  11/16/19 (!) 152/70   Pt denies CP, SOB, exertional sx, LE edema, palpitation, Ha's, visual disturbances   Obesity - pt has lost about 8 lbs since last OV Wt Readings from Last 5 Encounters:  03/20/20 243 lb 11.2 oz (110.5 kg)  01/09/20 251 lb 12.3 oz (114.2 kg)  11/16/19 251 lb 11.2 oz (114.2 kg)  07/05/19 243 lb (110.2 kg)  06/14/19 242 lb (109.8 kg)   BMI Readings from Last 5 Encounters:  03/20/20 38.17 kg/m  01/09/20 39.43 kg/m  11/16/19 39.42 kg/m  07/05/19 38.06 kg/m  06/14/19 37.90 kg/m   CKD stage 3a - sees Dr. Juleen China  Reviewed most recent f/up OV 01/25/2020: 1. Chronic Kidney Disease stage IIIA: Secondary to hypertensive nephrosclerosis. Creatinine now at baseline for last few readings.  - Continue benazepril for renal protection. - Patient to  follow up as needed  2. Hypertension: at goal on home readings - Continue carvedilol, chlorthalidone, amlodipine and benezepril - Discussed low salt diet.     Current Outpatient Medications:  .  amLODipine-benazepril (LOTREL) 10-40 MG capsule, TAKE 1 CAPSULE DAILY., Disp: 90 capsule, Rfl: 3 .  calcium citrate-vitamin D (CITRACAL+D) 315-200 MG-UNIT tablet, Take 1 tablet by mouth daily., Disp: , Rfl:  .  carvedilol (COREG) 6.25 MG tablet, Take 1 tablet (6.25 mg total) by mouth 2 (two) times daily., Disp: 180 tablet, Rfl: 3 .  chlorthalidone (HYGROTON) 25 MG tablet, TAKE 1 TABLET DAILY, Disp: 90 tablet, Rfl: 1 .  Cholecalciferol (VITAMIN D-3) 1000 units CAPS, Take 1 capsule (1,000 Units total) by mouth daily., Disp: , Rfl:  .  Omega-3 Fatty Acids (FISH OIL) 1000 MG CAPS, Take 1,000 mg by mouth daily., Disp: , Rfl:  .  pravastatin (PRAVACHOL) 40 MG tablet, Take 1 tablet (40 mg total) by mouth at bedtime., Disp: 90 tablet, Rfl: 3 .  traMADol (ULTRAM) 50 MG tablet, Take 1 tablet (50 mg total) by mouth every 8 (eight) hours as needed. (Patient not taking: Reported on 03/20/2020), Disp: 10 tablet, Rfl: 0  Patient Active Problem List   Diagnosis Date Noted  . Morbid obesity ([place]) 11/02/2018  . History of colonic polyps   . Benign neoplasm of ascending colon   . Benign neoplasm of cecum   . Hypertensive nephrosclerosis 01/27/2018  .  Prediabetes 06/30/2017  . Screening for AAA (abdominal aortic aneurysm) 06/30/2017  . Welcome to Medicare preventive visit 06/30/2017  . COPD (chronic obstructive pulmonary disease) ([place]) 04/15/2017  . Hx of tobacco use, presenting hazards to health 04/17/2016  . Abnormal weight gain 03/13/2016  . Hypercholesterolemia 03/13/2016  . Medication monitoring encounter 03/13/2016  . Chronic kidney disease, stage III (moderate) 07/10/2015  . Annual physical exam 04/10/2015  . Genuine stress incontinence, male 06/23/2012  . Cystitis, radiation 05/31/2012  . Hematuria,  microscopic 05/31/2012  . ED (erectile dysfunction) of organic origin 05/31/2012  . Hx of malignant neoplasm of prostate 05/31/2012  . Microscopic hematuria 05/31/2012    Past Surgical History:  Procedure Laterality Date  . COLONOSCOPY WITH PROPOFOL N/A 07/29/2018   Procedure: COLONOSCOPY WITH BIOPSIES;  Surgeon: Lucilla Lame, MD;  Location: [facility];  Service: Endoscopy;  Laterality: N/A;  . POLYPECTOMY N/A 07/29/2018   Procedure: POLYPECTOMY;  Surgeon: Lucilla Lame, MD;  Location: [facility];  Service: Endoscopy;  Laterality: N/A;  . PROSTATE SURGERY      Family History  Problem Relation Age of Onset  . Hypertension Mother   . Aneurysm Mother 47       brain  . Hypertension Father   . Heart attack Father   . Hypertension Sister   . Hypertension Brother   . Heart failure Brother   . Heart disease Paternal Grandfather   . Hypertension Sister   . Hypertension Brother   . Cancer Brother        throat cancer  . Kidney cancer Neg Hx   . Kidney failure Neg Hx   . Prostate cancer Neg Hx   . Sickle cell anemia Neg Hx   . Tuberculosis Neg Hx     Social History   Tobacco Use  . Smoking status: Former Smoker    Packs/day: 1.00    Years: 40.00    Pack years: 40.00    Quit date: 11/2013    Years since quitting: 6.3  . Smokeless tobacco: Never Used  Vaping Use  . Vaping Use: Never used  Substance Use Topics  . Alcohol use: Yes    Alcohol/week: 16.0 standard drinks    Types: 16 Shots of liquor per week    Comment: no more than 4 whiskey drinks a day; maybe 4 times day   . Drug use: No     Allergies  Allergen Reactions  . Atorvastatin Rash    Health Maintenance  Topic Date Due  . COVID-19 Vaccine (1) Never done  . TETANUS/TDAP  11/15/2020 (Originally [birth year]/1970)  . INFLUENZA VACCINE  04/15/2020  . PNA vac Low Risk Adult (2 of 2 - PPSV23) 06/17/2020  . COLONOSCOPY  07/30/2023  . Hepatitis C Screening  Completed    Chart Review Today: I  personally reviewed active problem list, medication list, allergies, family history, social history, health maintenance, notes from last encounter, lab results, imaging with the patient/caregiver today.   Review of Systems  10 Systems reviewed and are negative for acute change except as noted in the HPI.    Objective:   Vitals:   03/20/20 1000  BP: 134/78  Pulse: 88  Resp: 18  Temp: (!) 97.3 F (36.3 C)  TempSrc: Temporal  SpO2: 98%  Weight: 243 lb 11.2 oz (110.5 kg)  Height: 5\' 7"  (1.702 m)    Body mass index is 38.17 kg/m.  Physical Exam Vitals and nursing note reviewed.  Constitutional:      General:  He is not in acute distress.    Appearance: Normal appearance. He is well-developed. He is obese. He is not ill-appearing, toxic-appearing or diaphoretic.     Interventions: Face mask in place.  HENT:     Head: Normocephalic and atraumatic.     Jaw: No trismus.     Right Ear: External ear normal.     Left Ear: External ear normal.  Eyes:     General: Lids are normal. No scleral icterus.    Conjunctiva/sclera: Conjunctivae normal.     Pupils: Pupils are equal, round, and reactive to light.  Neck:     Trachea: Trachea and phonation normal. No tracheal deviation.  Cardiovascular:     Rate and Rhythm: Normal rate and regular rhythm.     Pulses: Normal pulses.          Radial pulses are 2+ on the right side and 2+ on the left side.       Posterior tibial pulses are 2+ on the right side and 2+ on the left side.     Heart sounds: Normal heart sounds. No murmur heard.  No friction rub. No gallop.   Pulmonary:     Effort: Pulmonary effort is normal. No respiratory distress.     Breath sounds: Normal breath sounds. No stridor. No wheezing, rhonchi or rales.  Abdominal:     General: Bowel sounds are normal. There is no distension.     Palpations: Abdomen is soft.     Tenderness: There is no abdominal tenderness. There is no guarding or rebound.  Musculoskeletal:         General: Normal range of motion.     Cervical back: Normal range of motion and neck supple.     Right lower leg: No edema.     Left lower leg: No edema.  Skin:    General: Skin is warm and dry.     Capillary Refill: Capillary refill takes less than 2 seconds.     Coloration: Skin is not jaundiced.     Findings: No rash.     Nails: There is no clubbing.  Neurological:     Mental Status: He is alert.     Cranial Nerves: No dysarthria or facial asymmetry.     Motor: No tremor or abnormal muscle tone.     Gait: Gait normal.  Psychiatric:        Mood and Affect: Mood normal.        Speech: Speech normal.        Behavior: Behavior normal. Behavior is cooperative.         Assessment & Plan:     ICD-10-CM   1. Essential hypertension  I10    stable, well controlled HTN, continue same meds, heart healthy diet/exercise/DASH  2. Hyperlipidemia, unspecified hyperlipidemia type  E78.5    Good statin compliance, no SE or myalgias, continue meds/diet/lifestyle effforts  3. Prediabetes  R73.03    stable, well controlled, encouraged continued low carb/low sugar diet, work on weight loss, exercise  4. Class 2 severe obesity with serious comorbidity and body mass index (BMI) of 38.0 to 38.9 in adult, unspecified obesity type ([place])  E66.01    Z68.40    with prediabetes, HTN, HLD, and COPD, discussed meds, diet and exercise - offered referral to dietician   Most recent labs reviewed from 10/2019- cholesterol panel optimal, renal function stable, A1C stable relative to hx, no labs needed today, will repeat at next routine f/up appt  Return  in about 6 months (around 09/20/2020) for Routine follow-up.   Delsa Grana, PA-C 03/20/20 10:07 AM

## 2020-04-02 ENCOUNTER — Other Ambulatory Visit: Payer: Self-pay | Admitting: Family Medicine

## 2020-04-02 ENCOUNTER — Ambulatory Visit (INDEPENDENT_AMBULATORY_CARE_PROVIDER_SITE_OTHER): Payer: Medicare HMO

## 2020-04-02 DIAGNOSIS — I10 Essential (primary) hypertension: Secondary | ICD-10-CM | POA: Diagnosis not present

## 2020-04-02 DIAGNOSIS — E785 Hyperlipidemia, unspecified: Secondary | ICD-10-CM

## 2020-04-02 DIAGNOSIS — J449 Chronic obstructive pulmonary disease, unspecified: Secondary | ICD-10-CM | POA: Diagnosis not present

## 2020-04-02 NOTE — Chronic Care Management (AMB) (Signed)
Chronic Care Management   Initial Visit Note  04/02/2020 Name: Todd Hutchinson MRN: [id number] DOB: 08/03/1951  Primary Care Provider: Delsa Grana, PA-C Reason for referral : Chronic Care Management   Todd Hutchinson is a 69 y.o. year old male who is a primary care patient of Delsa Grana, [place]. The CCM team was consulted for assistance with chronic disease management and care coordination. A telephonic assessment was conducted today.  Review of Todd Hutchinson status, including review of consultants reports, relevant labs and test results was conducted today. Collaboration with appropriate care team members was performed as part of the comprehensive evaluation and provision of chronic care management services.    SDOH (Social Determinants of Health) assessments performed: Yes See Care Plan activities for detailed interventions related to SDOH  SDOH Interventions     Most Recent Value  SDOH Interventions  Housing Interventions Intervention Not Indicated  Transportation Interventions Intervention Not Indicated       Medications: Outpatient Encounter Medications as of 04/02/2020  Medication Sig  . amLODipine-benazepril (LOTREL) 10-40 MG capsule TAKE 1 CAPSULE DAILY.  . calcium citrate-vitamin D (CITRACAL+D) 315-200 MG-UNIT tablet Take 1 tablet by mouth daily.  . carvedilol (COREG) 6.25 MG tablet Take 1 tablet (6.25 mg total) by mouth 2 (two) times daily.  . chlorthalidone (HYGROTON) 25 MG tablet TAKE 1 TABLET DAILY  . Omega-3 Fatty Acids (FISH OIL) 1000 MG CAPS Take 1,000 mg by mouth daily.  . pravastatin (PRAVACHOL) 40 MG tablet Take 1 tablet (40 mg total) by mouth at bedtime.  . Cholecalciferol (VITAMIN D-3) 1000 units CAPS Take 1 capsule (1,000 Units total) by mouth daily.   No facility-administered encounter medications on file as of 04/02/2020.     Objective:  BP Readings from Last 3 Encounters:  03/20/20 134/78  01/09/20 (!) 155/74  11/16/19 (!) 152/70    Lab Results  Component Value Date   HGBA1C 5.9 (H) 11/10/2019    Lab Results  Component Value Date   CHOL 177 11/10/2019   HDL 59 11/10/2019   LDLCALC 96 11/10/2019   TRIG 123 11/10/2019   CHOLHDL 3.0 11/10/2019   Functional Status Survey: Is the patient deaf or have difficulty hearing?: No Does the patient have difficulty seeing, even when wearing glasses/contacts?: No Does the patient have difficulty concentrating, remembering, or making decisions?: No Does the patient have difficulty walking or climbing stairs?: No Does the patient have difficulty dressing or bathing?: No Does the patient have difficulty doing errands alone such as visiting a doctor's office or shopping?: No    Goals Addressed            This Visit's Progress   . Chronic Disease Management       CARE PLAN ENTRY (see longitudinal plan of care for additional care plan information)  Current Barriers:  . Chronic Disease Management support and education needs related to HTN, COPD, CKD and Hyperlipidemia.  Case Manager Clinical Goal(s):  Over the next 120 days, patient will: . Continue to take medications as prescribed. . Continue to attend medical appointments as scheduled. . Continue to monitor blood pressure and record readings. . Continue compliance with plan for COPD self-management.  Interventions:  . Inter-disciplinary care team collaboration (see longitudinal plan of care) . Reviewed medications and indications for use. Encouraged to continue taking medications as prescribed. Encouraged to notify care management team with concerns regarding prescription cost.  . Discussed established blood pressure parameters and indications for notifying provider. Encouraged to continue monitoring routinely  during the week if unable to monitor daily and record readings. Reports recent systolic readings have ranged in the low 140's. Reports diastolic readings have ranged in the 60's.  . Discussed current plan  for COPD self-management.  Denies recent exacerbations. Reports symptoms are well controlled without use of inhalers. Reports occasional episodes of shortness of breath with exertion but overall doing very well.  . Discussed current activity level. Reports ambulating well. He was previously evaluated for foot pain. Reports symptoms have resolved. Able to complete daily activities without difficulty. Denies fall.  . Reviewed pending appointments. Encouraged to complete scheduled medical appointments to prevent delays in care. Encouraged to notify care management team with concerns regarding transportation. Anticipates annual outreach with Gastroenterologist next month. No concerns regarding transportation. . Discussed plans for ongoing care management and follow up. Provided direct contact information.  Patient Self Care Activities:  . Self administers medications . Attends provider appointments . Calls pharmacy for medication refills . Performs ADL's independently . Performs IADL's independently . Calls provider office for new concerns or questions   Initial goal documentation        Todd Hutchinson was given information about Chronic Care Management services including:  1. CCM service includes personalized support from designated clinical staff supervised by his physician, including individualized plan of care and coordination with other care providers 2. 24/7 contact phone numbers for assistance for urgent and routine care needs. 3. Service will only be billed when office clinical staff spend 20 minutes or more in a month to coordinate care. 4. Only one practitioner may furnish and bill the service in a calendar month. 5. The patient may stop CCM services at any time (effective at the end of the month) by phone call to the office staff. 6. The patient will be responsible for cost sharing (co-pay) of up to 20% of the service fee (after annual deductible is met).  Patient agreed to services  and verbal consent obtained.     PLAN The care management team will follow-up with [name] in three months.   [facility] Care Management [phone]

## 2020-04-02 NOTE — Patient Instructions (Addendum)
Thank you for allowing the Chronic Care Management team to participate in your care.   Goals Addressed            This Visit's Progress    Chronic Disease Management       CARE PLAN ENTRY (see longitudinal plan of care for additional care plan information)  Current Barriers:   Chronic Disease Management support and education needs related to HTN, COPD, CKD and Hyperlipidemia.  Case Manager Clinical Goal(s):  Over the next 120 days, patient will:  Continue to take medications as prescribed.  Continue to attend medical appointments as scheduled.  Continue to monitor blood pressure and record readings.  Continue compliance with plan for COPD self-management.  Interventions:   Inter-disciplinary care team collaboration (see longitudinal plan of care)  Reviewed medications and indications for use. Encouraged to continue taking medications as prescribed. Encouraged to notify care management team with concerns regarding prescription cost.   Discussed established blood pressure parameters and indications for notifying provider. Encouraged to continue monitoring routinely during the week if unable to monitor daily and record readings. Reports recent systolic readings have ranged in the low 140's. Reports diastolic readings have ranged in the 60's.   Discussed current plan for COPD self-management.  Denies recent exacerbations. Reports symptoms are well controlled without use of inhalers. Reports occasional episodes of shortness of breath with exertion but overall doing very well.   Discussed current activity level. Reports ambulating well. He was previously evaluated for foot pain. Reports symptoms have resolved. Able to complete daily activities without difficulty. Denies fall.   Reviewed pending appointments. Encouraged to complete scheduled medical appointments to prevent delays in care. Encouraged to notify care management team with concerns regarding transportation. Anticipates  annual outreach with Gastroenterologist next month. No concerns regarding transportation.  Discussed plans for ongoing care management and follow up. Provided direct contact information.  Patient Self Care Activities:   Self administers medications  Attends provider appointments  Calls pharmacy for medication refills  Performs ADL's independently  Performs IADL's independently  Calls provider office for new concerns or questions   Initial goal documentation         Mr. Wilms was given information about Chronic Care Management services including:  1. CCM service includes personalized support from designated clinical staff supervised by his physician, including individualized plan of care and coordination with other care providers 2. 24/7 contact phone numbers for assistance for urgent and routine care needs. 3. Service will only be billed when office clinical staff spend 20 minutes or more in a month to coordinate care. 4. Only one practitioner may furnish and bill the service in a calendar month. 5. The patient may stop CCM services at any time (effective at the end of the month) by phone call to the office staff. 6. The patient will be responsible for cost sharing (co-pay) of up to 20% of the service fee (after annual deductible is met).  Patient agreed to services and verbal consent obtained.    Mr. Hislop verbalized understanding of the information of the information discussed during the telephonic outreach today. Declined need for a mailed/printed copy of the instructions.   The care management team will follow-up with Mr. Gayton in three months.   [facility] Care [facility] [phone]

## 2020-04-05 DIAGNOSIS — R69 Illness, unspecified: Secondary | ICD-10-CM | POA: Diagnosis not present

## 2020-04-09 ENCOUNTER — Encounter: Payer: Self-pay | Admitting: Family Medicine

## 2020-04-11 DIAGNOSIS — G4733 Obstructive sleep apnea (adult) (pediatric): Secondary | ICD-10-CM | POA: Diagnosis not present

## 2020-04-16 ENCOUNTER — Ambulatory Visit: Payer: Self-pay

## 2020-04-16 NOTE — Chronic Care Management (AMB) (Signed)
  Chronic Care Management   Note  04/16/2020 Name: Todd Hutchinson MRN: [id number] DOB: 1950/11/05     Care Coordination Only: Resources and documents submitted     Follow up plan: The care management team will reach out to [name] as scheduled in October.    [facility] Care Management [phone]

## 2020-04-30 DIAGNOSIS — R69 Illness, unspecified: Secondary | ICD-10-CM | POA: Diagnosis not present

## 2020-05-03 DIAGNOSIS — R69 Illness, unspecified: Secondary | ICD-10-CM | POA: Diagnosis not present

## 2020-05-11 DIAGNOSIS — G4733 Obstructive sleep apnea (adult) (pediatric): Secondary | ICD-10-CM | POA: Diagnosis not present

## 2020-06-09 DIAGNOSIS — G4733 Obstructive sleep apnea (adult) (pediatric): Secondary | ICD-10-CM | POA: Diagnosis not present

## 2020-06-18 ENCOUNTER — Ambulatory Visit: Payer: Self-pay

## 2020-06-18 DIAGNOSIS — I10 Essential (primary) hypertension: Secondary | ICD-10-CM

## 2020-06-18 DIAGNOSIS — J449 Chronic obstructive pulmonary disease, unspecified: Secondary | ICD-10-CM

## 2020-06-18 NOTE — Chronic Care Management (AMB) (Signed)
  Chronic Care Management   Follow Up Note   06/18/2020 Name: Akbar Sacra MRN: [id number] DOB: 29-Mar-1951  Primary Care Provider: Delsa Grana, PA-C Reason for referral : Chronic Care Management   Yuvan Medinger is a 69 y.o. year old male who is a primary care patient of Delsa Grana, [place]. He is currently engaged with the chronic care management team. A routine telephonic outreach was conducted today.  Review of Mr. Fitzhenry status, including review of consultants reports, relevant labs and test results was conducted today. Collaboration with appropriate care team members was performed as part of the comprehensive evaluation and provision of chronic care management services.    SDOH (Social Determinants of Health) assessments performed: No     Outpatient Encounter Medications as of 06/18/2020  Medication Sig  . amLODipine-benazepril (LOTREL) 10-40 MG capsule TAKE 1 CAPSULE DAILY.  . calcium citrate-vitamin D (CITRACAL+D) 315-200 MG-UNIT tablet Take 1 tablet by mouth daily.  . carvedilol (COREG) 6.25 MG tablet Take 1 tablet (6.25 mg total) by mouth 2 (two) times daily.  . chlorthalidone (HYGROTON) 25 MG tablet TAKE 1 TABLET DAILY  . Cholecalciferol (VITAMIN D-3) 1000 units CAPS Take 1 capsule (1,000 Units total) by mouth daily.  . Omega-3 Fatty Acids (FISH OIL) 1000 MG CAPS Take 1,000 mg by mouth daily.  . pravastatin (PRAVACHOL) 40 MG tablet Take 1 tablet (40 mg total) by mouth at bedtime.   No facility-administered encounter medications on file as of 06/18/2020.       Goals Addressed            This Visit's Progress   . Chronic Disease Management   On track    CARE PLAN ENTRY (see longitudinal plan of care for additional care plan information)  Current Barriers:  . Chronic Disease Management support and education needs related to HTN, COPD, CKD and Hyperlipidemia.  Case Manager Clinical Goal(s):  Over the next 120 days, patient will: . Continue to  take medications as prescribed.-Complete . Continue to attend medical appointments as scheduled. . Continue to monitor blood pressure and record readings. . Continue compliance with plan for COPD self-management.-Complete  Interventions:  .       Inter-disciplinary care team collaboration (see longitudinal plan of care) . Discussed medications, BP and current plan for COPD self-management. Continues to take medications as prescribed. Denies concerns regarding medication management or prescription costs. Reports BP readings have been within range. Reports good nutritional intake. His COPD is still well managed without inhaler. No recent episodes of shortness of breath or decline in activity tolerance. Continues to perform ADL's and IADL's independently.    . Discussed plans for care management follow-up. Mr. Haskew reports doing well over the past few months. Denies urgent concerns or changes in care management needs.   Patient Self Care Activities:  . Self administers medications . Attends provider appointments . Calls pharmacy for medication refills . Performs ADL's independently . Performs IADL's independently . Calls provider office for new concerns or questions   Please see past updates related to this goal by clicking on the "Past Updates" button in the selected goal           PLAN A member of the care management team will follow-up with Mr. Kabel in two months.    [facility] Care [facility] [phone]

## 2020-06-20 NOTE — Patient Instructions (Addendum)
Thank you for allowing the Chronic Care Management team to participate in your care.   Goals Addressed            This Visit's Progress   . Chronic Disease Management   On track    CARE PLAN ENTRY (see longitudinal plan of care for additional care plan information)  Current Barriers:  . Chronic Disease Management support and education needs related to HTN, COPD, CKD and Hyperlipidemia.  Case Manager Clinical Goal(s):  Over the next 120 days, patient will: . Continue to take medications as prescribed.-Complete . Continue to attend medical appointments as scheduled. . Continue to monitor blood pressure and record readings. . Continue compliance with plan for COPD self-management.-Complete  Interventions:  .       Inter-disciplinary care team collaboration (see longitudinal plan of care) . Discussed medications, BP and current plan for COPD self-management. Continues to take medications as prescribed. Denies concerns regarding medication management or prescription costs. Reports BP readings have been within range. Reports good nutritional intake. His COPD is still well managed without inhaler. No recent episodes of shortness of breath or decline in activity tolerance. Continues to perform ADL's and IADL's independently.    . Discussed plans for care management follow-up. Mr. Reiland reports doing well over the past few months. Denies urgent concerns or changes in care management needs.   Patient Self Care Activities:  . Self administers medications . Attends provider appointments . Calls pharmacy for medication refills . Performs ADL's independently . Performs IADL's independently . Calls provider office for new concerns or questions   Please see past updates related to this goal by clicking on the "Past Updates" button in the selected goal        Mr. Kornegay verbalized understanding of the information discussed during the telephonic outreach today. Declined need for  mailed/printed instructions.   A member of the care management team will follow-up with Mr. Lunney in two months.    [facility] Care Management [facility] [phone]

## 2020-07-05 ENCOUNTER — Ambulatory Visit: Payer: Medicare HMO

## 2020-07-11 DIAGNOSIS — R69 Illness, unspecified: Secondary | ICD-10-CM | POA: Diagnosis not present

## 2020-07-11 DIAGNOSIS — G4733 Obstructive sleep apnea (adult) (pediatric): Secondary | ICD-10-CM | POA: Diagnosis not present

## 2020-07-16 ENCOUNTER — Telehealth: Payer: Self-pay | Admitting: Family Medicine

## 2020-07-16 NOTE — Telephone Encounter (Signed)
Copied from [place] [id number]. Topic: Medicare AWV >> Jul 16, 2020 10:40 AM Cher Nakai R wrote: Reason for CRM:   Left message for patient to call back and schedule the Medicare Annual Wellness Visit (AWV) in office or virtual  Last AWV 07/05/2019  Please schedule at anytime with [place].  40 minute appointment  Any questions, please contact me at [phone]

## 2020-07-25 DIAGNOSIS — R69 Illness, unspecified: Secondary | ICD-10-CM | POA: Diagnosis not present

## 2020-07-25 DIAGNOSIS — E669 Obesity, unspecified: Secondary | ICD-10-CM | POA: Diagnosis not present

## 2020-07-25 DIAGNOSIS — R0602 Shortness of breath: Secondary | ICD-10-CM | POA: Diagnosis not present

## 2020-07-25 DIAGNOSIS — G4733 Obstructive sleep apnea (adult) (pediatric): Secondary | ICD-10-CM | POA: Diagnosis not present

## 2020-08-10 DIAGNOSIS — G4733 Obstructive sleep apnea (adult) (pediatric): Secondary | ICD-10-CM | POA: Diagnosis not present

## 2020-08-20 ENCOUNTER — Telehealth: Payer: Self-pay

## 2020-08-20 NOTE — Telephone Encounter (Signed)
  Chronic Care Management   Outreach Note  08/20/2020 Name: Todd Hutchinson MRN: [id number] DOB: 03-18-1951  Primary Care Provider: Delsa Grana, PA-C Reason for referral : Chronic Care Management    An unsuccessful telephone outreach was attempted today. Mr. Syme is currently enrolled in the Chronic Care Management program.    Follow Up Plan:  A HIPAA compliant voice message was left today requesting a return call.    [facility] Care Management [facility] [phone]

## 2020-08-23 DIAGNOSIS — R69 Illness, unspecified: Secondary | ICD-10-CM | POA: Diagnosis not present

## 2020-09-13 DIAGNOSIS — G4733 Obstructive sleep apnea (adult) (pediatric): Secondary | ICD-10-CM | POA: Diagnosis not present

## 2020-09-20 ENCOUNTER — Ambulatory Visit: Payer: Medicare HMO | Admitting: Family Medicine

## 2020-10-05 ENCOUNTER — Ambulatory Visit: Payer: Self-pay

## 2020-10-05 DIAGNOSIS — J449 Chronic obstructive pulmonary disease, unspecified: Secondary | ICD-10-CM

## 2020-10-05 NOTE — Chronic Care Management (AMB) (Signed)
  Chronic Care Management   Follow Up Note   10/05/2020 Name: Todd Hutchinson MRN: [id number] DOB: [birth year]  Primary Care Provider: Delsa Grana, PA-C Reason for referral : Chronic Care Management  Todd Hutchinson is a 70 y.o. year old male who is a primary care patient of Delsa Grana, [place]. He has met his care management goals.     Review of Todd Hutchinson status, including review of consultants reports, relevant labs and test results was conducted today. Collaboration with appropriate care team members was performed as part of the comprehensive evaluation and provision of chronic care management services.     SDOH (Social Determinants of Health) assessments performed: No     Outpatient Encounter Medications as of 10/05/2020  Medication Sig  . amLODipine-benazepril (LOTREL) 10-40 MG capsule TAKE 1 CAPSULE DAILY.  . calcium citrate-vitamin D (CITRACAL+D) 315-200 MG-UNIT tablet Take 1 tablet by mouth daily.  . carvedilol (COREG) 6.25 MG tablet Take 1 tablet (6.25 mg total) by mouth 2 (two) times daily.  . chlorthalidone (HYGROTON) 25 MG tablet TAKE 1 TABLET DAILY  . Cholecalciferol (VITAMIN D-3) 1000 units CAPS Take 1 capsule (1,000 Units total) by mouth daily.  . Omega-3 Fatty Acids (FISH OIL) 1000 MG CAPS Take 1,000 mg by mouth daily.  . pravastatin (PRAVACHOL) 40 MG tablet Take 1 tablet (40 mg total) by mouth at bedtime.   No facility-administered encounter medications on file as of 10/05/2020.       Goals Addressed            This Visit's Progress   . COMPLETED: Chronic Disease Management       CARE PLAN ENTRY (see longitudinal plan of care for additional care plan information)  Current Barriers:  . Chronic Disease Management support and education needs related to HTN, COPD, CKD and Hyperlipidemia.  Case Manager Clinical Goal(s):  Over the next 120 days, patient will: . Continue to take medications as prescribed.-Complete . Continue to attend medical  appointments as scheduled. . Continue to monitor blood pressure and record readings. . Continue compliance with plan for COPD self-management.-Complete  Interventions:  . Inter-disciplinary care team collaboration (see longitudinal plan of care)  . Discussed care management needs. Todd Hutchinson has met his care management goals. He remains compliant with medications and treatment recommendations. Reports BP and COPD remain well controlled. Reports using his CPAP consistently as recommended. No new concerns or changes in care management needs. He is scheduled for PCP follow-up on 10/11/20. He agreed to contact the care management team if additional assistance is required.   Patient Self Care Activities:  . Self administers medications . Attends provider appointments . Calls pharmacy for medication refills . Performs ADL's independently . Performs IADL's independently . Calls provider office for new concerns or questions   Please see past updates related to this goal by clicking on the "Past Updates" button in the selected goal           PLAN Todd Hutchinson has met his care management goals. He is scheduled for PCP outreach on 10/11/20. Agreed to call if additional care management outreach is required.     [facility] Care Management [facility] [phone]

## 2020-10-05 NOTE — Patient Instructions (Addendum)
Thank you for allowing the Chronic Care Management team to participate in your care.   Goals Addressed            This Visit's Progress    COMPLETED: Chronic Disease Management       CARE PLAN ENTRY (see longitudinal plan of care for additional care plan information)  Current Barriers:   Chronic Disease Management support and education needs related to HTN, COPD, CKD and Hyperlipidemia.  Case Manager Clinical Goal(s):  Over the next 120 days, patient will:  Continue to take medications as prescribed.-Complete  Continue to attend medical appointments as scheduled.  Continue to monitor blood pressure and record readings.  Continue compliance with plan for COPD self-management.-Complete  Interventions:   Inter-disciplinary care team collaboration (see longitudinal plan of care)   Discussed care management needs. Mr. Lagrand has met his care management goals. He remains compliant with medications and treatment recommendations. Reports BP and COPD remain well controlled. Reports using his CPAP consistently as recommended. No new concerns or changes in care management needs. He is scheduled for PCP follow-up on 10/11/20. He agreed to contact the care management team if additional assistance is required.   Patient Self Care Activities:   Self administers medications  Attends provider appointments  Calls pharmacy for medication refills  Performs ADL's independently  Performs IADL's independently  Calls provider office for new concerns or questions   Please see past updates related to this goal by clicking on the "Past Updates" button in the selected goal          Mr. Mustard verbalized understanding of the information discussed during the brief telephonic outreach today. He has met his care management goals. He is scheduled for PCP outreach on 10/11/20. Agreed to call if additional care management outreach is required.     [facility] Care  Management [facility] [phone]

## 2020-10-11 ENCOUNTER — Ambulatory Visit (INDEPENDENT_AMBULATORY_CARE_PROVIDER_SITE_OTHER): Payer: Medicare HMO | Admitting: Family Medicine

## 2020-10-11 ENCOUNTER — Other Ambulatory Visit: Payer: Self-pay

## 2020-10-11 ENCOUNTER — Encounter: Payer: Self-pay | Admitting: Family Medicine

## 2020-10-11 VITALS — BP 142/78 | HR 99 | Temp 98.3°F | Resp 16 | Ht 67.0 in | Wt 239.0 lb

## 2020-10-11 DIAGNOSIS — N183 Chronic kidney disease, stage 3 unspecified: Secondary | ICD-10-CM | POA: Diagnosis not present

## 2020-10-11 DIAGNOSIS — R7303 Prediabetes: Secondary | ICD-10-CM | POA: Diagnosis not present

## 2020-10-11 DIAGNOSIS — Z6838 Body mass index (BMI) 38.0-38.9, adult: Secondary | ICD-10-CM | POA: Diagnosis not present

## 2020-10-11 DIAGNOSIS — I499 Cardiac arrhythmia, unspecified: Secondary | ICD-10-CM

## 2020-10-11 DIAGNOSIS — I10 Essential (primary) hypertension: Secondary | ICD-10-CM | POA: Diagnosis not present

## 2020-10-11 DIAGNOSIS — E559 Vitamin D deficiency, unspecified: Secondary | ICD-10-CM

## 2020-10-11 DIAGNOSIS — Z5181 Encounter for therapeutic drug level monitoring: Secondary | ICD-10-CM

## 2020-10-11 DIAGNOSIS — E785 Hyperlipidemia, unspecified: Secondary | ICD-10-CM | POA: Diagnosis not present

## 2020-10-11 DIAGNOSIS — D649 Anemia, unspecified: Secondary | ICD-10-CM

## 2020-10-11 DIAGNOSIS — J449 Chronic obstructive pulmonary disease, unspecified: Secondary | ICD-10-CM

## 2020-10-11 DIAGNOSIS — Z23 Encounter for immunization: Secondary | ICD-10-CM | POA: Diagnosis not present

## 2020-10-11 MED ORDER — CHLORTHALIDONE 50 MG PO TABS: 50.0000 mg | ORAL_TABLET | Freq: Every day | ORAL | 1 refills | Status: DC

## 2020-10-11 NOTE — Progress Notes (Signed)
Name: Todd Hutchinson   MRN: [id number]    DOB: May 05, 1951   Date:10/11/2020       Progress Note  Chief Complaint  Patient presents with  . Follow-up  . Hypertension    Pt states stopped coreg due to stomach concerns  . Hyperlipidemia     Subjective:   Todd Hutchinson is a 70 y.o. male, presents to clinic for routine f/up   HTN - managed with amlodipine-benazepril, chlorthalidone and coreg - he stopped coreg on his own due to GI SE Blood pressure today is uncontrolled - he reports being sensitive to several BP meds, also notes HTN has been difficult to control BP Readings from Last 3 Encounters:  10/11/20 (!) 142/78  03/20/20 134/78  01/09/20 (!) 155/74  Pt denies CP, SOB, exertional sx, LE edema, palpitation, Ha's, visual disturbances, lightheadedness, hypotension, syncope.    HLD- pravastatin - good compliance, no CP, claudication sx  CKD 3 - consulted with nephrology but they said renal function was excellent and discharged him after visit last May 2021 - GFR and labs reviewed - has been >60 for a few years Pt also consulted with nephrology for hematuria - he denies any urinary sx and blood in urine Renal function recent labs:  Lab Results  Component Value Date   GFRAA 60 11/10/2019   GFRAA 71 02/03/2019   GFRAA 65 07/02/2018    Lab Results  Component Value Date   CREATININE 1.40 (H) 11/10/2019   BUN 29 (H) 11/10/2019   NA 139 11/10/2019   K 4.3 11/10/2019   CL 104 11/10/2019   CO2 25 11/10/2019       Prediabetes - Recent pertinent labs: Lab Results  Component Value Date   HGBA1C 5.9 (H) 11/10/2019   HGBA1C 5.8 (H) 02/03/2019   HGBA1C 5.9 (H) 07/02/2018  Stable, not on meds, no particular diet efforts - no family hx of DM   HR irregular today on exam, pt denies palpitations, chest pain, near syncope, orthopnea, lower extremity edema, exertional symptoms -he denies any past cardiac history that he can recall  Vitamin D deficiency last  labs were a few years ago -28 almost normal, he continues to take a vitamin D supplement     Current Outpatient Medications:  .  amLODipine-benazepril (LOTREL) 10-40 MG capsule, TAKE 1 CAPSULE DAILY., Disp: 90 capsule, Rfl: 3 .  calcium citrate-vitamin D (CITRACAL+D) 315-200 MG-UNIT tablet, Take 1 tablet by mouth daily., Disp: , Rfl:  .  chlorthalidone (HYGROTON) 50 MG tablet, Take 1 tablet (50 mg total) by mouth daily., Disp: 90 tablet, Rfl: 1 .  Cholecalciferol (VITAMIN D-3) 1000 units CAPS, Take 1 capsule (1,000 Units total) by mouth daily., Disp: , Rfl:  .  Omega-3 Fatty Acids (FISH OIL) 1000 MG CAPS, Take 1,000 mg by mouth daily., Disp: , Rfl:  .  pravastatin (PRAVACHOL) 40 MG tablet, Take 1 tablet (40 mg total) by mouth at bedtime., Disp: 90 tablet, Rfl: 3  Patient Active Problem List   Diagnosis Date Noted  . Class 2 severe obesity with serious comorbidity and body mass index (BMI) of 38.0 to 38.9 in adult ([place]) 11/02/2018  . History of colonic polyps   . Benign neoplasm of ascending colon   . Benign neoplasm of cecum   . Hypertensive nephrosclerosis 01/27/2018  . Prediabetes 06/30/2017  . COPD (chronic obstructive pulmonary disease) ([place]) 04/15/2017  . Hx of tobacco use, presenting hazards to health 04/17/2016  . Hypercholesterolemia 03/13/2016  . Benign  essential hypertension 07/10/2015  . Stage 3a chronic kidney disease ([place]) 07/10/2015  . Cystitis, radiation 05/31/2012  . Hematuria, microscopic 05/31/2012  . ED (erectile dysfunction) of organic origin 05/31/2012  . Hx of malignant neoplasm of prostate 05/31/2012    Past Surgical History:  Procedure Laterality Date  . COLONOSCOPY WITH PROPOFOL N/A 07/29/2018   Procedure: COLONOSCOPY WITH BIOPSIES;  Surgeon: Lucilla Lame, MD;  Location: [place];  Service: Endoscopy;  Laterality: N/A;  . POLYPECTOMY N/A 07/29/2018   Procedure: POLYPECTOMY;  Surgeon: Lucilla Lame, MD;  Location: [place];  Service:  Endoscopy;  Laterality: N/A;  . PROSTATE SURGERY      Family History  Problem Relation Age of Onset  . Hypertension Mother   . Aneurysm Mother 77       brain  . Hypertension Father   . Heart attack Father   . Hypertension Sister   . Hypertension Brother   . Heart failure Brother   . Heart disease Paternal Grandfather   . Hypertension Sister   . Hypertension Brother   . Cancer Brother        throat cancer  . Kidney cancer Neg Hx   . Kidney failure Neg Hx   . Prostate cancer Neg Hx   . Sickle cell anemia Neg Hx   . Tuberculosis Neg Hx     Social History   Tobacco Use  . Smoking status: Former Smoker    Packs/day: 1.00    Years: 40.00    Pack years: 40.00    Quit date: 11/2013    Years since quitting: 6.9  . Smokeless tobacco: Never Used  Vaping Use  . Vaping Use: Never used  Substance Use Topics  . Alcohol use: Yes    Alcohol/week: 16.0 standard drinks    Types: 16 Shots of liquor per week    Comment: no more than 4 whiskey drinks a day; maybe 4 times day   . Drug use: No     Allergies  Allergen Reactions  . Atorvastatin Rash    Health Maintenance  Topic Date Due  . COVID-19 Vaccine (3 - Moderna risk 4-dose series) 01/07/2020  . PNA vac Low Risk Adult (2 of 2 - PPSV23) 06/17/2020  . TETANUS/TDAP  11/15/2020 (Originally 12/08/1970)  . COLONOSCOPY (Pts 45-72yrs Insurance coverage will need to be confirmed)  07/30/2023  . INFLUENZA VACCINE  Completed  . Hepatitis C Screening  Completed    Chart Review Today: I personally reviewed active problem list, medication list, allergies, family history, social history, health maintenance, notes from last encounter, lab results, imaging with the patient/caregiver today.   Review of Systems  Constitutional: Negative.   HENT: Negative.   Eyes: Negative.   Respiratory: Negative.   Cardiovascular: Negative.   Gastrointestinal: Negative.   Endocrine: Negative.   Genitourinary: Negative.   Musculoskeletal: Negative.    Skin: Negative.   Allergic/Immunologic: Negative.   Neurological: Negative.   Hematological: Negative.   Psychiatric/Behavioral: Negative.   All other systems reviewed and are negative.    Objective:   Vitals:   10/11/20 1128 10/11/20 1132  BP: (!) 144/84 (!) 142/78  Pulse: 99   Resp: 16   Temp: 98.3 F (36.8 C)   SpO2: 98%   Weight: 239 lb (108.4 kg)   Height: 5\' 7"  (1.702 m)     Body mass index is 37.43 kg/m.  Physical Exam Vitals and nursing note reviewed.  Constitutional:      General: He is not in  acute distress.    Appearance: Normal appearance. He is well-developed. He is obese. He is not ill-appearing, toxic-appearing or diaphoretic.     Interventions: Face mask in place.  HENT:     Head: Normocephalic and atraumatic.     Jaw: No trismus.     Right Ear: Tympanic membrane, ear canal and external ear normal. There is no impacted cerumen.     Left Ear: Tympanic membrane, ear canal and external ear normal. There is no impacted cerumen.  Eyes:     General: Lids are normal. No scleral icterus.       Right eye: No discharge.        Left eye: No discharge.     Conjunctiva/sclera: Conjunctivae normal.  Neck:     Trachea: Trachea and phonation normal. No tracheal deviation.  Cardiovascular:     Rate and Rhythm: Normal rate. Rhythm regularly irregular. Frequent extrasystoles are present.    Chest Wall: PMI is not displaced.     Pulses: Normal pulses.          Radial pulses are 2+ on the right side and 2+ on the left side.       Posterior tibial pulses are 2+ on the right side and 2+ on the left side.     Heart sounds: Normal heart sounds. No murmur heard. No friction rub. No gallop.   Pulmonary:     Effort: Pulmonary effort is normal. No respiratory distress.     Breath sounds: Normal breath sounds. No stridor. No wheezing, rhonchi or rales.  Abdominal:     General: Bowel sounds are normal. There is no distension.     Palpations: Abdomen is soft.   Musculoskeletal:     Right lower leg: No edema.     Left lower leg: No edema.  Skin:    General: Skin is warm and dry.     Coloration: Skin is not jaundiced.     Findings: No rash.     Nails: There is no clubbing.  Neurological:     Mental Status: He is alert. Mental status is at baseline.     Cranial Nerves: No dysarthria or facial asymmetry.     Motor: No tremor or abnormal muscle tone.     Gait: Gait normal.  Psychiatric:        Mood and Affect: Mood normal.        Speech: Speech normal.        Behavior: Behavior normal. Behavior is cooperative.       ECG interpretation-EKG machine having problems today very difficult to obtain   Date: 10/11/20  Rate:  97  Rhythm: sinus rhythm with frequent PVC and PAC's  QRS Axis: normal  Intervals: normal  ST/T Wave abnormalities: non-specific t-wave abnormalities II, III aVF, no ST elevation or depression  Conduction Disutrbances: none  Narrative Interpretation:   Old EKG Reviewed: 04/21/2019 - more frequent premature beats and otherwise no other significant changes noted     Assessment & Plan:   1. Essential hypertension BP elevated, not at goal - pt stopped coreg due to SE Reviewed past meds - discussed with pt - he would like to increase chlorthalidone dose - used to be on 50 mg in the past  He has been on coreg and metoprolol in the past w/o any past HF or arrhythmia dx that he knows of - he had irregular rhythm today - frequent PVC/PACs with his physical exam they seemed to occur every 3-4 beats consistently  Worse than old EKG - but pt is asx and there was no aFib - pt will monitor for any symptoms - he would like to just try increasing chlorthalidone and will f/up in 1-2 months to have his HR/rhythm possibly EKG and BP rechecked  - COMPLETE METABOLIC PANEL WITH GFR  2. Hyperlipidemia, unspecified hyperlipidemia type Compliant with meds, no SE, no myalgias, fatigue or jaundice Diet and exercise recommendations for HLD  reviewed - Lipid panel - COMPLETE METABOLIC PANEL WITH GFR  3. Class 2 severe obesity with serious comorbidity and body mass index (BMI) of 38.0 to 38.9 in adult, unspecified obesity type (H[facility]) Some weight loss, BMI down to 37.43, still morbidly obese with multiple comorbidities - including HTN, HLD, COPD, and prediabetes - COMPLETE METABOLIC PANEL WITH GFR - Hemoglobin A1C  4. Prediabetes Has been stable, no sx, no family hx, recheck - COMPLETE METABOLIC PANEL WITH GFR - Hemoglobin A1C  5. Encounter for medication monitoring - Lipid panel - COMPLETE METABOLIC PANEL WITH GFR - Hemoglobin A1C - CBC with Differential/Platelet  6. Anemia, unspecified type Hx of anemia - no labs for 3+ years - CBC with Differential/Platelet  7. Irregular heart rhythm See #1 Pt asx, PAC's and PVC's frequent - nearly in trigeminy, will monitor over the next 1-2 months with stopping coreg - if he becomes sx or frequent PVCs continue may need to adjust meds or have him consult with cardiology - EKG 12-Lead - TSH  8. Need for pneumococcal vaccination - Pneumococcal polysaccharide vaccine 23-valent greater than or equal to 2yo subcutaneous/IM  9. Stage 3 chronic kidney disease, unspecified whether stage 3a or 3b CKD ([place]) Discharged from nephrology - will monitor - COMPLETE METABOLIC PANEL WITH GFR  10. Vitamin D deficiency Last labs were near normal, he has continued supplement - will monitor CMP/calcium - no need to repeat vit D level   11. Chronic obstructive pulmonary disease, unspecified COPD type ([place]) Hx of, no current sx, former heavy smoker, not using any inhalers, no recent exacerbation   Return for 1-2 HTN/labs f/up with med changes (virtual OK if pt wants and check VS at home).   Delsa Grana, PA-C 10/11/20 11:57 AM

## 2020-10-12 DIAGNOSIS — G4733 Obstructive sleep apnea (adult) (pediatric): Secondary | ICD-10-CM | POA: Diagnosis not present

## 2020-10-12 LAB — CBC WITH DIFFERENTIAL/PLATELET
Absolute Monocytes: 593 cells/uL (ref 200–950)
Basophils Absolute: 30 cells/uL (ref 0–200)
Basophils Relative: 0.4 %
Eosinophils Absolute: 173 cells/uL (ref 15–500)
Eosinophils Relative: 2.3 %
HCT: 37.2 % — ABNORMAL LOW (ref 38.5–50.0)
Hemoglobin: 12.1 g/dL — ABNORMAL LOW (ref 13.2–17.1)
Lymphs Abs: 1290 cells/uL (ref 850–3900)
MCH: 27.8 pg (ref 27.0–33.0)
MCHC: 32.5 g/dL (ref 32.0–36.0)
MCV: 85.5 fL (ref 80.0–100.0)
MPV: 12.8 fL — ABNORMAL HIGH (ref 7.5–12.5)
Monocytes Relative: 7.9 %
Neutro Abs: 5415 cells/uL (ref 1500–7800)
Neutrophils Relative %: 72.2 %
Platelets: 185 10*3/uL (ref 140–400)
RBC: 4.35 10*6/uL (ref 4.20–5.80)
RDW: 15.2 % — ABNORMAL HIGH (ref 11.0–15.0)
Total Lymphocyte: 17.2 %
WBC: 7.5 10*3/uL (ref 3.8–10.8)

## 2020-10-12 LAB — COMPLETE METABOLIC PANEL WITH GFR
AG Ratio: 1.3 (calc) (ref 1.0–2.5)
ALT: 15 U/L (ref 9–46)
AST: 11 U/L (ref 10–35)
Albumin: 4.3 g/dL (ref 3.6–5.1)
Alkaline phosphatase (APISO): 32 U/L — ABNORMAL LOW (ref 35–144)
BUN: 19 mg/dL (ref 7–25)
CO2: 28 mmol/L (ref 20–32)
Calcium: 10.6 mg/dL — ABNORMAL HIGH (ref 8.6–10.3)
Chloride: 102 mmol/L (ref 98–110)
Creat: 1.2 mg/dL (ref 0.70–1.25)
GFR, Est African American: 72 mL/min/{1.73_m2} (ref >=60)
GFR, Est Non African American: 62 mL/min/{1.73_m2} (ref >=60)
Globulin: 3.2 g/dL (calc) (ref 1.9–3.7)
Glucose, Bld: 106 mg/dL — ABNORMAL HIGH (ref 65–99)
Potassium: 4.1 mmol/L (ref 3.5–5.3)
Sodium: 141 mmol/L (ref 135–146)
Total Bilirubin: 0.5 mg/dL (ref 0.2–1.2)
Total Protein: 7.5 g/dL (ref 6.1–8.1)

## 2020-10-12 LAB — HEMOGLOBIN A1C
Hgb A1c MFr Bld: 5.8 % of total Hgb — ABNORMAL HIGH (ref <5.7)
Mean Plasma Glucose: 120 mg/dL
eAG (mmol/L): 6.6 mmol/L

## 2020-10-12 LAB — LIPID PANEL
Cholesterol: 177 mg/dL (ref <200)
HDL: 70 mg/dL (ref >=40)
LDL Cholesterol (Calc): 89 mg/dL (calc)
Non-HDL Cholesterol (Calc): 107 mg/dL (calc) (ref <130)
Total CHOL/HDL Ratio: 2.5 (calc) (ref <5.0)
Triglycerides: 87 mg/dL (ref <150)

## 2020-10-12 LAB — TSH: TSH: 1.67 mIU/L (ref 0.40–4.50)

## 2020-10-16 ENCOUNTER — Other Ambulatory Visit: Payer: Self-pay

## 2020-10-16 MED ORDER — PRAVASTATIN SODIUM 40 MG PO TABS: 40.0000 mg | ORAL_TABLET | Freq: Every day | ORAL | 1 refills | Status: DC

## 2020-10-18 DIAGNOSIS — H35033 Hypertensive retinopathy, bilateral: Secondary | ICD-10-CM | POA: Diagnosis not present

## 2020-10-18 DIAGNOSIS — E119 Type 2 diabetes mellitus without complications: Secondary | ICD-10-CM | POA: Diagnosis not present

## 2020-10-18 DIAGNOSIS — H2520 Age-related cataract, morgagnian type, unspecified eye: Secondary | ICD-10-CM | POA: Diagnosis not present

## 2020-10-19 ENCOUNTER — Telehealth: Payer: Self-pay | Admitting: Family Medicine

## 2020-10-19 NOTE — Telephone Encounter (Signed)
Clarissa, from [place], stating that the pt has to prescriptions for chlorthalidone on file. She states that she is needing to verify wether pt is supposed to be on both, or if one over the other. Please advise.      Callback #[phone] opt 2  Reference # [id number]

## 2020-10-22 NOTE — Telephone Encounter (Signed)
Left message for patient to call office to go over medications that he have

## 2020-10-22 NOTE — Telephone Encounter (Signed)
Spoke to patient and he stated he is taking the Chlorthalidone and the amlodipine benazepril. IS this correct so I can call [facility]

## 2020-10-23 ENCOUNTER — Ambulatory Visit (INDEPENDENT_AMBULATORY_CARE_PROVIDER_SITE_OTHER): Payer: Medicare HMO | Admitting: Emergency Medicine

## 2020-10-23 DIAGNOSIS — D649 Anemia, unspecified: Secondary | ICD-10-CM

## 2020-10-23 NOTE — Telephone Encounter (Signed)
Spoke to patient he is taking medication as you prescribed, amlodipine benazepril and chlorthalidone with no issues

## 2020-10-23 NOTE — Telephone Encounter (Signed)
Please f/up with the initial note -  "Clarissa, from [place], stating that the pt has to prescriptions for chlorthalidone on file. She states that she is needing to verify wether pt is supposed to be on both, or if one over the other. Please advise.     Callback #[id number] opt 2"   Only needs to take the higher dose of chlorthalidone per the last OV note and plan    Per last Rx - note to pharmacy and documented in OV note -   chlorthalidone (HYGROTON) 50 MG tablet 90 tablet 1 10/11/2020    Sig - Route: Take 1 tablet (50 mg total) by mouth daily. - Oral   Sent to pharmacy as: chlorthalidone (HYGROTON) 50 MG tablet   Notes to Pharmacy: Dose increase - d/c 25 mg and start 50 mg daily   E-Prescribing Status: Receipt confirmed by pharmacy (10/11/2020 11:53 AM EST)    Delsa Grana, PA-C

## 2020-10-23 NOTE — Telephone Encounter (Signed)
50 mg on chart - pharmacy needs to fill that one D/C past lower doses - dose change

## 2020-10-24 NOTE — Telephone Encounter (Signed)
Patient stated he already has the 50 mg

## 2020-10-30 LAB — HEMOCCULT GUIAC POC 1CARD (OFFICE)
Card #2 Fecal Occult Blod, POC: NEGATIVE
Card #3 Fecal Occult Blood, POC: NEGATIVE
Fecal Occult Blood, POC: NEGATIVE

## 2020-11-22 ENCOUNTER — Telehealth: Payer: Medicare HMO | Admitting: Family Medicine

## 2020-12-05 DIAGNOSIS — G4733 Obstructive sleep apnea (adult) (pediatric): Secondary | ICD-10-CM | POA: Diagnosis not present

## 2020-12-11 ENCOUNTER — Encounter: Payer: Self-pay | Admitting: Family Medicine

## 2020-12-11 ENCOUNTER — Telehealth (INDEPENDENT_AMBULATORY_CARE_PROVIDER_SITE_OTHER): Payer: Medicare HMO | Admitting: Family Medicine

## 2020-12-11 ENCOUNTER — Other Ambulatory Visit: Payer: Self-pay

## 2020-12-11 DIAGNOSIS — Z6838 Body mass index (BMI) 38.0-38.9, adult: Secondary | ICD-10-CM

## 2020-12-11 DIAGNOSIS — J449 Chronic obstructive pulmonary disease, unspecified: Secondary | ICD-10-CM

## 2020-12-11 DIAGNOSIS — I499 Cardiac arrhythmia, unspecified: Secondary | ICD-10-CM

## 2020-12-11 DIAGNOSIS — D649 Anemia, unspecified: Secondary | ICD-10-CM | POA: Diagnosis not present

## 2020-12-11 DIAGNOSIS — E785 Hyperlipidemia, unspecified: Secondary | ICD-10-CM | POA: Diagnosis not present

## 2020-12-11 DIAGNOSIS — R7303 Prediabetes: Secondary | ICD-10-CM

## 2020-12-11 DIAGNOSIS — I10 Essential (primary) hypertension: Secondary | ICD-10-CM

## 2020-12-11 NOTE — Progress Notes (Signed)
Patient ID: Todd Hutchinson, male    DOB: 23-Feb-1951, 70 y.o.   MRN: [id number]  PCP: Delsa Grana, PA-C  I connected with  Todd Hutchinson  on 12/11/20 at  1:20 PM EDT by a video enabled telemedicine application and verified that I am speaking with the correct person using two identifiers.  I discussed the limitations of evaluation and management by telemedicine and the availability of in person appointments. The patient expressed understanding and agreed to proceed. Staff also discussed with the patient that there may be a patient responsible charge related to this service. Patient Location: home Provider Location: [facility] office Additional Individuals present: none   Chief Complaint  Patient presents with  . Hypertension  . COPD  . Hyperlipidemia    Subjective:   Todd Hutchinson is a 70 y.o. male, presents to clinic with CC of the following:  HPI  F/up on poorly controlled HTN - pt d/c some of his meds due to SE   144/69   Repeat 139/73 - some other readings 120-130's/60-70's He feels much better off of carvedilol he denies any palpitations, rapid heart rate, chest pain, shortness of breath, lower extremity edema, near syncope, orthopnea, PND He has been taking the increased dose of chlorthalidone without any side effects feels that he is tolerating it well  Plan from last office visit 1. Essential hypertension BP elevated, not at goal - pt stopped coreg due to SE Reviewed past meds - discussed with pt - he would like to increase chlorthalidone dose - used to be on 50 mg in the past  He has been on coreg and metoprolol in the past w/o any past HF or arrhythmia dx that he knows of - he had irregular rhythm today - frequent PVC/PACs with his physical exam they seemed to occur every 3-4 beats consistently Worse than old EKG - but pt is asx and there was no aFib - pt will monitor for any symptoms - he would like to just try increasing chlorthalidone and will f/up  in 1-2 months to have his HR/rhythm possibly EKG and BP rechecked  - COMPLETE METABOLIC PANEL WITH GFR  BP Readings from Last 3 Encounters:  10/11/20 (!) 142/78  03/20/20 134/78  01/09/20 (!) 155/74    Pulse -notes that his pulse was 107 this morning - HR was irregular at last OV and pt has not tolerated BB.carvedilol - he denies any sx related to HR or rhythm  HLD- Lab Results  Component Value Date   CHOL 177 10/11/2020   HDL 70 10/11/2020   LDLCALC 89 10/11/2020   TRIG 87 10/11/2020   CHOLHDL 2.5 10/11/2020   On pravastatin -good med compliance, labs reviewed from recent office visit, well-controlled, no concerns or side effects  Mild anemia- Hemoglobin  Date Value Ref Range Status  10/11/2020 12.1 (L) 13.2 - 17.1 g/dL Final  04/15/2017 12.6 (L) 13.2 - 17.1 g/dL Final  04/18/2015 10.8 (L) 12.6 - 17.7 g/dL Final   HGB  Date Value Ref Range Status  11/03/2013 12.2 (L) 13.0 - 18.0 g/dL Final  Patient did do home stool cards x3 which were negative he denies any melena or hematochezia hemoglobin has been stable  COPD- Respiratory symptoms are baseline, no change to exertional dyspnea not using inhalers    Patient Active Problem List   Diagnosis Date Noted  . Anemia 10/11/2020  . Irregular heart rhythm 10/11/2020  . Class 2 severe obesity with serious comorbidity and body mass  index (BMI) of 38.0 to 38.9 in adult [facility]) 11/02/2018  . History of colonic polyps   . Benign neoplasm of ascending colon   . Benign neoplasm of cecum   . Hypertensive nephrosclerosis 01/27/2018  . Prediabetes 06/30/2017  . COPD (chronic obstructive pulmonary disease) ([place]) 04/15/2017  . Hx of tobacco use, presenting hazards to health 04/17/2016  . Hyperlipidemia 03/13/2016  . Benign essential hypertension 07/10/2015  . Stage 3 chronic kidney disease ([place]) 07/10/2015  . Cystitis, radiation 05/31/2012  . Hematuria, microscopic 05/31/2012  . ED (erectile dysfunction) of organic origin 05/31/2012   . Hx of malignant neoplasm of prostate 05/31/2012      Current Outpatient Medications:  .  amLODipine-benazepril (LOTREL) 10-40 MG capsule, TAKE 1 CAPSULE DAILY., Disp: 90 capsule, Rfl: 3 .  calcium citrate-vitamin D (CITRACAL+D) 315-200 MG-UNIT tablet, Take 1 tablet by mouth daily., Disp: , Rfl:  .  chlorthalidone (HYGROTON) 50 MG tablet, Take 1 tablet (50 mg total) by mouth daily., Disp: 90 tablet, Rfl: 1 .  Cholecalciferol (VITAMIN D-3) 1000 units CAPS, Take 1 capsule (1,000 Units total) by mouth daily., Disp: , Rfl:  .  Omega-3 Fatty Acids (FISH OIL) 1000 MG CAPS, Take 1,000 mg by mouth daily., Disp: , Rfl:  .  pravastatin (PRAVACHOL) 40 MG tablet, Take 1 tablet (40 mg total) by mouth at bedtime., Disp: 90 tablet, Rfl: 1   Allergies  Allergen Reactions  . Atorvastatin Rash     Social History   Tobacco Use  . Smoking status: Former Smoker    Packs/day: 1.00    Years: 40.00    Pack years: 40.00    Quit date: 11/2013    Years since quitting: 7.0  . Smokeless tobacco: Never Used  Vaping Use  . Vaping Use: Never used  Substance Use Topics  . Alcohol use: Yes    Alcohol/week: 16.0 standard drinks    Types: 16 Shots of liquor per week    Comment: no more than 4 whiskey drinks a day; maybe 4 times day   . Drug use: No      Chart Review Today: I personally reviewed active problem list, medication list, allergies, family history, social history, health maintenance, notes from last encounter, lab results, imaging with the patient/caregiver today.   Review of Systems 10 Systems reviewed and are negative for acute change except as noted in the HPI.     Objective:   There were no vitals filed for this visit.  There is no height or weight on file to calculate BMI.  Physical Exam Vitals and nursing note reviewed.  Constitutional:      General: He is not in acute distress.    Appearance: He is obese. He is not ill-appearing, toxic-appearing or diaphoretic.  HENT:      Head: Normocephalic and atraumatic.  Pulmonary:     Effort: No respiratory distress.  Neurological:     Mental Status: He is alert.  Psychiatric:        Mood and Affect: Mood normal.        Behavior: Behavior normal.    Exam limited by virtual (video) encounter  Results for orders placed or performed in visit on 10/23/20  POCT Occult Blood Stool  Result Value Ref Range   Fecal Occult Blood, POC Negative Negative   Card #1 Date     Card #2 Fecal Occult Blod, POC Negative    Card #2 Date     Card #3 Fecal Occult Blood, POC Negative  Card #3 Date         Assessment & Plan:     ICD-10-CM   1. Essential hypertension  I10    Blood pressure sounds well controlled with his home readings -continue chlorthalidone 50 mg, benazepril and amlodipine close follow-up  2. Anemia, unspecified type  D64.9    Stable, negative Hemoccult x3, patient denies any symptoms we will continue to monitor  3. Hyperlipidemia, unspecified hyperlipidemia type  E78.5   4. Class 2 severe obesity with serious comorbidity and body mass index (BMI) of 38.0 to 38.9 in adult, unspecified obesity type (HCC)  E66.01    Z68.38    No changes to weight/obesity multiple comorbidities prediabetes hyperlipidemia hypertension -encouraged improved diet and increased physical activity  5. Prediabetes  R73.03    Last A1c reviewed 5.8 encouraged him to continue diet and lifestyle efforts  6. Irregular heart rhythm  I49.9    denies any sx, did not come into clinic for Nurse visit/VS, HR elevated this am - pt instructed to come in with any sx or with HR >100   7. Chronic obstructive pulmonary disease, unspecified COPD type ([place])  J44.9    denies any change to sx   Pt need in office routine f/up appt due end of July 2022 - office to call and schedule with pt   I discussed the assessment and treatment plan with the patient. The patient was provided an opportunity to ask questions and all were answered. The patient agreed with  the plan and demonstrated an understanding of the instructions.  The patient was advised to call back or seek an in-person evaluation if the symptoms described above reoccur or worsen or if the condition fails to improve as anticipated.  I provided 30+ minutes of non-face-to-face time during this encounter.  Delsa Grana, PA-C 12/11/20 2:03 PM   Delsa Grana, PA-C 12/11/20 1:42 PM

## 2020-12-11 NOTE — Progress Notes (Deleted)
Name: Todd Hutchinson   MRN: [id number]    DOB: 01-Jul-1951   Date:12/11/2020       Progress Note  Subjective:    I connected with  Todd Hutchinson  on 12/11/20 at  1:20 PM EDT by a video enabled telemedicine application and verified that I am speaking with the correct person using two identifiers.  I discussed the limitations of evaluation and management by telemedicine and the availability of in person appointments. The patient expressed understanding and agreed to proceed. Staff also discussed with the patient that there may be a patient responsible charge related to this service. Patient Location: *** Provider Location: *** Additional Individuals present: ***  Chief Complaint  Patient presents with  . Hypertension  . COPD  . Hyperlipidemia    Todd Hutchinson is a 70 y.o. male, presents for virtual visit for routine follow up on the conditions listed above.  ***  Patient Active Problem List   Diagnosis Date Noted  . Anemia 10/11/2020  . Irregular heart rhythm 10/11/2020  . Class 2 severe obesity with serious comorbidity and body mass index (BMI) of 38.0 to 38.9 in adult ([place]) 11/02/2018  . History of colonic polyps   . Benign neoplasm of ascending colon   . Benign neoplasm of cecum   . Hypertensive nephrosclerosis 01/27/2018  . Prediabetes 06/30/2017  . COPD (chronic obstructive pulmonary disease) ([place]) 04/15/2017  . Hx of tobacco use, presenting hazards to health 04/17/2016  . Hyperlipidemia 03/13/2016  . Benign essential hypertension 07/10/2015  . Stage 3 chronic kidney disease ([place]) 07/10/2015  . Cystitis, radiation 05/31/2012  . Hematuria, microscopic 05/31/2012  . ED (erectile dysfunction) of organic origin 05/31/2012  . Hx of malignant neoplasm of prostate 05/31/2012    Current Outpatient Medications:  .  amLODipine-benazepril (LOTREL) 10-40 MG capsule, TAKE 1 CAPSULE DAILY., Disp: 90 capsule, Rfl: 3 .  calcium citrate-vitamin D (CITRACAL+D)  315-200 MG-UNIT tablet, Take 1 tablet by mouth daily., Disp: , Rfl:  .  chlorthalidone (HYGROTON) 50 MG tablet, Take 1 tablet (50 mg total) by mouth daily., Disp: 90 tablet, Rfl: 1 .  Cholecalciferol (VITAMIN D-3) 1000 units CAPS, Take 1 capsule (1,000 Units total) by mouth daily., Disp: , Rfl:  .  Omega-3 Fatty Acids (FISH OIL) 1000 MG CAPS, Take 1,000 mg by mouth daily., Disp: , Rfl:  .  pravastatin (PRAVACHOL) 40 MG tablet, Take 1 tablet (40 mg total) by mouth at bedtime., Disp: 90 tablet, Rfl: 1 Allergies  Allergen Reactions  . Atorvastatin Rash    Past Surgical History:  Procedure Laterality Date  . COLONOSCOPY WITH PROPOFOL N/A 07/29/2018   Procedure: COLONOSCOPY WITH BIOPSIES;  Surgeon: Lucilla Lame, MD;  Location: [facility];  Service: Endoscopy;  Laterality: N/A;  . POLYPECTOMY N/A 07/29/2018   Procedure: POLYPECTOMY;  Surgeon: Lucilla Lame, MD;  Location: [facility];  Service: Endoscopy;  Laterality: N/A;  . PROSTATE SURGERY     Family History  Problem Relation Age of Onset  . Hypertension Mother   . Aneurysm Mother 93       brain  . Hypertension Father   . Heart attack Father   . Hypertension Sister   . Hypertension Brother   . Heart failure Brother   . Heart disease Paternal Grandfather   . Hypertension Sister   . Hypertension Brother   . Cancer Brother        throat cancer  . Kidney cancer Neg Hx   . Kidney failure  Neg Hx   . Prostate cancer Neg Hx   . Sickle cell anemia Neg Hx   . Tuberculosis Neg Hx    Social History   Socioeconomic History  . Marital status: Married    Spouse name: Todd Hutchinson   . Number of children: 1  . Years of education: Not on file  . Highest education level: 12th grade  Occupational History    Comment: Retired   Tobacco Use  . Smoking status: Former Smoker    Packs/day: 1.00    Years: 40.00    Pack years: 40.00    Quit date: 11/2013    Years since quitting: 7.0  . Smokeless tobacco: Never Used  Vaping Use   . Vaping Use: Never used  Substance and Sexual Activity  . Alcohol use: Yes    Alcohol/week: 16.0 standard drinks    Types: 16 Shots of liquor per week    Comment: no more than 4 whiskey drinks a day; maybe 4 times day   . Drug use: No  . Sexual activity: Never    Partners: Female  Other Topics Concern  . Not on file  Social History Narrative  . Not on file   Social Determinants of Health   Financial Resource Strain: Not on file  Food Insecurity: Not on file  Transportation Needs: No Transportation Needs  . Lack of Transportation (Medical): No  . Lack of Transportation (Non-Medical): No  Physical Activity: Not on file  Stress: Not on file  Social Connections: Not on file  Intimate Partner Violence: Not on file    Chart Review Today: ***  Review of Systems    Objective:    Virtual encounter, vitals limited, only able to obtain the following There were no vitals filed for this visit. There is no height or weight on file to calculate BMI. Nursing Note and Vital Signs reviewed.  Physical Exam  PE limited by telephone encounter  No results found for this or any previous visit (from the past 72 hour(s)).  PHQ2/9: Depression screen [facility] 2/9 12/11/2020 10/11/2020 04/02/2020 03/20/2020 11/16/2019  Decreased Interest 0 0 0 0 0  Down, Depressed, Hopeless 0 0 0 0 0  PHQ - 2 Score 0 0 0 0 0  Altered sleeping - 0 - 1 0  Tired, decreased energy - 0 - 0 0  Change in appetite - 0 - 0 0  Feeling bad or failure about yourself  - 0 - 0 0  Trouble concentrating - 0 - 0 0  Moving slowly or fidgety/restless - 0 - 0 0  Suicidal thoughts - 0 - 0 0  PHQ-9 Score - 0 - 1 0  Difficult doing work/chores - Not difficult at all - Not difficult at all Not difficult at all  Some recent data might be hidden   PHQ-2/9 Result is ***  Fall Risk: Fall Risk  12/11/2020 10/11/2020 04/02/2020 03/20/2020 11/16/2019  Falls in the past year? 0 0 0 0 0  Number falls in past yr: 0 0 - 0 0  Injury with Fall?  0 0 - 0 0  Risk for fall due to : - - - - -  Follow up Falls evaluation completed - - Falls evaluation completed -     Assessment and Plan:     ICD-10-CM   1. Essential hypertension  I10    Blood pressure sounds well controlled with his home readings -continue chlorthalidone 50 mg, benazepril and amlodipine close follow-up  2. Anemia, unspecified type  D64.9    Stable, negative Hemoccult x3, patient denies any symptoms we will continue to monitor  3. Hyperlipidemia, unspecified hyperlipidemia type  E78.5   4. Class 2 severe obesity with serious comorbidity and body mass index (BMI) of 38.0 to 38.9 in adult, unspecified obesity type (HCC)  E66.01    Z68.38    No changes to weight/obesity multiple comorbidities prediabetes hyperlipidemia hypertension -encouraged improved diet and increased physical activity  5. Prediabetes  R73.03    Last A1c reviewed 5.8 encouraged him to continue diet and lifestyle efforts  6. Irregular heart rhythm  I49.9    denies any sx, did not come into clinic for Nurse visit/VS, HR elevated this am - pt instructed to come in with any sx or with HR >100   7. Chronic obstructive pulmonary disease, unspecified COPD type ([place])  J44.9    denies any change to sx    I discussed the assessment and treatment plan with the patient. The patient was provided an opportunity to ask questions and all were answered. The patient agreed with the plan and demonstrated an understanding of the instructions.  The patient was advised to call back or seek an in-person evaluation if the symptoms worsen or if the condition fails to improve as anticipated.  I provided *** minutes of non-face-to-face time during this encounter.  Delsa Grana, PA-C 12/11/20 2:03 PM

## 2020-12-31 ENCOUNTER — Other Ambulatory Visit: Payer: Self-pay | Admitting: Family Medicine

## 2020-12-31 DIAGNOSIS — I10 Essential (primary) hypertension: Secondary | ICD-10-CM

## 2021-01-16 ENCOUNTER — Other Ambulatory Visit: Payer: Medicare HMO

## 2021-01-17 ENCOUNTER — Other Ambulatory Visit: Payer: Self-pay

## 2021-01-17 ENCOUNTER — Other Ambulatory Visit: Payer: Medicare HMO

## 2021-01-17 DIAGNOSIS — C61 Malignant neoplasm of prostate: Secondary | ICD-10-CM | POA: Diagnosis not present

## 2021-01-18 LAB — PSA: Prostate Specific Ag, Serum: 0.5 ng/mL (ref 0.0–4.0)

## 2021-01-21 ENCOUNTER — Other Ambulatory Visit: Payer: Self-pay

## 2021-01-21 ENCOUNTER — Telehealth (INDEPENDENT_AMBULATORY_CARE_PROVIDER_SITE_OTHER): Payer: Medicare HMO | Admitting: Urology

## 2021-01-21 DIAGNOSIS — C61 Malignant neoplasm of prostate: Secondary | ICD-10-CM

## 2021-01-21 NOTE — Progress Notes (Signed)
Virtual Visit via Telephone Note  I connected with Todd Hutchinson on 01/21/21 at  9:45 AM EDT by telephone and verified that I am speaking with the correct person using two identifiers.   Patient location: Home Provider location: [facility]   I discussed the limitations, risks, security and privacy concerns of performing an evaluation and management service by telephone and the availability of in person appointments. We discussed the impact of the COVID-19 pandemic on the healthcare system, and the importance of social distancing and reducing patient and provider exposure. I also discussed with the patient that there may be a patient responsible charge related to this service. The patient expressed understanding and agreed to proceed.  Reason for visit: History of prostate cancer  History of Present Illness: Briefly,Mr. Burnetteis a 70 year old African-American malepreviously followed by Todd Hutchinson history of open radical prostatectomy in 2000with final pathology showing Gleason score 3+4=7 with negative margins and no extraprostatic extension, salvage radiation in 2036forreportedbiochemical recurrence, and long-term Lupron androgen deprivation therapy. His PSA trend since 2014 has remained essentially undetectableon Lupron from 0-0.2.  At our visit in January 2020 his PSA remained stable at 0.2 from 0.2 after being off of Lupron for 9 months when he transitioned from Todd Hutchinson to our practice at [facility]. We discussed the risks and benefits of stopping Lupron and continuing to monitor the PSA, and he agreed to stop Lupron. His repeat PSA on 07/12/2019 was stable at 0.2 from 0.2 previously.  He overall is doing well and denies any complaints of weight loss or bone pain.  Most recent PSA increased slightly to 0.5 from from 0.2  1 year ago.  We discussed possible etiologies at length including prostate cancer recurrence.  I recommended repeating the PSA in 6  months, calling with those results.  If PSA continued to rise would recommend referral to oncology for consideration of observation versus alternative therapies including ADT or antiandrogens.   Follow Up: PSA in 3 months, call with results   I discussed the assessment and treatment plan with the patient. The patient was provided an opportunity to ask questions and all were answered. The patient agreed with the plan and demonstrated an understanding of the instructions.   The patient was advised to call back or seek an in-person evaluation if the symptoms worsen or if the condition fails to improve as anticipated.  I provided 8 minutes of non-face-to-face time during this encounter.   Todd Co, MD

## 2021-03-05 DIAGNOSIS — G4733 Obstructive sleep apnea (adult) (pediatric): Secondary | ICD-10-CM | POA: Diagnosis not present

## 2021-04-05 ENCOUNTER — Other Ambulatory Visit: Payer: Self-pay | Admitting: Family Medicine

## 2021-04-05 DIAGNOSIS — I10 Essential (primary) hypertension: Secondary | ICD-10-CM

## 2021-04-12 ENCOUNTER — Ambulatory Visit: Payer: Medicare HMO | Admitting: Family Medicine

## 2021-04-23 ENCOUNTER — Other Ambulatory Visit: Payer: Medicare HMO

## 2021-04-23 ENCOUNTER — Other Ambulatory Visit: Payer: Self-pay

## 2021-04-23 DIAGNOSIS — C61 Malignant neoplasm of prostate: Secondary | ICD-10-CM

## 2021-04-24 ENCOUNTER — Telehealth: Payer: Self-pay

## 2021-04-24 DIAGNOSIS — C61 Malignant neoplasm of prostate: Secondary | ICD-10-CM

## 2021-04-24 LAB — PSA: Prostate Specific Ag, Serum: 0.6 ng/mL (ref 0.0–4.0)

## 2021-04-24 NOTE — Telephone Encounter (Signed)
Called pt informed him of the information below. Pt declines referral at this time. He elects for surveillance. 14mofollow up scheduled. PSA ordered.

## 2021-04-24 NOTE — Telephone Encounter (Signed)
-----   Message from Billey Co, MD sent at 04/24/2021  9:39 AM EDT ----- PSA remains very low at 0.6 from 0.5, but would recommend referral to oncology to discuss any other alternative options, but observation is very reasonable.  Please place referral to medical oncology for history of prostate cancer, and schedule follow-up with me in 6 months with PSA prior  Nickolas Madrid, MD 04/24/2021

## 2021-04-26 ENCOUNTER — Ambulatory Visit (INDEPENDENT_AMBULATORY_CARE_PROVIDER_SITE_OTHER): Payer: Medicare HMO | Admitting: Family Medicine

## 2021-04-26 ENCOUNTER — Other Ambulatory Visit: Payer: Self-pay

## 2021-04-26 ENCOUNTER — Encounter: Payer: Self-pay | Admitting: Family Medicine

## 2021-04-26 VITALS — BP 142/76 | HR 115 | Temp 99.1°F | Resp 16 | Ht 67.0 in | Wt 233.8 lb

## 2021-04-26 DIAGNOSIS — R7303 Prediabetes: Secondary | ICD-10-CM | POA: Diagnosis not present

## 2021-04-26 DIAGNOSIS — E785 Hyperlipidemia, unspecified: Secondary | ICD-10-CM | POA: Diagnosis not present

## 2021-04-26 DIAGNOSIS — J449 Chronic obstructive pulmonary disease, unspecified: Secondary | ICD-10-CM

## 2021-04-26 DIAGNOSIS — E559 Vitamin D deficiency, unspecified: Secondary | ICD-10-CM | POA: Diagnosis not present

## 2021-04-26 DIAGNOSIS — I491 Atrial premature depolarization: Secondary | ICD-10-CM | POA: Diagnosis not present

## 2021-04-26 DIAGNOSIS — I10 Essential (primary) hypertension: Secondary | ICD-10-CM | POA: Diagnosis not present

## 2021-04-26 DIAGNOSIS — N1831 Chronic kidney disease, stage 3a: Secondary | ICD-10-CM | POA: Diagnosis not present

## 2021-04-26 DIAGNOSIS — D649 Anemia, unspecified: Secondary | ICD-10-CM

## 2021-04-26 DIAGNOSIS — R000 Tachycardia, unspecified: Secondary | ICD-10-CM | POA: Diagnosis not present

## 2021-04-26 DIAGNOSIS — I499 Cardiac arrhythmia, unspecified: Secondary | ICD-10-CM | POA: Diagnosis not present

## 2021-04-26 NOTE — Progress Notes (Signed)
Name: Todd Hutchinson   MRN: [id number]    DOB: 08/07/1951   Date:04/26/2021       Progress Note  Chief Complaint  Patient presents with   Hypertension   Hyperlipidemia    6 month follow up     Subjective:   Todd Hutchinson is a 70 y.o. male, presents to clinic for routine f/up  F/up on poorly controlled HTN - pt d/c some of his meds due to SE last year   Plan from last office visit 1. Essential hypertension BP elevated, not at goal - pt stopped coreg due to SE Reviewed past meds - discussed with pt - he would like to increase chlorthalidone dose - used to be on 50 mg in the past  He has been on coreg and metoprolol in the past w/o any past HF or arrhythmia dx that he knows of - he had irregular rhythm today - frequent PVC/PACs with his physical exam they seemed to occur every 3-4 beats consistently Worse than old EKG - but pt is asx and there was no aFib - pt will monitor for any symptoms - he would like to just try increasing chlorthalidone and will f/up in 1-2 months to have his HR/rhythm possibly EKG and BP rechecked   Today pt states he is anxious Pulse Readings from Last 3 Encounters:  04/26/21 (!) 115  10/11/20 99  03/20/20 88  Heart rate as high as 130 and low today was only 106 He is asymptomatic denies any palpitations chest pain shortness of breath, near syncope He continues to use amlodipine-benazepril and increase chlorthalidone dose to 50 but his blood pressure is still elevated BP Readings from Last 3 Encounters:  04/26/21 (!) 142/76  10/11/20 (!) 142/78  03/20/20 134/78   He reports in the past with carvedilol even with taking much lower doses that he had severe abdominal pain and cramping He does not remember why he stopped metoprolol in the past   HLD- On pravastatin -good med compliance, labs reviewed from recent office visit, well-controlled, no concerns or side effects Lab Results  Component Value Date   CHOL 177 10/11/2020   HDL 70  10/11/2020   LDLCALC 89 10/11/2020   TRIG 87 10/11/2020   CHOLHDL 2.5 10/11/2020      Mild anemia-recheck, denies any melena or hematochezia, previous stool cards were negative Hemoglobin  Date Value Ref Range Status  10/11/2020 12.1 (L) 13.2 - 17.1 g/dL Final  04/15/2017 12.6 (L) 13.2 - 17.1 g/dL Final  04/18/2015 10.8 (L) 12.6 - 17.7 g/dL Final   HGB  Date Value Ref Range Status  11/03/2013 12.2 (L) 13.0 - 18.0 g/dL Final   Patient did do home stool cards x3 which were negative he denies any melena or hematochezia hemoglobin has been stable   COPD- Respiratory symptoms are baseline, no change to exertional dyspnea not using inhalers  Prediabetes, not on medication Lab Results  Component Value Date   HGBA1C 5.8 (H) 10/11/2020      Current Outpatient Medications:    amLODipine-benazepril (LOTREL) 10-40 MG capsule, TAKE 1 CAPSULE DAILY, Disp: 90 capsule, Rfl: 1   chlorthalidone (HYGROTON) 50 MG tablet, TAKE 1 TABLET DAILY(DOSE   INCREASE), Disp: 90 tablet, Rfl: 1   Cholecalciferol (VITAMIN D-3) 1000 units CAPS, Take 1 capsule (1,000 Units total) by mouth daily., Disp: , Rfl:    Omega-3 Fatty Acids (FISH OIL) 1000 MG CAPS, Take 1,000 mg by mouth daily., Disp: , Rfl:  pravastatin (PRAVACHOL) 40 MG tablet, TAKE 1 TABLET AT BEDTIME, Disp: 90 tablet, Rfl: 1   calcium citrate-vitamin D (CITRACAL+D) 315-200 MG-UNIT tablet, Take 1 tablet by mouth daily., Disp: , Rfl:   Patient Active Problem List   Diagnosis Date Noted   Anemia 10/11/2020   Irregular heart rhythm 10/11/2020   Class 2 severe obesity with serious comorbidity and body mass index (BMI) of 38.0 to 38.9 in adult [facility]) 11/02/2018   History of colonic polyps    Benign neoplasm of ascending colon    Benign neoplasm of cecum    Hypertensive nephrosclerosis 01/27/2018   Prediabetes 06/30/2017   COPD (chronic obstructive pulmonary disease) ([place]) 04/15/2017   Hx of tobacco use, presenting hazards to health 04/17/2016    Hyperlipidemia 03/13/2016   Benign essential hypertension 07/10/2015   Stage 3 chronic kidney disease ([place]) 07/10/2015   Cystitis, radiation 05/31/2012   Hematuria, microscopic 05/31/2012   ED (erectile dysfunction) of organic origin 05/31/2012   Hx of malignant neoplasm of prostate 05/31/2012    Past Surgical History:  Procedure Laterality Date   COLONOSCOPY WITH PROPOFOL N/A 07/29/2018   Procedure: COLONOSCOPY WITH BIOPSIES;  Surgeon: Lucilla Lame, MD;  Location: [facility];  Service: Endoscopy;  Laterality: N/A;   POLYPECTOMY N/A 07/29/2018   Procedure: POLYPECTOMY;  Surgeon: Lucilla Lame, MD;  Location: [facility];  Service: Endoscopy;  Laterality: N/A;   PROSTATE SURGERY      Family History  Problem Relation Age of Onset   Hypertension Mother    Aneurysm Mother 65       brain   Hypertension Father    Heart attack Father    Hypertension Sister    Hypertension Brother    Heart failure Brother    Heart disease Paternal Grandfather    Hypertension Sister    Hypertension Brother    Cancer Brother        throat cancer   Kidney cancer Neg Hx    Kidney failure Neg Hx    Prostate cancer Neg Hx    Sickle cell anemia Neg Hx    Tuberculosis Neg Hx     Social History   Tobacco Use   Smoking status: Former    Packs/day: 1.00    Years: 40.00    Pack years: 40.00    Types: Cigarettes    Quit date: 11/2013    Years since quitting: 7.4   Smokeless tobacco: Never  Vaping Use   Vaping Use: Never used  Substance Use Topics   Alcohol use: Yes    Alcohol/week: 16.0 standard drinks    Types: 16 Shots of liquor per week    Comment: no more than 4 whiskey drinks a day; maybe 4 times day    Drug use: No     Allergies  Allergen Reactions   Atorvastatin Rash    Health Maintenance  Topic Date Due   INFLUENZA VACCINE  04/15/2021   COVID-19 Vaccine (3 - Moderna risk series) 05/12/2021 (Originally 01/07/2020)   TETANUS/TDAP  05/15/2021 (Originally 12/08/1970)    Zoster Vaccines- Shingrix (1 of 2) 06/14/2021 (Originally 12/08/1970)   COLONOSCOPY (Pts 45-34yr Insurance coverage will need to be confirmed)  07/30/2023   Hepatitis C Screening  Completed   PNA vac Low Risk Adult  Completed   HPV VACCINES  Aged Out    Chart Review Today: I personally reviewed active problem list, medication list, allergies, family history, social history, health maintenance, notes from last encounter, lab results, imaging with the  patient/caregiver today.   Review of Systems  Constitutional: Negative.   HENT: Negative.    Eyes: Negative.   Respiratory: Negative.    Cardiovascular: Negative.   Gastrointestinal: Negative.   Endocrine: Negative.   Genitourinary: Negative.   Musculoskeletal: Negative.   Skin: Negative.   Allergic/Immunologic: Negative.   Neurological: Negative.   Hematological: Negative.   Psychiatric/Behavioral: Negative.    All other systems reviewed and are negative.   Objective:   Vitals:   04/26/21 1117  BP: (!) 142/76  Pulse: (!) 115  Resp: 16  Temp: 99.1 F (37.3 C)  TempSrc: Oral  SpO2: 96%  Weight: 233 lb 12.8 oz (106.1 kg)  Height: '5\' 7"'$  (1.702 m)    Body mass index is 36.62 kg/m.  Physical Exam Vitals and nursing note reviewed.  Constitutional:      General: He is not in acute distress.    Appearance: He is obese. He is not ill-appearing, toxic-appearing or diaphoretic.  HENT:     Head: Normocephalic and atraumatic.     Right Ear: External ear normal.     Left Ear: External ear normal.     Mouth/Throat:     Mouth: Mucous membranes are moist.     Pharynx: Oropharynx is clear.  Eyes:     General:        Right eye: No discharge.        Left eye: No discharge.     Conjunctiva/sclera: Conjunctivae normal.  Cardiovascular:     Rate and Rhythm: Tachycardia present. Rhythm irregular.     Pulses: Normal pulses.     Heart sounds: Normal heart sounds. No murmur heard.   No friction rub. No gallop.  Pulmonary:      Effort: Pulmonary effort is normal. No respiratory distress.     Breath sounds: Normal breath sounds. No wheezing or rales.  Abdominal:     General: Bowel sounds are normal.     Palpations: Abdomen is soft.  Musculoskeletal:     Right lower leg: No edema.     Left lower leg: No edema.  Skin:    General: Skin is warm and dry.     Capillary Refill: Capillary refill takes less than 2 seconds.     Coloration: Skin is not jaundiced or pale.     Findings: No lesion or rash.  Neurological:     Mental Status: He is alert. Mental status is at baseline.     Gait: Gait normal.  Psychiatric:        Attention and Perception: Attention normal.        Mood and Affect: Mood is anxious. Mood is not depressed.        Speech: Speech normal.        Behavior: Behavior is cooperative.    ECG interpretation   Date: 04/26/21  Rate: 102  Rhythm: sinus tachycardia with PAC  QRS Axis: normal  Intervals: normal  ST/T Wave abnormalities: Nonspecific T wave abnormalities in lead III no other ST elevation or depression  Conduction Disutrbances: none  Old EKG Reviewed: No significant changes noted compared to most recent 10/11/20      Assessment & Plan:     ICD-10-CM   1. Essential hypertension  99991111 COMPLETE METABOLIC PANEL WITH GFR    Ambulatory referral to Cardiology   Still not well controlled since patient stopped his carvedilol, no improvement with increasing chlorthalidone to previous higher dose    2. Anemia, unspecified type  D64.9 CBC with Differential/Platelet  Recheck, prior stool cards negative    3. Hyperlipidemia, unspecified hyperlipidemia type  [id number] COMPLETE METABOLIC PANEL WITH GFR    Ambulatory referral to Cardiology   on pravastatin and omega-3 fatty acid supplements, at goal last labs, will repeat next f/up visit    4. Prediabetes  [id number] COMPLETE METABOLIC PANEL WITH GFR    Hemoglobin A1c   recheck, not on meds    5. Chronic obstructive pulmonary disease, unspecified COPD  type ([place])  J44.9 Ambulatory referral to Cardiology   Patient denies any progression of symptoms    6. Irregular heart rhythm  I49.9 EKG 12-Lead    Ambulatory referral to Cardiology   In exam room today HR 130 - 110's then when he layed down to ECG sinus tach with freq pac's    7. Stage 3a chronic kidney disease (HCC)  [id number] COMPLETE METABOLIC PANEL WITH GFR   Blood pressure elevated, recheck kidney function    8. Vitamin D deficiency  [id number] COMPLETE METABOLIC PANEL WITH GFR    VITAMIN D 25 Hydroxy (Vit-D Deficiency, Fractures)   Vitamin D deficiency, has not been checked in several years to recheck    9. Hypercalcemia  0000000 COMPLETE METABOLIC PANEL WITH GFR    VITAMIN D 25 Hydroxy (Vit-D Deficiency, Fractures)   Recheck labs and vitamin D    10. Tachycardia  R00.0 Ambulatory referral to Cardiology   has not tolerated metoprolol or carvedilol     11. PAC (premature atrial contraction)  I49.1 Ambulatory referral to Cardiology   Frequent PACs with tachycardia patient asymptomatic but uncontrolled blood pressure, follow-up with cardiology     Previously tachycardia and frequent PACs and PVCs were worked up with unremarkable thyroid levels, mild anemia without acute blood loss, well-controlled cholesterol, no significant electrolyte abnormalities only very mildly elevated calcium to 10.6 normal kidney function and liver function Patient is visibly anxious but his premature beats and irregular heartbeats are very frequent with associated tachycardia I do feel it warrants a cardiology consult-he may need echo for structural evaluation or Holter monitor  I encouraged him to avoid caffeine, and seek immediate evaluation if he has any near syncope, diaphoresis, chest pain, difficulty breathing  Return in about 6 months (around 10/27/2021) for Routine follow-up.   Delsa Grana, PA-C 04/26/21 11:26 AM

## 2021-04-26 NOTE — Patient Instructions (Addendum)
Follow up with Cardiology on your variable heart rate   Avoid caffeine (gradually decrease amount used daily)  Sinus Tachycardia  Sinus tachycardia is a kind of fast heartbeat. In sinus tachycardia, the heart beats more than 100 times a minute. Sinus tachycardia starts in a part of the heart called the sinus node. Sinus tachycardia may be harmless, or it may be asign of a serious condition. What are the causes? This condition may be caused by: Exercise or exertion. A fever. Pain. Loss of body fluids (dehydration). Severe bleeding (hemorrhage). Anxiety and stress. Certain substances, including: Alcohol. Caffeine. Tobacco and nicotine products. Cold medicines. Illegal drugs. Medical conditions including: Heart disease. An infection. An overactive thyroid (hyperthyroidism). A lack of red blood cells (anemia). What are the signs or symptoms? Symptoms of this condition include: A feeling that the heart is beating quickly (palpitations). Suddenly noticing your heartbeat (cardiac awareness). Dizziness. Tiredness (fatigue). Shortness of breath. Chest pain. Nausea. Fainting. How is this diagnosed? This condition is diagnosed with: A physical exam. Other tests, such as: Blood tests. An electrocardiogram (ECG). This test measures the electrical activity of the heart. Ambulatory cardiac monitor. This records your heartbeats for 24 hours or more. You may be referred to a heart specialist (cardiologist). How is this treated? Treatment for this condition depends on the cause or the underlying condition. Treatment may involve: Treating the underlying condition. Taking new medicines or changing your current medicines as told by your health care provider. Making changes to your diet or lifestyle. Follow these instructions at home: Lifestyle  Do not use any products that contain nicotine or tobacco, such as cigarettes and e-cigarettes. If you need help quitting, ask your health care  provider. Do not use illegal drugs, such as cocaine. Learn relaxation methods to help you when you get stressed or anxious. These include deep breathing. Avoid caffeine or other stimulants.  Alcohol use  Do not drink alcohol if: Your health care provider tells you not to drink. You are pregnant, may be pregnant, or are planning to become pregnant. If you drink alcohol, limit how much you have: 0-1 drink a day for women. 0-2 drinks a day for men. Be aware of how much alcohol is in your drink. In the [place] one drink equals one typical bottle of beer (12 oz), one-half glass of wine (5 oz), or one shot of hard liquor (1 oz).  General instructions Drink enough fluids to keep your urine pale yellow. Take over-the-counter and prescription medicines only as told by your health care provider. Keep all follow-up visits as told by your health care provider. This is important. Contact a health care provider if you have: A fever. Vomiting or diarrhea that does not go away. Get help right away if you: Have pain in your chest, upper arms, jaw, or neck. Become weak or dizzy. Feel faint. Have palpitations that do not go away. Summary In sinus tachycardia, the heart beats more than 100 times a minute. Sinus tachycardia may be harmless, or it may be a sign of a serious condition. Treatment for this condition depends on the cause or the underlying condition. Get help right away if you have pain in your chest, upper arms, jaw, or neck. This information is not intended to replace advice given to you by your health care provider. Make sure you discuss any questions you have with your healthcare provider. Document Revised: 10/21/2017 Document Reviewed: 10/21/2017 Elsevier Patient Education  [place].

## 2021-04-29 NOTE — Addendum Note (Signed)
Addended by: Delsa Grana on: 04/29/2021 05:27 PM   Modules accepted: Orders

## 2021-04-30 NOTE — Progress Notes (Signed)
Patient notified of labs.   

## 2021-05-01 LAB — CBC WITH DIFFERENTIAL/PLATELET
Absolute Monocytes: 614 cells/uL (ref 200–950)
Basophils Absolute: 40 cells/uL (ref 0–200)
Basophils Relative: 0.6 %
Eosinophils Absolute: 172 cells/uL (ref 15–500)
Eosinophils Relative: 2.6 %
HCT: 37.4 % — ABNORMAL LOW (ref 38.5–50.0)
Hemoglobin: 11.9 g/dL — ABNORMAL LOW (ref 13.2–17.1)
Lymphs Abs: 1544 cells/uL (ref 850–3900)
MCH: 27.1 pg (ref 27.0–33.0)
MCHC: 31.8 g/dL — ABNORMAL LOW (ref 32.0–36.0)
MCV: 85.2 fL (ref 80.0–100.0)
MPV: 12.4 fL (ref 7.5–12.5)
Monocytes Relative: 9.3 %
Neutro Abs: 4231 cells/uL (ref 1500–7800)
Neutrophils Relative %: 64.1 %
Platelets: 199 10*3/uL (ref 140–400)
RBC: 4.39 10*6/uL (ref 4.20–5.80)
RDW: 15.8 % — ABNORMAL HIGH (ref 11.0–15.0)
Total Lymphocyte: 23.4 %
WBC: 6.6 10*3/uL (ref 3.8–10.8)

## 2021-05-01 LAB — COMPLETE METABOLIC PANEL WITH GFR
AG Ratio: 1.4 (calc) (ref 1.0–2.5)
ALT: 16 U/L (ref 9–46)
AST: 13 U/L (ref 10–35)
Albumin: 4.4 g/dL (ref 3.6–5.1)
Alkaline phosphatase (APISO): 34 U/L — ABNORMAL LOW (ref 35–144)
BUN/Creatinine Ratio: 19 (calc) (ref 6–22)
BUN: 26 mg/dL — ABNORMAL HIGH (ref 7–25)
CO2: 24 mmol/L (ref 20–32)
Calcium: 10.7 mg/dL — ABNORMAL HIGH (ref 8.6–10.3)
Chloride: 101 mmol/L (ref 98–110)
Creat: 1.36 mg/dL — ABNORMAL HIGH (ref 0.70–1.35)
Globulin: 3.1 g/dL (calc) (ref 1.9–3.7)
Glucose, Bld: 91 mg/dL (ref 65–139)
Potassium: 4.1 mmol/L (ref 3.5–5.3)
Sodium: 139 mmol/L (ref 135–146)
Total Bilirubin: 0.5 mg/dL (ref 0.2–1.2)
Total Protein: 7.5 g/dL (ref 6.1–8.1)
eGFR: 56 mL/min/{1.73_m2} — ABNORMAL LOW (ref >=60)

## 2021-05-01 LAB — TEST AUTHORIZATION

## 2021-05-01 LAB — VITAMIN D 25 HYDROXY (VIT D DEFICIENCY, FRACTURES): Vit D, 25-Hydroxy: 29 ng/mL — ABNORMAL LOW (ref 30–100)

## 2021-05-01 LAB — HEMOGLOBIN A1C
Hgb A1c MFr Bld: 5.3 % of total Hgb (ref <5.7)
Mean Plasma Glucose: 105 mg/dL
eAG (mmol/L): 5.8 mmol/L

## 2021-05-01 LAB — IRON,TIBC AND FERRITIN PANEL
%SAT: 30 % (calc) (ref 20–48)
Ferritin: 37 ng/mL (ref 24–380)
Iron: 89 ug/dL (ref 50–180)
TIBC: 299 mcg/dL (calc) (ref 250–425)

## 2021-05-02 ENCOUNTER — Encounter: Payer: Self-pay | Admitting: Cardiology

## 2021-05-02 ENCOUNTER — Other Ambulatory Visit: Payer: Self-pay

## 2021-05-02 ENCOUNTER — Ambulatory Visit: Payer: Medicare HMO | Admitting: Cardiology

## 2021-05-02 VITALS — BP 160/62 | HR 105 | Ht 67.0 in | Wt 235.0 lb

## 2021-05-02 DIAGNOSIS — I10 Essential (primary) hypertension: Secondary | ICD-10-CM | POA: Diagnosis not present

## 2021-05-02 DIAGNOSIS — Z6836 Body mass index (BMI) 36.0-36.9, adult: Secondary | ICD-10-CM

## 2021-05-02 DIAGNOSIS — I491 Atrial premature depolarization: Secondary | ICD-10-CM

## 2021-05-02 MED ORDER — BISOPROLOL FUMARATE 5 MG PO TABS: 5.0000 mg | ORAL_TABLET | Freq: Every day | ORAL | 3 refills | Status: DC

## 2021-05-02 NOTE — Patient Instructions (Signed)
Medication Instructions:   Your physician has recommended you make the following change in your medication:    START taking Bisoprolol 5 MG once a day.  *If you need a refill on your cardiac medications before your next appointment, please call your pharmacy*   Lab Work: None ordered If you have labs (blood work) drawn today and your tests are completely normal, you will receive your results only by: [place] (if you have MyChart) OR A paper copy in the mail If you have any lab test that is abnormal or we need to change your treatment, we will call you to review the results.   Testing/Procedures: None ordered   Follow-Up: At [facility], you and your health needs are our priority.  As part of our continuing mission to provide you with exceptional heart care, we have created designated Provider Care Teams.  These Care Teams include your primary Cardiologist (physician) and Advanced Practice Providers (APPs -  Physician Assistants and Nurse Practitioners) who all work together to provide you with the care you need, when you need it.  We recommend signing up for the patient portal called "MyChart".  Sign up information is provided on this After Visit Summary.  MyChart is used to connect with patients for Virtual Visits (Telemedicine).  Patients are able to view lab/test results, encounter notes, upcoming appointments, etc.  Non-urgent messages can be sent to your provider as well.   To learn more about what you can do with MyChart, go to [email or web address].    Your next appointment:   3 month(s)  The format for your next appointment:   In Person  Provider:   You may see Dr. Garen Lah or one of the following Advanced Practice Providers on your designated Care Team:   Murray Hodgkins, NP Christell Faith, PA-C Marrianne Mood, PA-C Cadence Kathlen Mody, [place]   Other Instructions

## 2021-05-02 NOTE — Progress Notes (Signed)
Cardiology Office Note:    Date:  05/02/2021   ID:  Todd Hutchinson, DOB 1950-12-25, MRN [id number]  PCP:  Todd Grana, PA-C   [facility] Providers Cardiologist:  None     Referring MD: Todd Grana, PA-C   Chief Complaint  Patient presents with   New Patient (Initial Visit)    Referred by PCP for Irregular Heart rhythm and PVC. Meds verbally with patient.     History of Present Illness:    Todd Hutchinson is a 70 y.o. male with a hx of hypertension, hyperlipidemia, COPD, former smoker x40+ years who presents with irregular heartbeats.  Patient saw primary care provider last week for scheduled visit, heart rhythm was noted to be irregular on exam.  EKG showed sinus rhythm with PACs.  Blood pressure has also been difficult to control.  Tried carvedilol in the past causing abdominal discomfort and diarrhea.  He otherwise feels well, denies chest pain or shortness of breath, denies edema.  Takes all his medications as prescribed.  Does not check his blood pressure frequently at home.  Last check was over a month ago.  BP elevated at PCPs office last visit in the 140s.  Past Medical History:  Diagnosis Date   Abnormal weight gain 03/13/2016   COPD (chronic obstructive pulmonary disease) (HCC)    Hx of malignant neoplasm of prostate 05/31/2012   Hx of tobacco use, presenting hazards to health 04/17/2016   Hypercholesterolemia 03/13/2016   Hyperlipidemia    Hypertension    Hypertensive nephrosclerosis 01/27/2018   Prostate cancer ([place])    Sleep apnea    uses CPAP    Past Surgical History:  Procedure Laterality Date   COLONOSCOPY WITH PROPOFOL N/A 07/29/2018   Procedure: COLONOSCOPY WITH BIOPSIES;  Surgeon: Lucilla Lame, MD;  Location: [place];  Service: Endoscopy;  Laterality: N/A;   POLYPECTOMY N/A 07/29/2018   Procedure: POLYPECTOMY;  Surgeon: Lucilla Lame, MD;  Location: [place];  Service: Endoscopy;  Laterality: N/A;   PROSTATE SURGERY       Current Medications: Current Meds  Medication Sig   amLODipine-benazepril (LOTREL) 10-40 MG capsule TAKE 1 CAPSULE DAILY   bisoprolol (ZEBETA) 5 MG tablet Take 1 tablet (5 mg total) by mouth daily.   chlorthalidone (HYGROTON) 50 MG tablet TAKE 1 TABLET DAILY(DOSE   INCREASE)   Cholecalciferol (VITAMIN D-3) 1000 units CAPS Take 1 capsule (1,000 Units total) by mouth daily.   Omega-3 Fatty Acids (FISH OIL) 1000 MG CAPS Take 1,000 mg by mouth daily.   pravastatin (PRAVACHOL) 40 MG tablet TAKE 1 TABLET AT BEDTIME     Allergies:   Atorvastatin   Social History   Socioeconomic History   Marital status: Married    Spouse name: Todd Hutchinson    Number of children: 1   Years of education: Not on file   Highest education level: 12th grade  Occupational History    Comment: Retired   Tobacco Use   Smoking status: Former    Packs/day: 1.00    Years: 40.00    Pack years: 40.00    Types: Cigarettes    Quit date: 11/2013    Years since quitting: 7.4   Smokeless tobacco: Never  Vaping Use   Vaping Use: Never used  Substance and Sexual Activity   Alcohol use: Yes    Alcohol/week: 16.0 standard drinks    Types: 16 Shots of liquor per week    Comment: no more than 4 whiskey drinks a day; maybe  4 times day    Drug use: No   Sexual activity: Never    Partners: Female  Other Topics Concern   Not on file  Social History Narrative   Not on file   Social Determinants of Health   Financial Resource Strain: Not on file  Food Insecurity: Not on file  Transportation Needs: Not on file  Physical Activity: Not on file  Stress: Not on file  Social Connections: Not on file     Family History: The patient's family history includes Aneurysm (age of onset: 4) in his mother; Cancer in his brother; Heart attack in his father; Heart disease in his paternal grandfather; Heart failure in his brother; Hypertension in his brother, brother, father, mother, sister, and sister. There is no history of  Kidney cancer, Kidney failure, Prostate cancer, Sickle cell anemia, or Tuberculosis.  ROS:   Please see the history of present illness.     All other systems reviewed and are negative.  EKGs/Labs/Other Studies Reviewed:    The following studies were reviewed today:   EKG:  EKG is  ordered today.  The ekg ordered today demonstrates sinus tachycardia with PACs.  Recent Labs: 10/11/2020: TSH 1.67 04/26/2021: ALT 16; BUN 26; Creat 1.36; Hemoglobin 11.9; Platelets 199; Potassium 4.1; Sodium 139  Recent Lipid Panel    Component Value Date/Time   CHOL 177 10/11/2020 1236   CHOL 139 04/18/2015 0823   TRIG 87 10/11/2020 1236   HDL 70 10/11/2020 1236   HDL 46 04/18/2015 0823   CHOLHDL 2.5 10/11/2020 1236   VLDL 46 (H) 04/15/2017 0921   LDLCALC 89 10/11/2020 1236     Risk Assessment/Calculations:          Physical Exam:    VS:  BP (!) 160/62 (BP Location: Left Arm, Patient Position: Sitting, Cuff Size: Large)   Pulse (!) 105   Ht '5\' 7"'$  (1.702 m)   Wt 235 lb (106.6 kg)   SpO2 95%   BMI 36.81 kg/m     Wt Readings from Last 3 Encounters:  05/02/21 235 lb (106.6 kg)  04/26/21 233 lb 12.8 oz (106.1 kg)  10/11/20 239 lb (108.4 kg)     GEN:  Well nourished, well developed in no acute distress HEENT: Normal NECK: No JVD; No carotid bruits LYMPHATICS: No lymphadenopathy CARDIAC: RRR, no murmurs, rubs, gallops RESPIRATORY:  Clear to auscultation without rales, wheezing or rhonchi  ABDOMEN: Soft, non-tender, non-distended MUSCULOSKELETAL:  No edema; No deformity  SKIN: Warm and dry NEUROLOGIC:  Alert and oriented x 3 PSYCHIATRIC:  Normal affect   ASSESSMENT:    1. Primary hypertension   2. PAC (premature atrial contraction)   3. BMI 36.0-36.9,adult    PLAN:    In order of problems listed above:  Hypertension, BP elevated.  Patient states having whitecoat syndrome.  Advised him to check blood pressure frequently at home and keep a log.  Has not tolerated Coreg in the  past.  We will start bisoprolol 5 mg daily.  Continue Lotrel, chlorthalidone.  If he does not tolerate beta-blocker, will consider AV nodal calcium channel blocker such as Cardizem. Frequent PACs, history of COPD likely contributing, patient asymptomatic.  Tachycardic on exam.  No additional cardiac testing indicated.  Bisoprolol as above. Obesity, weight loss, exercise advised.  Follow-up in 3 months     Medication Adjustments/Labs and Tests Ordered: Current medicines are reviewed at length with the patient today.  Concerns regarding medicines are outlined above.  Orders Placed This  Encounter  Procedures   EKG 12-Lead   Meds ordered this encounter  Medications   bisoprolol (ZEBETA) 5 MG tablet    Sig: Take 1 tablet (5 mg total) by mouth daily.    Dispense:  30 tablet    Refill:  3    Patient Instructions  Medication Instructions:   Your physician has recommended you make the following change in your medication:    START taking Bisoprolol 5 MG once a day.  *If you need a refill on your cardiac medications before your next appointment, please call your pharmacy*   Lab Work: None ordered If you have labs (blood work) drawn today and your tests are completely normal, you will receive your results only by: [place] (if you have MyChart) OR A paper copy in the mail If you have any lab test that is abnormal or we need to change your treatment, we will call you to review the results.   Testing/Procedures: None ordered   Follow-Up: At [facility], you and your health needs are our priority.  As part of our continuing mission to provide you with exceptional heart care, we have created designated Provider Care Teams.  These Care Teams include your primary Cardiologist (physician) and Advanced Practice Providers (APPs -  Physician Assistants and Nurse Practitioners) who all work together to provide you with the care you need, when you need it.  We recommend signing up  for the patient portal called "MyChart".  Sign up information is provided on this After Visit Summary.  MyChart is used to connect with patients for Virtual Visits (Telemedicine).  Patients are able to view lab/test results, encounter notes, upcoming appointments, etc.  Non-urgent messages can be sent to your provider as well.   To learn more about what you can do with MyChart, go to [email or web address].    Your next appointment:   3 month(s)  The format for your next appointment:   In Person  Provider:   You may see Dr. Garen Lah or one of the following Advanced Practice Providers on your designated Care Team:   Murray Hodgkins, NP Christell Faith, PA-C Marrianne Mood, PA-C Cadence [place], [place]   Other Instructions    Signed, Kate Sable, MD  05/02/2021 12:40 PM    [place]

## 2021-06-05 DIAGNOSIS — G4733 Obstructive sleep apnea (adult) (pediatric): Secondary | ICD-10-CM | POA: Diagnosis not present

## 2021-06-19 DIAGNOSIS — M79672 Pain in left foot: Secondary | ICD-10-CM | POA: Diagnosis not present

## 2021-07-12 DIAGNOSIS — M1A00X0 Idiopathic chronic gout, unspecified site, without tophus (tophi): Secondary | ICD-10-CM | POA: Diagnosis not present

## 2021-07-12 DIAGNOSIS — M79672 Pain in left foot: Secondary | ICD-10-CM | POA: Diagnosis not present

## 2021-07-29 ENCOUNTER — Other Ambulatory Visit: Payer: Self-pay

## 2021-08-02 ENCOUNTER — Other Ambulatory Visit: Payer: Self-pay

## 2021-08-02 ENCOUNTER — Ambulatory Visit: Payer: Medicare HMO | Admitting: Cardiology

## 2021-08-02 ENCOUNTER — Encounter: Payer: Self-pay | Admitting: Cardiology

## 2021-08-02 VITALS — BP 160/60 | HR 81 | Ht 67.0 in | Wt 240.0 lb

## 2021-08-02 DIAGNOSIS — Z6837 Body mass index (BMI) 37.0-37.9, adult: Secondary | ICD-10-CM | POA: Diagnosis not present

## 2021-08-02 DIAGNOSIS — I10 Essential (primary) hypertension: Secondary | ICD-10-CM

## 2021-08-02 MED ORDER — BISOPROLOL FUMARATE 10 MG PO TABS: 10.0000 mg | ORAL_TABLET | Freq: Every day | ORAL | 1 refills | Status: DC

## 2021-08-02 NOTE — Progress Notes (Signed)
Cardiology Office Note:    Date:  08/02/2021   ID:  Todd Hutchinson, DOB November 03, 1950, MRN [id number]  PCP:  Todd Grana, PA-C   [facility] Providers Cardiologist:  None     Referring MD: Todd Grana, PA-C   Chief Complaint  Patient presents with   OTher    3 month follow up -- Meds reviewed verbally with patient.     History of Present Illness:    Todd Hutchinson is a 70 y.o. male with a hx of hypertension, hyperlipidemia, COPD, former smoker x40+ years who presents for follow-up.    Previously seen due to PACs, he was reassured.  BP was elevated after last visit.  Previously was on carvedilol but did not tolerate due to abdominal discomfort and diarrhea.  Bisoprolol was started.  States having history of whitecoat syndrome.  Checks his blood pressure frequently at home, range has been systolic in the 920F to 007H.  He is tolerating bisoprolol 5 mg daily without any adverse effects.  Feels well, has no other concerns at this time.   Past Medical History:  Diagnosis Date   Abnormal weight gain 03/13/2016   COPD (chronic obstructive pulmonary disease) (HCC)    Hx of malignant neoplasm of prostate 05/31/2012   Hx of tobacco use, presenting hazards to health 04/17/2016   Hypercholesterolemia 03/13/2016   Hyperlipidemia    Hypertension    Hypertensive nephrosclerosis 01/27/2018   Prostate cancer ([place])    Sleep apnea    uses CPAP    Past Surgical History:  Procedure Laterality Date   COLONOSCOPY WITH PROPOFOL N/A 07/29/2018   Procedure: COLONOSCOPY WITH BIOPSIES;  Surgeon: Lucilla Lame, MD;  Location: [place];  Service: Endoscopy;  Laterality: N/A;   POLYPECTOMY N/A 07/29/2018   Procedure: POLYPECTOMY;  Surgeon: Lucilla Lame, MD;  Location: [place];  Service: Endoscopy;  Laterality: N/A;   PROSTATE SURGERY      Current Medications: Current Meds  Medication Sig   amLODipine-benazepril (LOTREL) 10-40 MG capsule TAKE 1 CAPSULE DAILY    chlorthalidone (HYGROTON) 50 MG tablet TAKE 1 TABLET DAILY(DOSE   INCREASE)   Cholecalciferol (VITAMIN D-3) 1000 units CAPS Take 1 capsule (1,000 Units total) by mouth daily.   Omega-3 Fatty Acids (FISH OIL) 1000 MG CAPS Take 1,000 mg by mouth daily.   pravastatin (PRAVACHOL) 40 MG tablet TAKE 1 TABLET AT BEDTIME   [DISCONTINUED] bisoprolol (ZEBETA) 5 MG tablet Take 1 tablet (5 mg total) by mouth daily.     Allergies:   Atorvastatin   Social History   Socioeconomic History   Marital status: Married    Spouse name: Todd Hutchinson    Number of children: 1   Years of education: Not on file   Highest education level: 12th grade  Occupational History    Comment: Retired   Tobacco Use   Smoking status: Former    Packs/day: 1.00    Years: 40.00    Pack years: 40.00    Types: Cigarettes    Quit date: 11/2013    Years since quitting: 7.7   Smokeless tobacco: Never  Vaping Use   Vaping Use: Never used  Substance and Sexual Activity   Alcohol use: Yes    Alcohol/week: 16.0 standard drinks    Types: 16 Shots of liquor per week    Comment: no more than 4 whiskey drinks a day; maybe 4 times day    Drug use: No   Sexual activity: Never    Partners: Female  Other Topics Concern   Not on file  Social History Narrative   Not on file   Social Determinants of Health   Financial Resource Strain: Not on file  Food Insecurity: Not on file  Transportation Needs: Not on file  Physical Activity: Not on file  Stress: Not on file  Social Connections: Not on file     Family History: The patient's family history includes Aneurysm (age of onset: 85) in his mother; Cancer in his brother; Heart attack in his father; Heart disease in his paternal grandfather; Heart failure in his brother; Hypertension in his brother, brother, father, mother, sister, and sister. There is no history of Kidney cancer, Kidney failure, Prostate cancer, Sickle cell anemia, or Tuberculosis.  ROS:   Please see the history of  present illness.     All other systems reviewed and are negative.  EKGs/Labs/Other Studies Reviewed:    The following studies were reviewed today:   EKG:  EKG is  ordered today.  The ekg ordered today demonstrates sinus rhythm with occasional PVCs.  Recent Labs: 10/11/2020: TSH 1.67 04/26/2021: ALT 16; BUN 26; Creat 1.36; Hemoglobin 11.9; Platelets 199; Potassium 4.1; Sodium 139  Recent Lipid Panel    Component Value Date/Time   CHOL 177 10/11/2020 1236   CHOL 139 04/18/2015 0823   TRIG 87 10/11/2020 1236   HDL 70 10/11/2020 1236   HDL 46 04/18/2015 0823   CHOLHDL 2.5 10/11/2020 1236   VLDL 46 (H) 04/15/2017 0921   LDLCALC 89 10/11/2020 1236     Risk Assessment/Calculations:          Physical Exam:    VS:  BP (!) 160/60 (BP Location: Left Arm, Patient Position: Sitting, Cuff Size: Normal)   Pulse 81   Ht 5\' 7"  (1.702 m)   Wt 240 lb (108.9 kg)   SpO2 96%   BMI 37.59 kg/m     Wt Readings from Last 3 Encounters:  08/02/21 240 lb (108.9 kg)  05/02/21 235 lb (106.6 kg)  04/26/21 233 lb 12.8 oz (106.1 kg)     GEN:  Well nourished, well developed in no acute distress HEENT: Normal NECK: No JVD; No carotid bruits LYMPHATICS: No lymphadenopathy CARDIAC: RRR, no murmurs, rubs, gallops RESPIRATORY:  Clear to auscultation without rales, wheezing or rhonchi  ABDOMEN: Soft, non-tender, non-distended MUSCULOSKELETAL:  No edema; No deformity  SKIN: Warm and dry NEUROLOGIC:  Alert and oriented x 3 PSYCHIATRIC:  Normal affect   ASSESSMENT:    1. Primary hypertension   2. BMI 37.0-37.9, adult     PLAN:    In order of problems listed above:  Hypertension, BP elevated.  Has a component of whitecoat syndrome.  BP at home systolic 749S to 496P.  Increase bisoprolol to 10 mg daily, continue chlorthalidone, Lotrel as prescribed.  Obesity, weight loss, exercise recommended.  Follow-up in 3 months     Medication Adjustments/Labs and Tests Ordered: Current medicines  are reviewed at length with the patient today.  Concerns regarding medicines are outlined above.  Orders Placed This Encounter  Procedures   EKG 12-Lead    Meds ordered this encounter  Medications   bisoprolol (ZEBETA) 10 MG tablet    Sig: Take 1 tablet (10 mg total) by mouth daily.    Dispense:  90 tablet    Refill:  1    Dosage increase     Patient Instructions  Medication Instructions:  Your physician has recommended you make the following change in your medication:  INCREASE Bisoprolol to 10 mg daily. An Rx has been sent to your pharmacy.   *If you need a refill on your cardiac medications before your next appointment, please call your pharmacy*   Lab Work: None ordered  If you have labs (blood work) drawn today and your tests are completely normal, you will receive your results only by: [place] (if you have MyChart) OR A paper copy in the mail If you have any lab test that is abnormal or we need to change your treatment, we will call you to review the results.   Testing/Procedures: None ordered   Follow-Up: At [facility], you and your health needs are our priority.  As part of our continuing mission to provide you with exceptional heart care, we have created designated Provider Care Teams.  These Care Teams include your primary Cardiologist (physician) and Advanced Practice Providers (APPs -  Physician Assistants and Nurse Practitioners) who all work together to provide you with the care you need, when you need it.  We recommend signing up for the patient portal called "MyChart".  Sign up information is provided on this After Visit Summary.  MyChart is used to connect with patients for Virtual Visits (Telemedicine).  Patients are able to view lab/test results, encounter notes, upcoming appointments, etc.  Non-urgent messages can be sent to your provider as well.   To learn more about what you can do with MyChart, go to [email or web address].    Your  next appointment:   3 month(s)  The format for your next appointment:   In Person  Provider:   You may see Kate Sable, MD or one of the following Advanced Practice Providers on your designated Care Team:   Murray Hodgkins, NP Christell Faith, PA-C Cadence Kathlen Mody, [place]    Other Instructions N/A   Signed, Kate Sable, MD  08/02/2021 12:34 PM    [place]

## 2021-08-02 NOTE — Patient Instructions (Signed)
Medication Instructions:  Your physician has recommended you make the following change in your medication:   INCREASE Bisoprolol to 10 mg daily. An Rx has been sent to your pharmacy.   *If you need a refill on your cardiac medications before your next appointment, please call your pharmacy*   Lab Work: None ordered  If you have labs (blood work) drawn today and your tests are completely normal, you will receive your results only by: [place] (if you have MyChart) OR A paper copy in the mail If you have any lab test that is abnormal or we need to change your treatment, we will call you to review the results.   Testing/Procedures: None ordered   Follow-Up: At [facility], you and your health needs are our priority.  As part of our continuing mission to provide you with exceptional heart care, we have created designated Provider Care Teams.  These Care Teams include your primary Cardiologist (physician) and Advanced Practice Providers (APPs -  Physician Assistants and Nurse Practitioners) who all work together to provide you with the care you need, when you need it.  We recommend signing up for the patient portal called "MyChart".  Sign up information is provided on this After Visit Summary.  MyChart is used to connect with patients for Virtual Visits (Telemedicine).  Patients are able to view lab/test results, encounter notes, upcoming appointments, etc.  Non-urgent messages can be sent to your provider as well.   To learn more about what you can do with MyChart, go to [email or web address].    Your next appointment:   3 month(s)  The format for your next appointment:   In Person  Provider:   You may see Kate Sable, MD or one of the following Advanced Practice Providers on your designated Care Team:   Murray Hodgkins, NP Christell Faith, PA-C Cadence Kathlen M[place], [place]    Other Instructions N/A

## 2021-08-13 ENCOUNTER — Telehealth: Payer: Self-pay

## 2021-08-13 NOTE — Telephone Encounter (Signed)
Copied from [place] [id number]. Topic: General - Other >> Aug 13, 2021  9:59 AM Yvette Rack wrote: Reason for CRM: Pt requests that Bonnita Nasuti return his call. Cb# [phone]

## 2021-08-14 ENCOUNTER — Other Ambulatory Visit: Payer: Self-pay | Admitting: Nurse Practitioner

## 2021-08-14 DIAGNOSIS — M1A0790 Idiopathic chronic gout, unspecified ankle and foot, without tophus (tophi): Secondary | ICD-10-CM

## 2021-08-14 MED ORDER — ALLOPURINOL 100 MG PO TABS: 100.0000 mg | ORAL_TABLET | Freq: Every day | ORAL | 0 refills | Status: DC

## 2021-08-14 NOTE — Telephone Encounter (Signed)
Seen by Dr.Baker Podiatry and was told he had Gout and was prescribed allopurinol. Patient stated the podiatrist want his primary to give e prescription for medication.   [facility]

## 2021-08-14 NOTE — Telephone Encounter (Signed)
Patient notified

## 2021-08-28 ENCOUNTER — Ambulatory Visit (INDEPENDENT_AMBULATORY_CARE_PROVIDER_SITE_OTHER): Payer: Medicare HMO | Admitting: Family Medicine

## 2021-08-28 ENCOUNTER — Encounter: Payer: Self-pay | Admitting: Family Medicine

## 2021-08-28 VITALS — BP 136/64 | HR 92 | Temp 97.9°F | Resp 16 | Ht 67.0 in | Wt 234.4 lb

## 2021-08-28 DIAGNOSIS — E790 Hyperuricemia without signs of inflammatory arthritis and tophaceous disease: Secondary | ICD-10-CM | POA: Diagnosis not present

## 2021-08-28 DIAGNOSIS — I129 Hypertensive chronic kidney disease with stage 1 through stage 4 chronic kidney disease, or unspecified chronic kidney disease: Secondary | ICD-10-CM | POA: Diagnosis not present

## 2021-08-28 DIAGNOSIS — N1831 Chronic kidney disease, stage 3a: Secondary | ICD-10-CM | POA: Diagnosis not present

## 2021-08-28 DIAGNOSIS — M1A0790 Idiopathic chronic gout, unspecified ankle and foot, without tophus (tophi): Secondary | ICD-10-CM

## 2021-08-28 DIAGNOSIS — E66812 Body mass index (BMI) 38.0-38.9, adult: Secondary | ICD-10-CM

## 2021-08-28 DIAGNOSIS — E785 Hyperlipidemia, unspecified: Secondary | ICD-10-CM | POA: Diagnosis not present

## 2021-08-28 DIAGNOSIS — Z6838 Body mass index (BMI) 38.0-38.9, adult: Secondary | ICD-10-CM

## 2021-08-28 MED ORDER — INDOMETHACIN 25 MG PO CAPS: ORAL_CAPSULE | ORAL | 1 refills | Status: DC

## 2021-08-28 MED ORDER — PREDNISONE 20 MG PO TABS: 40.0000 mg | ORAL_TABLET | Freq: Every day | ORAL | 1 refills | Status: AC | PRN

## 2021-08-28 NOTE — Patient Instructions (Signed)
Low-Purine Eating Plan A low-purine eating plan involves making food choices to limit your intake of purine. Purine is a kind of uric acid. Too much uric acid in your blood can cause certain conditions, such as gout and kidney stones. Eating a low-purinediet can help control these conditions. What are tips for following this plan? Reading food labels Avoid foods with saturated or Trans fat. Check the ingredient list of grains-based foods, such as bread and cereal, to make sure that they contain whole grains. Check the ingredient list of sauces or soups to make sure they do not contain meat or fish. When choosing soft drinks, check the ingredient list to make sure they do not contain high-fructose corn syrup. Shopping  Buy plenty of fresh fruits and vegetables. Avoid buying canned or fresh fish. Buy dairy products labeled as low-fat or nonfat. Avoid buying premade or processed foods. These foods are often high in fat, salt (sodium), and added sugar.  Cooking Use olive oil instead of butter when cooking. Oils like olive oil, canola oil, and sunflower oil contain healthy fats. Meal planning Learn which foods do or do not affect you. If you find out that a food tends to cause your gout symptoms to flare up, avoid eating that food. You can enjoy foods that do not cause problems. If you have any questions about a food item, talk with your dietitian or health care provider. Limit foods high in fat, especially saturated fat. Fat makes it harder for your body to get rid of uric acid. Choose foods that are lower in fat and are lean sources of protein. General guidelines Limit alcohol intake to no more than 1 drink a day for nonpregnant women and 2 drinks a day for men. One drink equals 12 oz of beer, 5 oz of wine, or 1 oz of hard liquor. Alcohol can affect the way your body gets rid of uric acid. Drink plenty of water to keep your urine clear or pale yellow. Fluids can help remove uric acid from your  body. If directed by your health care provider, take a vitamin C supplement. Work with your health care provider and dietitian to develop a plan to achieve or maintain a healthy weight. Losing weight can help reduce uric acid in your blood. What foods are recommended? The items listed may not be a complete list. Talk with your dietitian aboutwhat dietary choices are best for you. Foods low in purines Foods low in purines do not need to be limited. These include: All fruits. All low-purine vegetables, pickles, and olives. Breads, pasta, rice, cornbread, and popcorn. Cake and other baked goods. All dairy foods. Eggs, nuts, and nut butters. Spices and condiments, such as salt, herbs, and vinegar. Plant oils, butter, and margarine. Water, sugar-free soft drinks, tea, coffee, and cocoa. Vegetable-based soups, broths, sauces, and gravies. Foods moderate in purines Foods moderate in purines should be limited to the amounts listed.  cup of asparagus, cauliflower, spinach, mushrooms, or green peas, each day. 2/3 cup uncooked oatmeal, each day.  cup dry wheat bran or wheat germ, each day. 2-3 ounces of meat or poultry, each day. 4-6 ounces of shellfish, such as crab, lobster, oysters, or shrimp, each day. 1 cup cooked beans, peas, or lentils, each day. Soup, broths, or bouillon made from meat or fish. Limit these foods as much as possible. What foods are not recommended? The items listed may not be a complete list. Talk with your dietitian aboutwhat dietary choices are best for you.   Limit your intake of foods high in purines, including: Beer and other alcohol. Meat-based gravy or sauce. Canned or fresh fish, such as: Anchovies, sardines, herring, and tuna. Mussels and scallops. Codfish, trout, and haddock. Bacon. Organ meats, such as: Liver or kidney. Tripe. Sweetbreads (thymus gland or pancreas). Wild game or goose. Yeast or yeast extract supplements. Drinks sweetened with  high-fructose corn syrup. Summary Eating a low-purine diet can help control conditions caused by too much uric acid in the body, such as gout or kidney stones. Choose low-purine foods, limit alcohol, and limit foods high in fat. You will learn over time which foods do or do not affect you. If you find out that a food tends to cause your gout symptoms to flare up, avoid eating that food. This information is not intended to replace advice given to you by your health care provider. Make sure you discuss any questions you have with your healthcare provider. Document Revised: 12/15/2019 Document Reviewed: 12/15/2019 Elsevier Patient Education  2022 [employer]  

## 2021-08-28 NOTE — Progress Notes (Signed)
Name: Todd Hutchinson   MRN: [id number]    DOB: [birth year]   Date:08/28/2021       Progress Note  Chief Complaint  Patient presents with   Follow-up   Gout     Subjective:   Todd Hutchinson is a 71 y.o. male, presents to clinic for gout and high uric acid   HTN - on multiple meds (see med list below) BP Readings from Last 5 Encounters:  08/28/21 136/64  08/02/21 (!) 160/60  05/02/21 (!) 160/62  Bp elevated today previously, better today Denies CP, SOB, HA, vision changes, palpitations, LE edema weight changes  HLD on statin - pravastatin     No results found for: LABURIC    Current Outpatient Medications:    allopurinol (ZYLOPRIM) 100 MG tablet, Take 1 tablet (100 mg total) by mouth daily., Disp: 30 tablet, Rfl: 0   amLODipine-benazepril (LOTREL) 10-40 MG capsule, TAKE 1 CAPSULE DAILY, Disp: 90 capsule, Rfl: 1   bisoprolol (ZEBETA) 10 MG tablet, Take 1 tablet (10 mg total) by mouth daily., Disp: 90 tablet, Rfl: 1   chlorthalidone (HYGROTON) 50 MG tablet, TAKE 1 TABLET DAILY(DOSE   INCREASE), Disp: 90 tablet, Rfl: 1   Cholecalciferol (VITAMIN D-3) 1000 units CAPS, Take 1 capsule (1,000 Units total) by mouth daily., Disp: , Rfl:    Omega-3 Fatty Acids (FISH OIL) 1000 MG CAPS, Take 1,000 mg by mouth daily., Disp: , Rfl:    pravastatin (PRAVACHOL) 40 MG tablet, TAKE 1 TABLET AT BEDTIME, Disp: 90 tablet, Rfl: 1  Patient Active Problem List   Diagnosis Date Noted   Anemia 10/11/2020   Irregular heart rhythm 10/11/2020   Class 2 severe obesity with serious comorbidity and body mass index (BMI) of 38.0 to 38.9 in adult [facility]) 11/02/2018   History of colonic polyps    Benign neoplasm of ascending colon    Benign neoplasm of cecum    Hypertensive nephrosclerosis 01/27/2018   Prediabetes 06/30/2017   COPD (chronic obstructive pulmonary disease) ([place]) 04/15/2017   Hx of tobacco use, presenting hazards to health 04/17/2016   Hyperlipidemia 03/13/2016   Benign  essential hypertension 07/10/2015   Stage 3 chronic kidney disease ([place]) 07/10/2015   Cystitis, radiation 05/31/2012   Hematuria, microscopic 05/31/2012   ED (erectile dysfunction) of organic origin 05/31/2012   Hx of malignant neoplasm of prostate 05/31/2012    Past Surgical History:  Procedure Laterality Date   COLONOSCOPY WITH PROPOFOL N/A 07/29/2018   Procedure: COLONOSCOPY WITH BIOPSIES;  Surgeon: Lucilla Lame, MD;  Location: [place];  Service: Endoscopy;  Laterality: N/A;   POLYPECTOMY N/A 07/29/2018   Procedure: POLYPECTOMY;  Surgeon: Lucilla Lame, MD;  Location: [place];  Service: Endoscopy;  Laterality: N/A;   PROSTATE SURGERY      Family History  Problem Relation Age of Onset   Hypertension Mother    Aneurysm Mother 52       brain   Hypertension Father    Heart attack Father    Hypertension Sister    Hypertension Brother    Heart failure Brother    Heart disease Paternal Grandfather    Hypertension Sister    Hypertension Brother    Cancer Brother        throat cancer   Kidney cancer Neg Hx    Kidney failure Neg Hx    Prostate cancer Neg Hx    Sickle cell anemia Neg Hx    Tuberculosis Neg Hx     Social  History   Tobacco Use   Smoking status: Former    Packs/day: 1.00    Years: 40.00    Pack years: 40.00    Types: Cigarettes    Quit date: 11/2013    Years since quitting: 7.7   Smokeless tobacco: Never  Vaping Use   Vaping Use: Never used  Substance Use Topics   Alcohol use: Yes    Alcohol/week: 16.0 standard drinks    Types: 16 Shots of liquor per week    Comment: no more than 4 whiskey drinks a day; maybe 4 times day    Drug use: No     Allergies  Allergen Reactions   Atorvastatin Rash    Health Maintenance  Topic Date Due   TETANUS/TDAP  Never done   Zoster Vaccines- Shingrix (1 of 2) Never done   COVID-19 Vaccine (3 - Moderna risk series) 01/07/2020   INFLUENZA VACCINE  04/15/2021   COLONOSCOPY (Pts 45-55yrs  Insurance coverage will need to be confirmed)  07/30/2023   Pneumonia Vaccine 81+ Years old  Completed   Hepatitis C Screening  Completed   HPV VACCINES  Aged Out    Chart Review Today: I personally reviewed active problem list, medication list, allergies, family history, social history, health maintenance, notes from last encounter, lab results, imaging with the patient/caregiver today.   Review of Systems  Constitutional: Negative.   HENT: Negative.    Eyes: Negative.   Respiratory: Negative.    Cardiovascular: Negative.   Gastrointestinal: Negative.   Endocrine: Negative.   Genitourinary: Negative.   Musculoskeletal: Negative.   Skin: Negative.   Allergic/Immunologic: Negative.   Neurological: Negative.   Hematological: Negative.   Psychiatric/Behavioral: Negative.    All other systems reviewed and are negative.   Objective:   Vitals:   08/28/21 1500  BP: 136/64  Pulse: 92  Resp: 16  Temp: 97.9 F (36.6 C)  TempSrc: Oral  SpO2: 95%  Weight: 234 lb 6.4 oz (106.3 kg)  Height: 5\' 7"  (1.702 m)    Body mass index is 36.71 kg/m.  Physical Exam Vitals and nursing note reviewed.  Constitutional:      General: He is not in acute distress.    Appearance: He is well-developed. He is obese. He is not ill-appearing, toxic-appearing or diaphoretic.  HENT:     Head: Normocephalic and atraumatic.     Nose: Nose normal.  Eyes:     General:        Right eye: No discharge.        Left eye: No discharge.     Conjunctiva/sclera: Conjunctivae normal.  Neck:     Trachea: No tracheal deviation.  Cardiovascular:     Rate and Rhythm: Normal rate and regular rhythm.     Pulses: Normal pulses.     Heart sounds: Normal heart sounds.  Pulmonary:     Effort: Pulmonary effort is normal. No respiratory distress.     Breath sounds: Normal breath sounds. No stridor.  Musculoskeletal:        General: Normal range of motion.  Skin:    General: Skin is warm and dry.     Findings:  No rash.  Neurological:     Mental Status: He is alert.     Motor: No abnormal muscle tone.     Coordination: Coordination normal.  Psychiatric:        Behavior: Behavior normal.        Assessment & Plan:     ICD-10-CM  1. Chronic gout of foot, unspecified cause, unspecified laterality  M1A.0790 Uric acid    indomethacin (INDOCIN) 25 MG capsule    predniSONE (DELTASONE) 20 MG tablet   check uric acid levels, manage with allopurinol to goal <6.0, cover flare with steroids if needed, titrate meds to uric acid goal    2. Elevated uric acid in blood  E79.0     3. Class 2 severe obesity with serious comorbidity and body mass index (BMI) of 38.0 to 38.9 in adult, unspecified obesity type ([place])  E66.01    Z68.23    with multiple comorbidities HTN, HLD, prediabetes, CKD    4. Stage 3a chronic kidney disease (HCC)  N18.31    monitoring - may limit acute gout flare meds (colchicine and indomethacin) may need to manage with prednisone - monitoring    5. Hyperlipidemia, unspecified hyperlipidemia type  E78.5    on statin, due for recheck next routine f/up appt    6. Hypertensive nephrosclerosis, stage 1 through stage 4 or unspecified chronic kidney disease  I12.9    on multiple meds, BP improved today relative to recent visits        No follow-ups on file.   Delsa Grana, PA-C 08/28/21 2:51 PM

## 2021-08-29 LAB — URIC ACID: Uric Acid, Serum: 8.9 mg/dL — ABNORMAL HIGH (ref 4.0–8.0)

## 2021-09-03 DIAGNOSIS — G4733 Obstructive sleep apnea (adult) (pediatric): Secondary | ICD-10-CM | POA: Diagnosis not present

## 2021-09-05 ENCOUNTER — Other Ambulatory Visit: Payer: Self-pay | Admitting: Nurse Practitioner

## 2021-09-05 DIAGNOSIS — M1A0790 Idiopathic chronic gout, unspecified ankle and foot, without tophus (tophi): Secondary | ICD-10-CM

## 2021-10-02 ENCOUNTER — Other Ambulatory Visit: Payer: Self-pay | Admitting: Family Medicine

## 2021-10-02 DIAGNOSIS — M1A0790 Idiopathic chronic gout, unspecified ankle and foot, without tophus (tophi): Secondary | ICD-10-CM

## 2021-10-10 ENCOUNTER — Other Ambulatory Visit: Payer: Self-pay | Admitting: Family Medicine

## 2021-10-10 DIAGNOSIS — I10 Essential (primary) hypertension: Secondary | ICD-10-CM

## 2021-10-10 NOTE — Telephone Encounter (Signed)
Requested medications are due for refill today.  yes  Requested medications are on the active medications list.  yes  Last refill. 04/05/2021 for all 3   Future visit scheduled.   yes  Notes to clinic.  Failed protocol d/t expired / abnormal labs.    Requested Prescriptions  Pending Prescriptions Disp Refills   amLODipine-benazepril (LOTREL) 10-40 MG capsule [Pharmacy Med Name: AMLOD/BENAZP CAP 10-40MG ] 90 capsule 1    Sig: TAKE 1 CAPSULE DAILY     Cardiovascular: CCB + ACEI Combos Failed - 10/10/2021  6:01 PM      Failed - Cr in normal range and within 180 days    Creat  Date Value Ref Range Status  04/26/2021 1.36 (H) 0.70 - 1.35 mg/dL Final   Creatinine, Urine  Date Value Ref Range Status  07/29/2017 88 20 - 320 mg/dL Final          Passed - K in normal range and within 180 days    Potassium  Date Value Ref Range Status  04/26/2021 4.1 3.5 - 5.3 mmol/L Final          Passed - Patient is not pregnant      Passed - Last BP in normal range    BP Readings from Last 1 Encounters:  08/28/21 136/64          Passed - Valid encounter within last 6 months    Recent Outpatient Visits           1 month ago Chronic gout of foot, unspecified cause, unspecified laterality   [facility] Delsa Grana, PA-C   5 months ago Essential hypertension   [facility] Delsa Grana, PA-C   10 months ago Essential hypertension   [facility] Delsa Grana, PA-C   12 months ago Essential hypertension   [facility] [place], Kristeen Miss, PA-C   1 year ago Essential hypertension   [place] [place], [place]       Future Appointments             In 2 weeks Delsa Grana, PA-C [facility], [place]   In 4 weeks Agbor-Etang, Aaron Edelman, MD [facility], [facility]             pravastatin (PRAVACHOL) 40 MG tablet [Pharmacy Med Name: PRAVASTATIN  TAB 40MG ] 90 tablet  1    Sig: TAKE 1 TABLET AT BEDTIME     Cardiovascular:  Antilipid - Statins Failed - 10/10/2021  6:01 PM      Failed - Total Cholesterol in normal range and within 360 days    Cholesterol, Total  Date Value Ref Range Status  04/18/2015 139 100 - 199 mg/dL Final   Cholesterol  Date Value Ref Range Status  10/11/2020 177 <200 mg/dL Final          Failed - LDL in normal range and within 360 days    LDL Cholesterol (Calc)  Date Value Ref Range Status  10/11/2020 89 mg/dL (calc) Final    Comment:    Reference range: <100 . Desirable range <100 mg/dL for primary prevention;   <70 mg/dL for patients with CHD or diabetic patients  with > or = 2 CHD risk factors. Marland Kitchen LDL-C is now calculated using the Martin-Hopkins  calculation, which is a validated novel method providing  better accuracy than the Friedewald equation in the  estimation of LDL-C.  Cresenciano Genre et al. Annamaria Helling. 4401;027(25): [phone]  (  http://education.[employer].com/faq/FAQ164)           Failed - HDL in normal range and within 360 days    HDL  Date Value Ref Range Status  10/11/2020 70 > OR = 40 mg/dL Final  04/18/2015 46 >39 mg/dL Final    Comment:    According to ATP-III Guidelines, HDL-C >59 mg/dL is considered a negative risk factor for CHD.           Failed - Triglycerides in normal range and within 360 days    Triglycerides  Date Value Ref Range Status  10/11/2020 87 <150 mg/dL Final          Passed - Patient is not pregnant      Passed - Valid encounter within last 12 months    Recent Outpatient Visits           1 month ago Chronic gout of foot, unspecified cause, unspecified laterality   [facility] Delsa Grana, PA-C   5 months ago Essential hypertension   [facility] Delsa Grana, PA-C   10 months ago Essential hypertension   [facility] Delsa Grana, PA-C   12 months ago Essential hypertension   [place] Delsa Grana, PA-C   1 year ago Essential hypertension   [facility] Delsa Grana, PA-C       Future Appointments             In 2 weeks Delsa Grana, PA-C [facility], [place]   In 4 weeks Agbor-Etang, Aaron Edelman, MD [place], [facility]             chlorthalidone (HYGROTON) 50 MG tablet [Pharmacy Med Name: CHLORTHALID  TAB 50MG ] 90 tablet 1    Sig: TAKE 1 TABLET DAILY (DOSE   INCREASE)     Cardiovascular: Diuretics - Thiazide Failed - 10/10/2021  6:01 PM      Failed - Ca in normal range and within 360 days    Calcium  Date Value Ref Range Status  04/26/2021 10.7 (H) 8.6 - 10.3 mg/dL Final          Failed - Cr in normal range and within 360 days    Creat  Date Value Ref Range Status  04/26/2021 1.36 (H) 0.70 - 1.35 mg/dL Final   Creatinine, Urine  Date Value Ref Range Status  07/29/2017 88 20 - 320 mg/dL Final          Passed - K in normal range and within 360 days    Potassium  Date Value Ref Range Status  04/26/2021 4.1 3.5 - 5.3 mmol/L Final          Passed - Na in normal range and within 360 days    Sodium  Date Value Ref Range Status  04/26/2021 139 135 - 146 mmol/L Final  06/18/2015 142 134 - 144 mmol/L Final    Comment:    **Effective July 02, 2015 the reference interval**   for Sodium, Serum will be changing to:                                             136 - 144           Passed - Last BP in normal range    BP Readings from Last 1 Encounters:  08/28/21 136/64  Passed - Valid encounter within last 6 months    Recent Outpatient Visits           1 month ago Chronic gout of foot, unspecified cause, unspecified laterality   [facility] Delsa Grana, PA-C   5 months ago Essential hypertension   [facility] Delsa Grana, PA-C   10 months ago Essential hypertension   [facility] Delsa Grana, [place]   12 months ago  Essential hypertension   [facility] Delsa Grana, PA-C   1 year ago Essential hypertension   [facility] Delsa Grana, PA-C       Future Appointments             In 2 weeks Delsa Grana, PA-C [facility], [place]   In 4 weeks Kate Sable, MD [facility], [place]

## 2021-10-24 ENCOUNTER — Other Ambulatory Visit: Payer: Medicare HMO

## 2021-10-24 ENCOUNTER — Other Ambulatory Visit: Payer: Self-pay

## 2021-10-24 DIAGNOSIS — C61 Malignant neoplasm of prostate: Secondary | ICD-10-CM

## 2021-10-25 ENCOUNTER — Other Ambulatory Visit: Payer: Self-pay | Admitting: Physician Assistant

## 2021-10-25 DIAGNOSIS — M1A0790 Idiopathic chronic gout, unspecified ankle and foot, without tophus (tophi): Secondary | ICD-10-CM

## 2021-10-25 LAB — PSA: Prostate Specific Ag, Serum: 0.4 ng/mL (ref 0.0–4.0)

## 2021-10-25 NOTE — Telephone Encounter (Signed)
Requested Prescriptions  Pending Prescriptions Disp Refills   allopurinol (ZYLOPRIM) 100 MG tablet [Pharmacy Med Name: ALLOPURINOL 100 MG TABLET] 90 tablet 0    Sig: TAKE 1 TABLET BY [place] DAY     Endocrinology:  Gout Agents - allopurinol Failed - 10/25/2021 10:32 AM      Failed - Uric Acid in normal range and within 360 days    Uric Acid, Serum  Date Value Ref Range Status  08/28/2021 8.9 (H) 4.0 - 8.0 mg/dL Final    Comment:    Therapeutic target for gout patients: <6.0 mg/dL .          Failed - Cr in normal range and within 360 days    Creat  Date Value Ref Range Status  04/26/2021 1.36 (H) 0.70 - 1.35 mg/dL Final   Creatinine, Urine  Date Value Ref Range Status  07/29/2017 88 20 - 320 mg/dL Final         Passed - Valid encounter within last 12 months    Recent Outpatient Visits          1 month ago Chronic gout of foot, unspecified cause, unspecified laterality   [facility] Delsa Grana, PA-C   6 months ago Essential hypertension   [facility] Delsa Grana, PA-C   10 months ago Essential hypertension   [facility] Delsa Grana, PA-C   1 year ago Essential hypertension   [facility] Delsa Grana, PA-C   1 year ago Essential hypertension   [facility] Delsa Grana, PA-C      Future Appointments            In 5 days Mecum, Dani Gobble, PA-C [facility], [place]   In 1 week Kate Sable, MD [facility], [facility]           Passed - CBC within normal limits and completed in the last 12 months    WBC  Date Value Ref Range Status  04/26/2021 6.6 3.8 - 10.8 Thousand/uL Final   RBC  Date Value Ref Range Status  04/26/2021 4.39 4.20 - 5.80 Million/uL Final   Hemoglobin  Date Value Ref Range Status  04/26/2021 11.9 (L) 13.2 - 17.1 g/dL Final  04/18/2015 10.8 (L) 12.6 - 17.7 g/dL Final   HCT  Date Value Ref Range Status   04/26/2021 37.4 (L) 38.5 - 50.0 % Final   Hematocrit  Date Value Ref Range Status  04/18/2015 33.0 (L) 37.5 - 51.0 % Final   MCHC  Date Value Ref Range Status  04/26/2021 31.8 (L) 32.0 - 36.0 g/dL Final   [facility]  Date Value Ref Range Status  04/26/2021 27.1 27.0 - 33.0 pg Final   MCV  Date Value Ref Range Status  04/26/2021 85.2 80.0 - 100.0 fL Final  04/18/2015 78 (L) 79 - 97 fL Final  11/03/2013 78 (L) 80 - 100 fL Final   No results found for: PLTCOUNTKUC, LABPLAT, POCPLA RDW  Date Value Ref Range Status  04/26/2021 15.8 (H) 11.0 - 15.0 % Final  04/18/2015 16.1 (H) 12.3 - 15.4 % Final  11/03/2013 18.7 (H) 11.5 - 14.5 % Final

## 2021-10-29 ENCOUNTER — Ambulatory Visit: Payer: Medicare HMO | Admitting: Family Medicine

## 2021-10-29 ENCOUNTER — Telehealth: Payer: Self-pay

## 2021-10-29 DIAGNOSIS — C61 Malignant neoplasm of prostate: Secondary | ICD-10-CM

## 2021-10-29 NOTE — Telephone Encounter (Signed)
-----   Message from Billey Co, MD sent at 10/26/2021 10:06 AM EST ----- Good news, PSA stable and low at 0.4, recommended in person follow-up in 6 months with PSA prior  Nickolas Madrid, MD 10/26/2021

## 2021-10-29 NOTE — Telephone Encounter (Signed)
See my chart encounter.

## 2021-10-30 ENCOUNTER — Encounter: Payer: Self-pay | Admitting: Physician Assistant

## 2021-10-30 ENCOUNTER — Other Ambulatory Visit: Payer: Self-pay

## 2021-10-30 ENCOUNTER — Ambulatory Visit (INDEPENDENT_AMBULATORY_CARE_PROVIDER_SITE_OTHER): Payer: Medicare HMO | Admitting: Physician Assistant

## 2021-10-30 VITALS — BP 144/72 | HR 79 | Temp 98.6°F | Resp 18 | Ht 67.0 in | Wt 238.1 lb

## 2021-10-30 DIAGNOSIS — E785 Hyperlipidemia, unspecified: Secondary | ICD-10-CM | POA: Diagnosis not present

## 2021-10-30 DIAGNOSIS — R7303 Prediabetes: Secondary | ICD-10-CM | POA: Diagnosis not present

## 2021-10-30 DIAGNOSIS — D649 Anemia, unspecified: Secondary | ICD-10-CM

## 2021-10-30 DIAGNOSIS — Z23 Encounter for immunization: Secondary | ICD-10-CM

## 2021-10-30 DIAGNOSIS — M1A0790 Idiopathic chronic gout, unspecified ankle and foot, without tophus (tophi): Secondary | ICD-10-CM

## 2021-10-30 DIAGNOSIS — I10 Essential (primary) hypertension: Secondary | ICD-10-CM | POA: Diagnosis not present

## 2021-10-30 MED ORDER — SHINGRIX 50 MCG/0.5ML IM SUSR: 0.5000 mL | Freq: Once | INTRAMUSCULAR | 1 refills | Status: AC

## 2021-10-30 NOTE — Patient Instructions (Addendum)
Your blood pressure was elevated today.  If possible please take it at home using an electronic blood pressure cuff for the upper arm Record your blood pressure once per day and bring them back with you to your apt so we can make sure you are not developing high blood pressure.   Incorporating a minimum of 150 minutes (20-30 minutes per day) of moderate intensity physical activity can help improve your heart health and reduce the chances of high blood pressure and other cardiovascular risks. Incorporating a heart healthy diet can also help reduce the chances of heart attack and high cholesterol.  Please come back for your lab work after fasting for at least 8 hours.  We will update you with those results once they are available.

## 2021-10-30 NOTE — Assessment & Plan Note (Signed)
Chronic, historic condition, ongoing and stable Repeat lipid panel today for monitoring  Continue current medications  Lab results to dictate further management

## 2021-10-30 NOTE — Assessment & Plan Note (Signed)
Chronic, historic condition Currently managed with Lotrel, bisoprolol, and chlorthalidone  Initial BP in office was elevated but was decreased at end Recommend taking BP at home and bringing records to next apt to discuss if there is a need for further intervention vs isolated elevation in office Continue current medications Recommend increased physical activity to improve heart health. Follow up in 3 months with BP measures.

## 2021-10-30 NOTE — Progress Notes (Signed)
Established Patient Office Visit  Subjective:  Patient ID: Todd Hutchinson, male    DOB: [birth year]  Age: 71 y.o. MRN: [id number]  Today's Provider: Talitha Givens, MHS, PA-C Introduced myself to the patient as a PA-C and provided education on APPs in clinical practice.    CC:  Chief Complaint  Patient presents with   Follow-up   Hypertension   Hyperlipidemia    HPI Todd Hutchinson presents for chronic condition follow up and management    Hypertension: states at home his blood pressure was running in the 130s but he has not checked in a few weeks  Discussed taking BP at home to monitor as it is elevated today.   Diet: Try to eat lean meats, salads, trying to eat a lot of vegetables. Trying to drink more water. Tries to avoid red meat and fried foods. Exercise: states he is not exercising   Hyperlipidemia: discussed previous labs and need for repeat lab work today for monitoring   He is still taking Vitamin D supplement since his last lab work demonstrated low levels  States his foot has been feeling better from gout flare in Dec - would like uric acid levels checked for trending  Past Medical History:  Diagnosis Date   Abnormal weight gain 03/13/2016   COPD (chronic obstructive pulmonary disease) ([place])    Hx of malignant neoplasm of prostate 05/31/2012   Hx of tobacco use, presenting hazards to health 04/17/2016   Hypercholesterolemia 03/13/2016   Hyperlipidemia    Hypertension    Hypertensive nephrosclerosis 01/27/2018   Prostate cancer ([place])    Sleep apnea    uses CPAP    Past Surgical History:  Procedure Laterality Date   COLONOSCOPY WITH PROPOFOL N/A 07/29/2018   Procedure: COLONOSCOPY WITH BIOPSIES;  Surgeon: Lucilla Lame, MD;  Location: [place];  Service: Endoscopy;  Laterality: N/A;   POLYPECTOMY N/A 07/29/2018   Procedure: POLYPECTOMY;  Surgeon: Lucilla Lame, MD;  Location: [facility];  Service: Endoscopy;  Laterality: N/A;    PROSTATE SURGERY      Family History  Problem Relation Age of Onset   Hypertension Mother    Aneurysm Mother 75       brain   Hypertension Father    Heart attack Father    Hypertension Sister    Hypertension Brother    Heart failure Brother    Heart disease Paternal Grandfather    Hypertension Sister    Hypertension Brother    Cancer Brother        throat cancer   Kidney cancer Neg Hx    Kidney failure Neg Hx    Prostate cancer Neg Hx    Sickle cell anemia Neg Hx    Tuberculosis Neg Hx     Social History   Socioeconomic History   Marital status: Married    Spouse name: Dina Rich    Number of children: 1   Years of education: Not on file   Highest education level: 12th grade  Occupational History    Comment: Retired   Tobacco Use   Smoking status: Former    Packs/day: 1.00    Years: 40.00    Pack years: 40.00    Types: Cigarettes    Quit date: 11/2013    Years since quitting: 7.9   Smokeless tobacco: Never  Vaping Use   Vaping Use: Never used  Substance and Sexual Activity   Alcohol use: Yes    Alcohol/week: 16.0 standard drinks  Types: 16 Shots of liquor per week    Comment: no more than 4 whiskey drinks a day; maybe 4 times day    Drug use: No   Sexual activity: Never    Partners: Female  Other Topics Concern   Not on file  Social History Narrative   Not on file   Social Determinants of Health   Financial Resource Strain: Not on file  Food Insecurity: Not on file  Transportation Needs: Not on file  Physical Activity: Not on file  Stress: Not on file  Social Connections: Not on file  Intimate Partner Violence: Not on file    Outpatient Medications Prior to Visit  Medication Sig Dispense Refill   allopurinol (ZYLOPRIM) 100 MG tablet TAKE 1 TABLET BY MOUTH EVERY DAY 90 tablet 0   amLODipine-benazepril (LOTREL) 10-40 MG capsule TAKE 1 CAPSULE DAILY 90 capsule 1   bisoprolol (ZEBETA) 10 MG tablet Take 1 tablet (10 mg total) by mouth daily. 90  tablet 1   chlorthalidone (HYGROTON) 50 MG tablet TAKE 1 TABLET DAILY (DOSE   INCREASE) 90 tablet 1   Cholecalciferol (VITAMIN D-3) 1000 units CAPS Take 1 capsule (1,000 Units total) by mouth daily.     indomethacin (INDOCIN) 25 MG capsule At first sign of pain/gout flare take 50 mg PO TID for 2 to 3 days and then decrease to 25 mg po TID x 2 d 60 capsule 1   Omega-3 Fatty Acids (FISH OIL) 1000 MG CAPS Take 1,000 mg by mouth daily.     pravastatin (PRAVACHOL) 40 MG tablet TAKE 1 TABLET AT BEDTIME 90 tablet 1   No facility-administered medications prior to visit.    Allergies  Allergen Reactions   Atorvastatin Rash    ROS Review of Systems  Constitutional:  Negative for fatigue.  Respiratory:  Negative for chest tightness and shortness of breath.   Cardiovascular:  Negative for chest pain, palpitations and leg swelling.  Musculoskeletal:  Negative for back pain, joint swelling, neck pain and neck stiffness.  Skin:  Negative for rash.  Neurological:  Negative for dizziness, syncope, light-headedness and headaches.     Objective:    Physical Exam Vitals reviewed.  Constitutional:      Appearance: Normal appearance. He is obese.  HENT:     Head: Normocephalic and atraumatic.  Cardiovascular:     Rate and Rhythm: Normal rate and regular rhythm.     Pulses: Normal pulses.     Heart sounds: Normal heart sounds.  Pulmonary:     Effort: Pulmonary effort is normal.     Breath sounds: Normal breath sounds. No wheezing, rhonchi or rales.  Musculoskeletal:     Right lower leg: No edema.     Left lower leg: No edema.  Neurological:     General: No focal deficit present.     Mental Status: He is alert and oriented to person, place, and time.  Psychiatric:        Mood and Affect: Mood normal.        Behavior: Behavior normal.        Thought Content: Thought content normal.        Judgment: Judgment normal.    BP (!) 144/72    Pulse 79    Temp 98.6 F (37 C) (Oral)    Resp 18     Ht '5\' 7"'  (1.702 m)    Wt 238 lb 1.6 oz (108 kg)    SpO2 95%    BMI 37.29  kg/m  Wt Readings from Last 3 Encounters:  10/30/21 238 lb 1.6 oz (108 kg)  08/28/21 234 lb 6.4 oz (106.3 kg)  08/02/21 240 lb (108.9 kg)     Health Maintenance Due  Topic Date Due   Zoster Vaccines- Shingrix (1 of 2) Never done    There are no preventive care reminders to display for this patient.  Lab Results  Component Value Date   TSH 1.67 10/11/2020   Lab Results  Component Value Date   WBC 6.6 04/26/2021   HGB 11.9 (L) 04/26/2021   HCT 37.4 (L) 04/26/2021   MCV 85.2 04/26/2021   PLT 199 04/26/2021   Lab Results  Component Value Date   NA 139 04/26/2021   K 4.1 04/26/2021   CO2 24 04/26/2021   GLUCOSE 91 04/26/2021   BUN 26 (H) 04/26/2021   CREATININE 1.36 (H) 04/26/2021   BILITOT 0.5 04/26/2021   ALKPHOS 36 (L) 04/15/2017   AST 13 04/26/2021   ALT 16 04/26/2021   PROT 7.5 04/26/2021   ALBUMIN 4.4 04/15/2017   CALCIUM 10.7 (H) 04/26/2021   EGFR 56 (L) 04/26/2021   Lab Results  Component Value Date   CHOL 177 10/11/2020   Lab Results  Component Value Date   HDL 70 10/11/2020   Lab Results  Component Value Date   LDLCALC 89 10/11/2020   Lab Results  Component Value Date   TRIG 87 10/11/2020   Lab Results  Component Value Date   CHOLHDL 2.5 10/11/2020   Lab Results  Component Value Date   HGBA1C 5.3 04/26/2021      Assessment & Plan:   Problem List Items Addressed This Visit       Cardiovascular and Mediastinum   Benign essential hypertension - Primary    Chronic, historic condition Currently managed with Lotrel, bisoprolol, and chlorthalidone  Initial BP in office was elevated but was decreased at end Recommend taking BP at home and bringing records to next apt to discuss if there is a need for further intervention vs isolated elevation in office Continue current medications Recommend increased physical activity to improve heart health. Follow up in 3 months  with BP measures.       Relevant Orders   CBC w/Diff/Platelet     Other   Hyperlipidemia    Chronic, historic condition, ongoing and stable Repeat lipid panel today for monitoring  Continue current medications  Lab results to dictate further management       Relevant Orders   Lipid panel   Prediabetes   Relevant Orders   Comprehensive Metabolic Panel (CMET)   Other Visit Diagnoses     Chronic gout of foot, unspecified cause, unspecified laterality       Relevant Orders   Uric acid   Need for shingles vaccine       Relevant Medications   Zoster Vaccine Adjuvanted [facility]) injection       Meds ordered this encounter  Medications   Zoster Vaccine Adjuvanted [facility]) injection    Sig: Inject 0.5 mLs into the muscle once for 1 dose.    Dispense:  0.5 mL    Refill:  1    Follow-up: Return in about 3 months (around 01/27/2022) for HTN.   The entirety of the information documented in the History of Present Illness, Review of Systems and Physical Exam were personally obtained by me. Portions of this information were initially documented by the CMA and reviewed by me for thoroughness and accuracy.  Justyne Roell E Eilis Chestnutt, PA-C  Trella Thurmond E Aisa Schoeppner, PA-C

## 2021-11-04 ENCOUNTER — Ambulatory Visit: Payer: Medicare HMO | Admitting: Cardiology

## 2021-11-05 DIAGNOSIS — M1A0790 Idiopathic chronic gout, unspecified ankle and foot, without tophus (tophi): Secondary | ICD-10-CM | POA: Diagnosis not present

## 2021-11-05 DIAGNOSIS — R7303 Prediabetes: Secondary | ICD-10-CM | POA: Diagnosis not present

## 2021-11-05 DIAGNOSIS — E785 Hyperlipidemia, unspecified: Secondary | ICD-10-CM | POA: Diagnosis not present

## 2021-11-05 DIAGNOSIS — I10 Essential (primary) hypertension: Secondary | ICD-10-CM | POA: Diagnosis not present

## 2021-11-05 LAB — COMPREHENSIVE METABOLIC PANEL
AG Ratio: 1.3 (calc) (ref 1.0–2.5)
ALT: 14 U/L (ref 9–46)
AST: 10 U/L (ref 10–35)
Albumin: 4.1 g/dL (ref 3.6–5.1)
Alkaline phosphatase (APISO): 34 U/L — ABNORMAL LOW (ref 35–144)
BUN: 20 mg/dL (ref 7–25)
CO2: 25 mmol/L (ref 20–32)
Calcium: 9.7 mg/dL (ref 8.6–10.3)
Chloride: 106 mmol/L (ref 98–110)
Creat: 1.2 mg/dL (ref 0.70–1.35)
Globulin: 3.1 g/dL (calc) (ref 1.9–3.7)
Glucose, Bld: 100 mg/dL — ABNORMAL HIGH (ref 65–99)
Potassium: 3.9 mmol/L (ref 3.5–5.3)
Sodium: 142 mmol/L (ref 135–146)
Total Bilirubin: 0.4 mg/dL (ref 0.2–1.2)
Total Protein: 7.2 g/dL (ref 6.1–8.1)

## 2021-11-05 LAB — CBC WITH DIFFERENTIAL/PLATELET
Absolute Monocytes: 730 cells/uL (ref 200–950)
Basophils Absolute: 45 cells/uL (ref 0–200)
Basophils Relative: 0.5 %
Eosinophils Absolute: 169 cells/uL (ref 15–500)
Eosinophils Relative: 1.9 %
HCT: 35.6 % — ABNORMAL LOW (ref 38.5–50.0)
Hemoglobin: 11.1 g/dL — ABNORMAL LOW (ref 13.2–17.1)
Lymphs Abs: 2270 cells/uL (ref 850–3900)
MCH: 26.6 pg — ABNORMAL LOW (ref 27.0–33.0)
MCHC: 31.2 g/dL — ABNORMAL LOW (ref 32.0–36.0)
MCV: 85.2 fL (ref 80.0–100.0)
MPV: 12.8 fL — ABNORMAL HIGH (ref 7.5–12.5)
Monocytes Relative: 8.2 %
Neutro Abs: 5687 cells/uL (ref 1500–7800)
Neutrophils Relative %: 63.9 %
Platelets: 241 10*3/uL (ref 140–400)
RBC: 4.18 10*6/uL — ABNORMAL LOW (ref 4.20–5.80)
RDW: 15.4 % — ABNORMAL HIGH (ref 11.0–15.0)
Total Lymphocyte: 25.5 %
WBC: 8.9 10*3/uL (ref 3.8–10.8)

## 2021-11-05 LAB — LIPID PANEL
Cholesterol: 149 mg/dL (ref <200)
HDL: 62 mg/dL (ref >=40)
LDL Cholesterol (Calc): 71 mg/dL (calc)
Non-HDL Cholesterol (Calc): 87 mg/dL (calc) (ref <130)
Total CHOL/HDL Ratio: 2.4 (calc) (ref <5.0)
Triglycerides: 78 mg/dL (ref <150)

## 2021-11-05 LAB — URIC ACID: Uric Acid, Serum: 8 mg/dL (ref 4.0–8.0)

## 2021-11-07 ENCOUNTER — Other Ambulatory Visit: Payer: Self-pay

## 2021-11-07 ENCOUNTER — Encounter: Payer: Self-pay | Admitting: Cardiology

## 2021-11-07 ENCOUNTER — Ambulatory Visit: Payer: Medicare HMO | Admitting: Cardiology

## 2021-11-07 VITALS — BP 154/72 | HR 61 | Ht 67.0 in | Wt 239.0 lb

## 2021-11-07 DIAGNOSIS — Z6837 Body mass index (BMI) 37.0-37.9, adult: Secondary | ICD-10-CM | POA: Diagnosis not present

## 2021-11-07 DIAGNOSIS — I10 Essential (primary) hypertension: Secondary | ICD-10-CM

## 2021-11-07 DIAGNOSIS — E7800 Pure hypercholesterolemia, unspecified: Secondary | ICD-10-CM

## 2021-11-07 MED ORDER — SPIRONOLACTONE 50 MG PO TABS: 50.0000 mg | ORAL_TABLET | Freq: Every day | ORAL | 3 refills | Status: DC

## 2021-11-07 NOTE — Patient Instructions (Signed)
Medication Instructions:   Your physician has recommended you make the following change in your medication:   START taking Aldactone 50 MG once a day.  *If you need a refill on your cardiac medications before your next appointment, please call your pharmacy*   Lab Work:  Your physician recommends that you return for lab work (BMP) in: 1 WEEK    Please return to our office on_____________________at______________am/pm    Testing/Procedures:  None ordered   Follow-Up: At [facility], you and your health needs are our priority.  As part of our continuing mission to provide you with exceptional heart care, we have created designated Provider Care Teams.  These Care Teams include your primary Cardiologist (physician) and Advanced Practice Providers (APPs -  Physician Assistants and Nurse Practitioners) who all work together to provide you with the care you need, when you need it.  We recommend signing up for the patient portal called "MyChart".  Sign up information is provided on this After Visit Summary.  MyChart is used to connect with patients for Virtual Visits (Telemedicine).  Patients are able to view lab/test results, encounter notes, upcoming appointments, etc.  Non-urgent messages can be sent to your provider as well.   To learn more about what you can do with MyChart, go to [email or web address].    Your next appointment:   6 week(s)  The format for your next appointment:   In Person  Provider:   ONLY WITH  Kate Sable, MD    Other Instructions

## 2021-11-07 NOTE — Progress Notes (Signed)
Cardiology Office Note:    Date:  11/07/2021   ID:  Todd Hutchinson, DOB 07-Sep-1951, MRN [id number]  PCP:  Todd Grana, PA-C   [facility] Providers Cardiologist:  None     Referring MD: Todd Grana, PA-C   Chief Complaint  Patient presents with   OTher    3 month follow up. Meds reviewed verbally with patient.     History of Present Illness:    Todd Hutchinson is a 71 y.o. male with a hx of hypertension, hyperlipidemia, COPD, former smoker x40+ years who presents for follow-up.    Patient being seen for hypertension.  Currently takes amlodipine 10 mg, bisoprolol 10 mg, lisinopril 40 mg, chlorthalidone 50 mg.  He previously tried carvedilol, but states having nausea and upset stomach prompting him to stop.  Feels well, trying to lose weight and eat healthier.  He does not exercise much due to gout in his toes.  Checks his blood pressure frequently at home, last readings in the 355D to 322G systolic.   Prior notes  previously was on carvedilol but did not tolerate due to abdominal discomfort and diarrhea.     Past Medical History:  Diagnosis Date   Abnormal weight gain 03/13/2016   COPD (chronic obstructive pulmonary disease) ([facility])    Hx of malignant neoplasm of prostate 05/31/2012   Hx of tobacco use, presenting hazards to health 04/17/2016   Hypercholesterolemia 03/13/2016   Hyperlipidemia    Hypertension    Hypertensive nephrosclerosis 01/27/2018   Prostate cancer ([place])    Sleep apnea    uses CPAP    Past Surgical History:  Procedure Laterality Date   COLONOSCOPY WITH PROPOFOL N/A 07/29/2018   Procedure: COLONOSCOPY WITH BIOPSIES;  Surgeon: Lucilla Lame, MD;  Location: [place];  Service: Endoscopy;  Laterality: N/A;   POLYPECTOMY N/A 07/29/2018   Procedure: POLYPECTOMY;  Surgeon: Lucilla Lame, MD;  Location: [place];  Service: Endoscopy;  Laterality: N/A;   PROSTATE SURGERY      Current Medications: Current Meds  Medication  Sig   allopurinol (ZYLOPRIM) 100 MG tablet TAKE 1 TABLET BY MOUTH EVERY DAY   amLODipine-benazepril (LOTREL) 10-40 MG capsule TAKE 1 CAPSULE DAILY   bisoprolol (ZEBETA) 10 MG tablet Take 1 tablet (10 mg total) by mouth daily.   chlorthalidone (HYGROTON) 50 MG tablet TAKE 1 TABLET DAILY (DOSE   INCREASE)   Cholecalciferol (VITAMIN D-3) 1000 units CAPS Take 1 capsule (1,000 Units total) by mouth daily.   indomethacin (INDOCIN) 25 MG capsule At first sign of pain/gout flare take 50 mg PO TID for 2 to 3 days and then decrease to 25 mg po TID x 2 d   Omega-3 Fatty Acids (FISH OIL) 1000 MG CAPS Take 1,000 mg by mouth daily.   pravastatin (PRAVACHOL) 40 MG tablet TAKE 1 TABLET AT BEDTIME   spironolactone (ALDACTONE) 50 MG tablet Take 1 tablet (50 mg total) by mouth daily.     Allergies:   Atorvastatin   Social History   Socioeconomic History   Marital status: Married    Spouse name: Todd Hutchinson    Number of children: 1   Years of education: Not on file   Highest education level: 12th grade  Occupational History    Comment: Retired   Tobacco Use   Smoking status: Former    Packs/day: 1.00    Years: 40.00    Pack years: 40.00    Types: Cigarettes    Quit date: 11/2013  Years since quitting: 7.9   Smokeless tobacco: Never  Vaping Use   Vaping Use: Never used  Substance and Sexual Activity   Alcohol use: Yes    Alcohol/week: 16.0 standard drinks    Types: 16 Shots of liquor per week    Comment: no more than 4 whiskey drinks a day; maybe 4 times day    Drug use: No   Sexual activity: Never    Partners: Female  Other Topics Concern   Not on file  Social History Narrative   Not on file   Social Determinants of Health   Financial Resource Strain: Not on file  Food Insecurity: Not on file  Transportation Needs: Not on file  Physical Activity: Not on file  Stress: Not on file  Social Connections: Not on file     Family History: The patient's family history includes Aneurysm  (age of onset: 62) in his mother; Cancer in his brother; Heart attack in his father; Heart disease in his paternal grandfather; Heart failure in his brother; Hypertension in his brother, brother, father, mother, sister, and sister. There is no history of Kidney cancer, Kidney failure, Prostate cancer, Sickle cell anemia, or Tuberculosis.  ROS:   Please see the history of present illness.     All other systems reviewed and are negative.  EKGs/Labs/Other Studies Reviewed:    The following studies were reviewed today:   EKG:  EKG is  ordered today.  The ekg ordered today demonstrates sinus rhythm  Recent Labs: 11/05/2021: ALT 14; BUN 20; Creat 1.20; Hemoglobin 11.1; Platelets 241; Potassium 3.9; Sodium 142  Recent Lipid Panel    Component Value Date/Time   CHOL 149 11/05/2021 0939   CHOL 139 04/18/2015 0823   TRIG 78 11/05/2021 0939   HDL 62 11/05/2021 0939   HDL 46 04/18/2015 0823   CHOLHDL 2.4 11/05/2021 0939   VLDL 46 (H) 04/15/2017 0921   LDLCALC 71 11/05/2021 0939     Risk Assessment/Calculations:          Physical Exam:    VS:  BP (!) 154/72 (BP Location: Right Arm, Patient Position: Sitting, Cuff Size: Normal)    Pulse 61    Ht 5\' 7"  (1.702 m)    Wt 239 lb (108.4 kg)    SpO2 96%    BMI 37.43 kg/m     Wt Readings from Last 3 Encounters:  11/07/21 239 lb (108.4 kg)  10/30/21 238 lb 1.6 oz (108 kg)  08/28/21 234 lb 6.4 oz (106.3 kg)     GEN:  Well nourished, well developed in no acute distress HEENT: Normal NECK: No JVD; No carotid bruits CARDIAC: RRR, no murmurs, rubs, gallops RESPIRATORY:  Clear to auscultation without rales, wheezing or rhonchi  ABDOMEN: Soft, non-tender, non-distended MUSCULOSKELETAL:  No edema; No deformity  SKIN: Warm and dry NEUROLOGIC:  Alert and oriented x 3 PSYCHIATRIC:  Normal affect   ASSESSMENT:    1. Primary hypertension   2. Pure hypercholesterolemia   3. BMI 37.0-37.9, adult    PLAN:    In order of problems listed  above:  Hypertension, BP elevated.  Start Aldactone 50 mg daily, continue bisoprolol to 10 mg daily,chlorthalidone, Lotrel 10-40mg  qd as prescribed.  Check BMP in 1 week.  Low-salt diet, exercise advised. Hyperlipidemia, cholesterol controlled, continue Pravachol  Obesity, weight loss, exercise recommended.  Follow-up in 6 weeks.     Medication Adjustments/Labs and Tests Ordered: Current medicines are reviewed at length with the patient today.  Concerns  regarding medicines are outlined above.  Orders Placed This Encounter  Procedures   Basic metabolic panel   EKG 99-MEQA    Meds ordered this encounter  Medications   spironolactone (ALDACTONE) 50 MG tablet    Sig: Take 1 tablet (50 mg total) by mouth daily.    Dispense:  30 tablet    Refill:  3     Patient Instructions  Medication Instructions:   Your physician has recommended you make the following change in your medication:   START taking Aldactone 50 MG once a day.  *If you need a refill on your cardiac medications before your next appointment, please call your pharmacy*   Lab Work:  Your physician recommends that you return for lab work (BMP) in: 1 WEEK    Please return to our office on_____________________at______________am/pm    Testing/Procedures:  None ordered   Follow-Up: At [facility], you and your health needs are our priority.  As part of our continuing mission to provide you with exceptional heart care, we have created designated Provider Care Teams.  These Care Teams include your primary Cardiologist (physician) and Advanced Practice Providers (APPs -  Physician Assistants and Nurse Practitioners) who all work together to provide you with the care you need, when you need it.  We recommend signing up for the patient portal called "MyChart".  Sign up information is provided on this After Visit Summary.  MyChart is used to connect with patients for Virtual Visits (Telemedicine).  Patients are able  to view lab/test results, encounter notes, upcoming appointments, etc.  Non-urgent messages can be sent to your provider as well.   To learn more about what you can do with MyChart, go to [email or web address].    Your next appointment:   6 week(s)  The format for your next appointment:   In Person  Provider:   ONLY WITH  Kate Sable, MD    Other Instructions     Signed, Kate Sable, MD  11/07/2021 12:32 PM    [place]

## 2021-11-08 NOTE — Addendum Note (Signed)
Addended by: Talitha Givens on: 11/08/2021 09:02 AM   Modules accepted: Orders

## 2021-11-12 DIAGNOSIS — D649 Anemia, unspecified: Secondary | ICD-10-CM | POA: Diagnosis not present

## 2021-11-13 LAB — COMPLETE METABOLIC PANEL WITH GFR
AG Ratio: 1.3 (calc) (ref 1.0–2.5)
ALT: 9 U/L (ref 9–46)
AST: 9 U/L — ABNORMAL LOW (ref 10–35)
Albumin: 4.1 g/dL (ref 3.6–5.1)
Alkaline phosphatase (APISO): 41 U/L (ref 35–144)
BUN: 23 mg/dL (ref 7–25)
CO2: 24 mmol/L (ref 20–32)
Calcium: 10.1 mg/dL (ref 8.6–10.3)
Chloride: 105 mmol/L (ref 98–110)
Creat: 1.29 mg/dL (ref 0.70–1.35)
Globulin: 3.1 g/dL (calc) (ref 1.9–3.7)
Glucose, Bld: 104 mg/dL — ABNORMAL HIGH (ref 65–99)
Potassium: 4.3 mmol/L (ref 3.5–5.3)
Sodium: 141 mmol/L (ref 135–146)
Total Bilirubin: 0.4 mg/dL (ref 0.2–1.2)
Total Protein: 7.2 g/dL (ref 6.1–8.1)
eGFR: 60 mL/min/{1.73_m2} (ref >=60)

## 2021-11-13 LAB — IRON,TIBC AND FERRITIN PANEL
%SAT: 20 % (calc) (ref 20–48)
Ferritin: 34 ng/mL (ref 24–380)
Iron: 61 ug/dL (ref 50–180)
TIBC: 303 mcg/dL (calc) (ref 250–425)

## 2021-11-13 LAB — B12 AND FOLATE PANEL
Folate: 8.7 ng/mL
Vitamin B-12: 351 pg/mL (ref 200–1100)

## 2021-11-14 ENCOUNTER — Other Ambulatory Visit (INDEPENDENT_AMBULATORY_CARE_PROVIDER_SITE_OTHER): Payer: Medicare HMO

## 2021-11-14 ENCOUNTER — Other Ambulatory Visit: Payer: Self-pay

## 2021-11-14 DIAGNOSIS — I10 Essential (primary) hypertension: Secondary | ICD-10-CM | POA: Diagnosis not present

## 2021-11-15 LAB — BASIC METABOLIC PANEL
BUN/Creatinine Ratio: 19 (ref 10–24)
BUN: 21 mg/dL (ref 8–27)
CO2: 20 mmol/L (ref 20–29)
Calcium: 10.1 mg/dL (ref 8.6–10.2)
Chloride: 103 mmol/L (ref 96–106)
Creatinine, Ser: 1.08 mg/dL (ref 0.76–1.27)
Glucose: 98 mg/dL (ref 70–99)
Potassium: 4.2 mmol/L (ref 3.5–5.2)
Sodium: 141 mmol/L (ref 134–144)
eGFR: 74 mL/min/{1.73_m2} (ref >59)

## 2021-11-21 DIAGNOSIS — E119 Type 2 diabetes mellitus without complications: Secondary | ICD-10-CM | POA: Diagnosis not present

## 2021-11-21 DIAGNOSIS — H2513 Age-related nuclear cataract, bilateral: Secondary | ICD-10-CM | POA: Diagnosis not present

## 2021-11-21 DIAGNOSIS — H35033 Hypertensive retinopathy, bilateral: Secondary | ICD-10-CM | POA: Diagnosis not present

## 2021-12-03 DIAGNOSIS — G4733 Obstructive sleep apnea (adult) (pediatric): Secondary | ICD-10-CM | POA: Diagnosis not present

## 2021-12-19 ENCOUNTER — Encounter: Payer: Self-pay | Admitting: Cardiology

## 2021-12-19 ENCOUNTER — Ambulatory Visit: Payer: Medicare HMO | Admitting: Cardiology

## 2021-12-19 VITALS — BP 140/76 | HR 76 | Ht 67.0 in | Wt 240.0 lb

## 2021-12-19 DIAGNOSIS — Z6837 Body mass index (BMI) 37.0-37.9, adult: Secondary | ICD-10-CM | POA: Diagnosis not present

## 2021-12-19 DIAGNOSIS — E7800 Pure hypercholesterolemia, unspecified: Secondary | ICD-10-CM

## 2021-12-19 DIAGNOSIS — I10 Essential (primary) hypertension: Secondary | ICD-10-CM

## 2021-12-19 NOTE — Progress Notes (Signed)
?Cardiology Office Note:   ? ?Date:  12/19/2021  ? ?ID:  Todd Hutchinson, DOB 05/07/1951, MRN [id number] ? ?PCP:  Delsa Grana, PA-C ?  ?[facility] Providers ?Cardiologist:  None    ? ?Referring MD: Delsa Grana, PA-C  ? ?Chief Complaint  ?Patient presents with  ? other  ?  6 week follow up -- Meds reviewed verbally with patient.   ? ? ?History of Present Illness:   ? ?Todd Hutchinson is a 71 y.o. male with a hx of hypertension, hyperlipidemia, COPD, former smoker x40+ years who presents for follow-up.   ? ?Patient being seen for hypertension and medication management.  Blood pressures have been difficult to control, Aldactone 50 mg was started after last visit.  He endorsed reducing his salt intake, blood pressures have overall improved with systolics anywhere from 376E to 150s on average.  He is compliant with taking his meds as prescribed.   ? ?Prior notes  ?previously was on carvedilol but did not tolerate due to abdominal discomfort and diarrhea.   ? ? ?Past Medical History:  ?Diagnosis Date  ? Abnormal weight gain 03/13/2016  ? COPD (chronic obstructive pulmonary disease) ([place])   ? Hx of malignant neoplasm of prostate 05/31/2012  ? Hx of tobacco use, presenting hazards to health 04/17/2016  ? Hypercholesterolemia 03/13/2016  ? Hyperlipidemia   ? Hypertension   ? Hypertensive nephrosclerosis 01/27/2018  ? Prostate cancer ([place])   ? Sleep apnea   ? uses CPAP  ? ? ?Past Surgical History:  ?Procedure Laterality Date  ? COLONOSCOPY WITH PROPOFOL N/A 07/29/2018  ? Procedure: COLONOSCOPY WITH BIOPSIES;  Surgeon: Lucilla Lame, MD;  Location: [place];  Service: Endoscopy;  Laterality: N/A;  ? POLYPECTOMY N/A 07/29/2018  ? Procedure: POLYPECTOMY;  Surgeon: Lucilla Lame, MD;  Location: [place];  Service: Endoscopy;  Laterality: N/A;  ? PROSTATE SURGERY    ? ? ?Current Medications: ?Current Meds  ?Medication Sig  ? allopurinol (ZYLOPRIM) 100 MG tablet TAKE 1 TABLET BY MOUTH EVERY DAY  ?  amLODipine-benazepril (LOTREL) 10-40 MG capsule TAKE 1 CAPSULE DAILY  ? bisoprolol (ZEBETA) 10 MG tablet Take 1 tablet (10 mg total) by mouth daily.  ? chlorthalidone (HYGROTON) 50 MG tablet TAKE 1 TABLET DAILY (DOSE   INCREASE)  ? Cholecalciferol (VITAMIN D-3) 1000 units CAPS Take 1 capsule (1,000 Units total) by mouth daily.  ? indomethacin (INDOCIN) 25 MG capsule At first sign of pain/gout flare take 50 mg PO TID for 2 to 3 days and then decrease to 25 mg po TID x 2 d  ? Omega-3 Fatty Acids (FISH OIL) 1000 MG CAPS Take 1,000 mg by mouth daily.  ? pravastatin (PRAVACHOL) 40 MG tablet TAKE 1 TABLET AT BEDTIME  ? spironolactone (ALDACTONE) 50 MG tablet Take 1 tablet (50 mg total) by mouth daily.  ?  ? ?Allergies:   Atorvastatin  ? ?Social History  ? ?Socioeconomic History  ? Marital status: Married  ?  Spouse name: Todd Hutchinson   ? Number of children: 1  ? Years of education: Not on file  ? Highest education level: 12th grade  ?Occupational History  ?  Comment: Retired   ?Tobacco Use  ? Smoking status: Former  ?  Packs/day: 1.00  ?  Years: 40.00  ?  Pack years: 40.00  ?  Types: Cigarettes  ?  Quit date: 11/2013  ?  Years since quitting: 8.1  ? Smokeless tobacco: Never  ?Vaping Use  ? Vaping Use:  Never used  ?Substance and Sexual Activity  ? Alcohol use: Yes  ?  Alcohol/week: 16.0 standard drinks  ?  Types: 16 Shots of liquor per week  ?  Comment: no more than 4 whiskey drinks a day; maybe 4 times day   ? Drug use: No  ? Sexual activity: Never  ?  Partners: Female  ?Other Topics Concern  ? Not on file  ?Social History Narrative  ? Not on file  ? ?Social Determinants of Health  ? ?Financial Resource Strain: Not on file  ?Food Insecurity: Not on file  ?Transportation Needs: Not on file  ?Physical Activity: Not on file  ?Stress: Not on file  ?Social Connections: Not on file  ?  ? ?Family History: ?The patient's family history includes Aneurysm (age of onset: 82) in his mother; Cancer in his brother; Heart attack in his  father; Heart disease in his paternal grandfather; Heart failure in his brother; Hypertension in his brother, brother, father, mother, sister, and sister. There is no history of Kidney cancer, Kidney failure, Prostate cancer, Sickle cell anemia, or Tuberculosis. ? ?ROS:   ?Please see the history of present illness.    ? All other systems reviewed and are negative. ? ?EKGs/Labs/Other Studies Reviewed:   ? ?The following studies were reviewed today: ? ? ?EKG:  EKG not ordered today.  ? ?Recent Labs: ?11/05/2021: Hemoglobin 11.1; Platelets 241 ?11/12/2021: ALT 9 ?11/14/2021: BUN 21; Creatinine, Ser 1.08; Potassium 4.2; Sodium 141  ?Recent Lipid Panel ?   ?Component Value Date/Time  ? CHOL 149 11/05/2021 0939  ? CHOL 139 04/18/2015 0823  ? TRIG 78 11/05/2021 0939  ? HDL 62 11/05/2021 0939  ? HDL 46 04/18/2015 0823  ? CHOLHDL 2.4 11/05/2021 0939  ? VLDL 46 (H) 04/15/2017 8119  ? [place] 71 11/05/2021 0939  ? ? ? ?Risk Assessment/Calculations:   ? ? ?    ? ?Physical Exam:   ? ?VS:  BP 140/76 (BP Location: Left Arm, Patient Position: Sitting, Cuff Size: Large)   Pulse 76   Ht '5\' 7"'$  (1.702 m)   Wt 240 lb (108.9 kg)   SpO2 96%   BMI 37.59 kg/m?    ? ?Wt Readings from Last 3 Encounters:  ?12/19/21 240 lb (108.9 kg)  ?11/07/21 239 lb (108.4 kg)  ?10/30/21 238 lb 1.6 oz (108 kg)  ?  ? ?GEN:  Well nourished, well developed in no acute distress ?HEENT: Normal ?NECK: No JVD; No carotid bruits ?CARDIAC: RRR, no murmurs, rubs, gallops ?RESPIRATORY:  Clear to auscultation without rales, wheezing or rhonchi  ?ABDOMEN: Soft, non-tender, non-distended ?MUSCULOSKELETAL:  No edema; No deformity  ?SKIN: Warm and dry ?NEUROLOGIC:  Alert and oriented x 3 ?PSYCHIATRIC:  Normal affect  ? ?ASSESSMENT:   ? ?1. Primary hypertension   ?2. Pure hypercholesterolemia   ?3. BMI 37.0-37.9, adult   ? ? ?PLAN:   ? ?In order of problems listed above: ? ?Hypertension, BP elevated but improving.  Cont Aldactone 50 mg daily, bisoprolol to 10 mg daily,  chlorthalidone 50, Lotrel 10-'40mg'$  qd as prescribed.  Obtain renal arterial ultrasound to evaluate secondary hypertension causes.  Low-salt diet, exercise advised. ?Hyperlipidemia, cholesterol controlled, continue Pravachol ?Obesity, weight loss, exercise recommended. ? ?Follow-up in 3 months. ?  ? ? ?Medication Adjustments/Labs and Tests Ordered: ?Current medicines are reviewed at length with the patient today.  Concerns regarding medicines are outlined above.  ?Orders Placed This Encounter  ?Procedures  ? VAS US RENAL ARTERY DUPLEX  ? ? ?  No orders of the defined types were placed in this encounter. ? ? ? ?Patient Instructions  ?Medication Instructions:  ?No changes at this time.  ? ?*If you need a refill on your cardiac medications before your next appointment, please call your pharmacy* ? ? ?Lab Work: ?None ? ?If you have labs (blood work) drawn today and your tests are completely normal, you will receive your results only by: ?MyChart Message (if you have MyChart) OR ?A paper copy in the mail ?If you have any lab test that is abnormal or we need to change your treatment, we will call you to review the results. ? ? ?Testing/Procedures: ?Your physician has requested that you have a renal artery duplex. During this test, an ultrasound is used to evaluate blood flow to the kidneys. Allow one hour for this exam. Do not eat after midnight the day before and avoid carbonated beverages. Take your medications as you usually do. ? ? ? ?Follow-Up: ?At [facility], you and your health needs are our priority.  As part of our continuing mission to provide you with exceptional heart care, we have created designated Provider Care Teams.  These Care Teams include your primary Cardiologist (physician) and Advanced Practice Providers (APPs -  Physician Assistants and Nurse Practitioners) who all work together to provide you with the care you need, when you need it. ? ? ? ?Your next appointment:   ?3 month(s) ? ?The format for  your next appointment:   ?In Person ? ?Provider:   ?Kate Sable, MD  ? ?Signed, ?Kate Sable, MD  ?12/19/2021 12:14 PM    ?[place] ? ?

## 2021-12-19 NOTE — Patient Instructions (Signed)
Medication Instructions:  ?No changes at this time.  ? ?*If you need a refill on your cardiac medications before your next appointment, please call your pharmacy* ? ? ?Lab Work: ?None ? ?If you have labs (blood work) drawn today and your tests are completely normal, you will receive your results only by: ?MyChart Message (if you have MyChart) OR ?A paper copy in the mail ?If you have any lab test that is abnormal or we need to change your treatment, we will call you to review the results. ? ? ?Testing/Procedures: ?Your physician has requested that you have a renal artery duplex. During this test, an ultrasound is used to evaluate blood flow to the kidneys. Allow one hour for this exam. Do not eat after midnight the day before and avoid carbonated beverages. Take your medications as you usually do. ? ? ? ?Follow-Up: ?At [facility], you and your health needs are our priority.  As part of our continuing mission to provide you with exceptional heart care, we have created designated Provider Care Teams.  These Care Teams include your primary Cardiologist (physician) and Advanced Practice Providers (APPs -  Physician Assistants and Nurse Practitioners) who all work together to provide you with the care you need, when you need it. ? ? ? ?Your next appointment:   ?3 month(s) ? ?The format for your next appointment:   ?In Person ? ?Provider:   ?Kate Sable, MD ?

## 2021-12-30 DIAGNOSIS — G4733 Obstructive sleep apnea (adult) (pediatric): Secondary | ICD-10-CM | POA: Diagnosis not present

## 2021-12-30 DIAGNOSIS — E669 Obesity, unspecified: Secondary | ICD-10-CM | POA: Diagnosis not present

## 2021-12-30 DIAGNOSIS — R0602 Shortness of breath: Secondary | ICD-10-CM | POA: Diagnosis not present

## 2021-12-30 DIAGNOSIS — R69 Illness, unspecified: Secondary | ICD-10-CM | POA: Diagnosis not present

## 2022-01-14 ENCOUNTER — Ambulatory Visit (INDEPENDENT_AMBULATORY_CARE_PROVIDER_SITE_OTHER): Payer: Medicare HMO

## 2022-01-14 DIAGNOSIS — I10 Essential (primary) hypertension: Secondary | ICD-10-CM

## 2022-01-20 ENCOUNTER — Telehealth: Payer: Self-pay

## 2022-01-20 DIAGNOSIS — I701 Atherosclerosis of renal artery: Secondary | ICD-10-CM

## 2022-01-20 NOTE — Telephone Encounter (Signed)
The patient has been notified of the result and verbalized understanding.  All questions (if any) were answered. ? ?Will route to scheduling to get patient scheduled with Dr. Fletcher Anon. ? ?

## 2022-01-20 NOTE — Telephone Encounter (Signed)
-----   Message from Kate Sable, MD sent at 01/17/2022  5:01 PM EDT ----- ?Some renal artery stenosis noted bilaterally between 1-59%.  Refer patient to peripheral clinic with Dr. Fletcher Anon to see if patient will benefit from a renal angiogram and or possible intervention.  Thank you ?

## 2022-01-21 ENCOUNTER — Other Ambulatory Visit: Payer: Self-pay | Admitting: Family Medicine

## 2022-01-21 DIAGNOSIS — M1A0790 Idiopathic chronic gout, unspecified ankle and foot, without tophus (tophi): Secondary | ICD-10-CM

## 2022-01-27 ENCOUNTER — Other Ambulatory Visit: Payer: Self-pay

## 2022-01-27 MED ORDER — BISOPROLOL FUMARATE 10 MG PO TABS: 10.0000 mg | ORAL_TABLET | Freq: Every day | ORAL | 0 refills | Status: DC

## 2022-01-29 ENCOUNTER — Encounter: Payer: Self-pay | Admitting: Physician Assistant

## 2022-01-29 ENCOUNTER — Ambulatory Visit (INDEPENDENT_AMBULATORY_CARE_PROVIDER_SITE_OTHER): Payer: Medicare HMO | Admitting: Physician Assistant

## 2022-01-29 VITALS — BP 138/72 | HR 83 | Resp 16 | Ht 67.0 in | Wt 240.0 lb

## 2022-01-29 DIAGNOSIS — I10 Essential (primary) hypertension: Secondary | ICD-10-CM | POA: Diagnosis not present

## 2022-01-29 NOTE — Assessment & Plan Note (Addendum)
Chronic, ongoing condition ?Reports excellent compliance with medications ?Currently taking Lotrel 10-40 mg PO QD, Bisoprolol 10 mg PO QD, Chlorthalidone 50 mg PO QD, Spironolactone 50 mg PO QD ?States BP readings at home are typically 130s-140s and denies headaches, SOB, CP, edema, vision changes ?He is followed by cardiology with most recent change being addition of Aldactone 50 mg in Feb ?Cardiology is evaluating for renal artery stenosis- [place] has been completed showing signs of some degree of blockage with plans for further eval in the next few weeks ?Will defer to cardiology for further management after upcoming eval ?Reviewed previous labs from Feb.  ?Recommend he continue with current medications and taking BP at home in log  ?Reviewed signs of HTN urgency and emergency ?Reviewed return and ED precautions ?Follow up in 6 months ? ?

## 2022-01-29 NOTE — Patient Instructions (Signed)
Continue taking your medications as directed by your cardiologist ?Keep your upcoming apt for the renal artery evaluation ? ?Try to walk at least 20 minutes a day to help with BP and cholesterol ?Continue with your efforts to reduce salt intake and stay well hydrated with plenty of water ? ?

## 2022-01-29 NOTE — Progress Notes (Signed)
? ? ? ?     Established Patient Office Visit ? ?Name: Todd Hutchinson   MRN: [id number]    DOB: [birth year]   Date:01/29/2022 ? ?Today's Provider: Talitha Givens, MHS, PA-C ?Introduced myself to the patient as a Journalist, newspaper and provided education on APPs in clinical practice.  ? ?      ?Subjective ? ?Chief Complaint ? ?Chief Complaint  ?Patient presents with  ? Follow-up  ? ? ?HPI ? ?Hypertension: ?- Medications:  ?- Compliance: Reports he is taking them everyday  ?- Checking BP at home: yes states it's usually 130s-140s  ?- Denies any SOB, CP, vision changes, LE edema, medication SEs, or symptoms of hypotension ?- Diet: Reports he is eating less salt. Has been refraining from excess salt for the last 3 months.  ?- Exercise: He is not exercising  ? ? ?Reports intermittent chest pains - unable to identify triggers, states it resolves in seconds  ? ?Seeing cardiology for BP management ?Recently had renal arterial duplex to assess for secondary HTN causes- renal artery stenosis identified and he has been referred for additional eval and potential imaging  ? ? ?Patient Active Problem List  ? Diagnosis Date Noted  ? Anemia 10/11/2020  ? Irregular heart rhythm 10/11/2020  ? Class 2 severe obesity with serious comorbidity and body mass index (BMI) of 38.0 to 38.9 in adult [facility]) 11/02/2018  ? History of colonic polyps   ? Benign neoplasm of ascending colon   ? Benign neoplasm of cecum   ? Hypertensive nephrosclerosis 01/27/2018  ? Prediabetes 06/30/2017  ? COPD (chronic obstructive pulmonary disease) ([place]) 04/15/2017  ? Hx of tobacco use, presenting hazards to health 04/17/2016  ? Hyperlipidemia 03/13/2016  ? Benign essential hypertension 07/10/2015  ? Stage 3 chronic kidney disease ([place]) 07/10/2015  ? Cystitis, radiation 05/31/2012  ? Hematuria, microscopic 05/31/2012  ? ED (erectile dysfunction) of organic origin 05/31/2012  ? Hx of malignant neoplasm of prostate 05/31/2012  ? ? ?Past Surgical History:  ?Procedure Laterality  Date  ? COLONOSCOPY WITH PROPOFOL N/A 07/29/2018  ? Procedure: COLONOSCOPY WITH BIOPSIES;  Surgeon: Lucilla Lame, MD;  Location: [place];  Service: Endoscopy;  Laterality: N/A;  ? POLYPECTOMY N/A 07/29/2018  ? Procedure: POLYPECTOMY;  Surgeon: Lucilla Lame, MD;  Location: [place];  Service: Endoscopy;  Laterality: N/A;  ? PROSTATE SURGERY    ? ? ?Family History  ?Problem Relation Age of Onset  ? Hypertension Mother   ? Aneurysm Mother 22  ?     brain  ? Hypertension Father   ? Heart attack Father   ? Hypertension Sister   ? Hypertension Brother   ? Heart failure Brother   ? Heart disease Paternal Grandfather   ? Hypertension Sister   ? Hypertension Brother   ? Cancer Brother   ?     throat cancer  ? Kidney cancer Neg Hx   ? Kidney failure Neg Hx   ? Prostate cancer Neg Hx   ? Sickle cell anemia Neg Hx   ? Tuberculosis Neg Hx   ? ? ?Social History  ? ?Tobacco Use  ? Smoking status: Former  ?  Packs/day: 1.00  ?  Years: 40.00  ?  Pack years: 40.00  ?  Types: Cigarettes  ?  Quit date: 11/2013  ?  Years since quitting: 8.2  ? Smokeless tobacco: Never  ?Substance Use Topics  ? Alcohol use: Yes  ?  Alcohol/week: 16.0 standard drinks  ?  Types:  16 Shots of liquor per week  ?  Comment: no more than 4 whiskey drinks a day; maybe 4 times day   ? ? ? ?Current Outpatient Medications:  ?  allopurinol (ZYLOPRIM) 100 MG tablet, TAKE 1 TABLET BY MOUTH EVERY DAY, Disp: 90 tablet, Rfl: 0 ?  amLODipine-benazepril (LOTREL) 10-40 MG capsule, TAKE 1 CAPSULE DAILY, Disp: 90 capsule, Rfl: 1 ?  bisoprolol (ZEBETA) 10 MG tablet, Take 1 tablet (10 mg total) by mouth daily., Disp: 90 tablet, Rfl: 0 ?  chlorthalidone (HYGROTON) 50 MG tablet, TAKE 1 TABLET DAILY (DOSE   INCREASE), Disp: 90 tablet, Rfl: 1 ?  Cholecalciferol (VITAMIN D-3) 1000 units CAPS, Take 1 capsule (1,000 Units total) by mouth daily., Disp: , Rfl:  ?  indomethacin (INDOCIN) 25 MG capsule, At first sign of pain/gout flare take 50 mg PO TID for 2 to 3  days and then decrease to 25 mg po TID x 2 d, Disp: 60 capsule, Rfl: 1 ?  Omega-3 Fatty Acids (FISH OIL) 1000 MG CAPS, Take 1,000 mg by mouth daily., Disp: , Rfl:  ?  pravastatin (PRAVACHOL) 40 MG tablet, TAKE 1 TABLET AT BEDTIME, Disp: 90 tablet, Rfl: 1 ?  spironolactone (ALDACTONE) 50 MG tablet, Take 1 tablet (50 mg total) by mouth daily., Disp: 30 tablet, Rfl: 3 ? ?Allergies  ?Allergen Reactions  ? Atorvastatin Rash  ? ? ?I personally reviewed active problem list, medication list, allergies with the patient/caregiver today. ? ? ?Review of Systems  ?HENT:  Negative for hearing loss and tinnitus.   ?Eyes:  Negative for blurred vision and double vision.  ?Respiratory:  Negative for cough, shortness of breath and wheezing.   ?Cardiovascular:  Negative for chest pain, palpitations and leg swelling.  ?Musculoskeletal:  Negative for back pain.  ?Neurological:  Negative for dizziness, tingling, weakness and headaches.  ? ? ? ?Objective ? ?Vitals:  ? 01/29/22 1139 01/29/22 1204  ?BP: (!) 156/72 138/72  ?Pulse: 83   ?Resp: 16   ?SpO2: 93%   ?Weight: 240 lb (108.9 kg)   ?Height: '5\' 7"'$  (1.702 m)   ? ? ?Body mass index is 37.59 kg/m?. ? ?Physical Exam ?Vitals reviewed.  ?Constitutional:   ?   General: He is awake.  ?   Appearance: Normal appearance. He is well-developed and well-groomed. He is obese.  ?HENT:  ?   Head: Normocephalic and atraumatic.  ?Cardiovascular:  ?   Rate and Rhythm: Normal rate and regular rhythm.  ?   Pulses: Normal pulses.     ?     Radial pulses are 2+ on the right side and 2+ on the left side.  ?   Heart sounds: Normal heart sounds.  ?Pulmonary:  ?   Effort: Pulmonary effort is normal.  ?   Breath sounds: Normal breath sounds. No decreased breath sounds, wheezing, rhonchi or rales.  ?Musculoskeletal:  ?   Right lower leg: No edema.  ?   Left lower leg: No edema.  ?Skin: ?   General: Skin is warm.  ?   Capillary Refill: Capillary refill takes less than 2 seconds.  ?Neurological:  ?   Mental Status: He  is alert.  ?Psychiatric:     ?   Attention and Perception: Attention normal.     ?   Mood and Affect: Mood normal.     ?   Speech: Speech normal.     ?   Behavior: Behavior normal. Behavior is cooperative.  ? ? ? ?Recent Results (  from the past 2160 hour(s))  ?Comprehensive Metabolic Panel (CMET)     Status: Abnormal  ? Collection Time: 11/05/21  9:39 AM  ?Result Value Ref Range  ? Glucose, Bld 100 (H) 65 - 99 mg/dL  ?  Comment: . ?           Fasting reference interval ?. ?For someone without known diabetes, a glucose value ?between 100 and 125 mg/dL is consistent with ?prediabetes and should be confirmed with a ?follow-up test. ?. ?  ? BUN 20 7 - 25 mg/dL  ? Creat 1.20 0.70 - 1.35 mg/dL  ? BUN/Creatinine Ratio NOT APPLICABLE 6 - 22 (calc)  ? Sodium 142 135 - 146 mmol/L  ? Potassium 3.9 3.5 - 5.3 mmol/L  ? Chloride 106 98 - 110 mmol/L  ? CO2 25 20 - 32 mmol/L  ? Calcium 9.7 8.6 - 10.3 mg/dL  ? Total Protein 7.2 6.1 - 8.1 g/dL  ? Albumin 4.1 3.6 - 5.1 g/dL  ? Globulin 3.1 1.9 - 3.7 g/dL (calc)  ? AG Ratio 1.3 1.0 - 2.5 (calc)  ? Total Bilirubin 0.4 0.2 - 1.2 mg/dL  ? Alkaline phosphatase (APISO) 34 (L) 35 - 144 U/L  ? AST 10 10 - 35 U/L  ? ALT 14 9 - 46 U/L  ?CBC w/Diff/Platelet     Status: Abnormal  ? Collection Time: 11/05/21  9:39 AM  ?Result Value Ref Range  ? WBC 8.9 3.8 - 10.8 Thousand/uL  ? RBC 4.18 (L) 4.20 - 5.80 Million/uL  ? Hemoglobin 11.1 (L) 13.2 - 17.1 g/dL  ? HCT 35.6 (L) 38.5 - 50.0 %  ? MCV 85.2 80.0 - 100.0 fL  ? MCH 26.6 (L) 27.0 - 33.0 pg  ? MCHC 31.2 (L) 32.0 - 36.0 g/dL  ? RDW 15.4 (H) 11.0 - 15.0 %  ? Platelets 241 140 - 400 Thousand/uL  ? MPV 12.8 (H) 7.5 - 12.5 fL  ? Neutro Abs 5,687 1,500 - 7,800 cells/uL  ? Lymphs Abs 2,270 850 - 3,900 cells/uL  ? Absolute Monocytes 730 200 - 950 cells/uL  ? Eosinophils Absolute 169 15 - 500 cells/uL  ? Basophils Absolute 45 0 - 200 cells/uL  ? Neutrophils Relative % 63.9 %  ? Total Lymphocyte 25.5 %  ? Monocytes Relative 8.2 %  ? Eosinophils Relative 1.9 %   ? Basophils Relative 0.5 %  ?Lipid panel     Status: None  ? Collection Time: 11/05/21  9:39 AM  ?Result Value Ref Range  ? Cholesterol 149 <200 mg/dL  ? HDL 62 > OR = 40 mg/dL  ? Triglycerides 78 <150

## 2022-02-04 ENCOUNTER — Other Ambulatory Visit: Payer: Self-pay | Admitting: *Deleted

## 2022-02-04 MED ORDER — SPIRONOLACTONE 50 MG PO TABS: 50.0000 mg | ORAL_TABLET | Freq: Every day | ORAL | 10 refills | Status: DC

## 2022-02-26 DIAGNOSIS — G4733 Obstructive sleep apnea (adult) (pediatric): Secondary | ICD-10-CM | POA: Diagnosis not present

## 2022-03-07 ENCOUNTER — Ambulatory Visit: Payer: Medicare HMO | Admitting: Cardiovascular Disease

## 2022-03-07 ENCOUNTER — Encounter: Payer: Self-pay | Admitting: Cardiovascular Disease

## 2022-03-07 VITALS — BP 138/60 | HR 75 | Ht 67.0 in | Wt 237.1 lb

## 2022-03-07 DIAGNOSIS — I10 Essential (primary) hypertension: Secondary | ICD-10-CM

## 2022-03-07 DIAGNOSIS — I701 Atherosclerosis of renal artery: Secondary | ICD-10-CM

## 2022-03-07 DIAGNOSIS — E785 Hyperlipidemia, unspecified: Secondary | ICD-10-CM | POA: Diagnosis not present

## 2022-03-07 NOTE — Progress Notes (Signed)
Cardiology Office Note   Date:  03/07/2022   ID:  Phill Mathwig, DOB December 23, 1950, MRN [id number]  PCP:  Providence Crosby, PA-C  Cardiologist:  Dr. Azucena Cecil  Chief Complaint  Patient presents with   Other    Renal Artery stenosis. Meds reviewed verbally with pt.      History of Present Illness: Todd Hutchinson is a 71 y.o. male who was referred by Dr. Azucena Cecil for evaluation of renal artery stenosis. He has known history of hypertension, hyperlipidemia, sleep apnea on CPAP, obesity, COPD and previous tobacco use. He has difficult to control hypertension and intolerance to some antihypertensive medications.  He reports prolonged history of hypertension that became more difficult to control as he got older. He underwent renal artery duplex which showed mild bilateral renal artery stenosis.  Peak velocity in the right was 216 cm/s and on the left was 222 cm/s. Most recently, he was placed on spironolactone.  His blood pressure has been controlled since then.  He is doing well with no chest pain, shortness of breath or palpitations. He does have sleep apnea but uses CPAP on a regular basis.  Past Medical History:  Diagnosis Date   Abnormal weight gain 03/13/2016   COPD (chronic obstructive pulmonary disease) (HCC)    Hx of malignant neoplasm of prostate 05/31/2012   Hx of tobacco use, presenting hazards to health 04/17/2016   Hypercholesterolemia 03/13/2016   Hyperlipidemia    Hypertension    Hypertensive nephrosclerosis 01/27/2018   Prostate cancer (HCC)    Sleep apnea    uses CPAP    Past Surgical History:  Procedure Laterality Date   COLONOSCOPY WITH PROPOFOL N/A 07/29/2018   Procedure: COLONOSCOPY WITH BIOPSIES;  Surgeon: Midge Minium, MD;  Location: [facility];  Service: Endoscopy;  Laterality: N/A;   POLYPECTOMY N/A 07/29/2018   Procedure: POLYPECTOMY;  Surgeon: Midge Minium, MD;  Location: [facility];  Service: Endoscopy;  Laterality:  N/A;   PROSTATE SURGERY       Current Outpatient Medications  Medication Sig Dispense Refill   allopurinol (ZYLOPRIM) 100 MG tablet TAKE 1 TABLET BY MOUTH EVERY DAY 90 tablet 0   amLODipine-benazepril (LOTREL) 10-40 MG capsule TAKE 1 CAPSULE DAILY 90 capsule 1   bisoprolol (ZEBETA) 10 MG tablet Take 1 tablet (10 mg total) by mouth daily. 90 tablet 0   chlorthalidone (HYGROTON) 50 MG tablet TAKE 1 TABLET DAILY (DOSE   INCREASE) 90 tablet 1   Cholecalciferol (VITAMIN D-3) 1000 units CAPS Take 1 capsule (1,000 Units total) by mouth daily.     indomethacin (INDOCIN) 25 MG capsule At first sign of pain/gout flare take 50 mg PO TID for 2 to 3 days and then decrease to 25 mg po TID x 2 d 60 capsule 1   Omega-3 Fatty Acids (FISH OIL) 1000 MG CAPS Take 1,000 mg by mouth daily.     pravastatin (PRAVACHOL) 40 MG tablet TAKE 1 TABLET AT BEDTIME 90 tablet 1   spironolactone (ALDACTONE) 50 MG tablet Take 1 tablet (50 mg total) by mouth daily. 30 tablet 10   No current facility-administered medications for this visit.    Allergies:   Atorvastatin    Social History:  The patient  reports that he quit smoking about 8 years ago. His smoking use included cigarettes. He has a 40.00 pack-year smoking history. He has never used smokeless tobacco. He reports current alcohol use of about 16.0 standard drinks of alcohol per week. He  reports that he does not use drugs.   Family History:  The patient's family history includes Aneurysm (age of onset: 67) in his mother; Cancer in his brother; Heart attack in his father; Heart disease in his paternal grandfather; Heart failure in his brother; Hypertension in his brother, brother, father, mother, sister, and sister.    ROS:  Please see the history of present illness.   Otherwise, review of systems are positive for none.   All other systems are reviewed and negative.    PHYSICAL EXAM: VS:  BP 138/60 (BP Location: Left Arm, Patient Position: Sitting, Cuff Size:  Normal)   Pulse 75   Ht 5\' 7"  (1.702 m)   Wt 237 lb 2 oz (107.6 kg)   SpO2 97%   BMI 37.14 kg/m  , BMI Body mass index is 37.14 kg/m. GEN: Well nourished, well developed, in no acute distress  HEENT: normal  Neck: no JVD, carotid bruits, or masses Cardiac: RRR; no murmurs, rubs, or gallops,no edema  Respiratory:  clear to auscultation bilaterally, normal work of breathing GI: soft, nontender, nondistended, + BS MS: no deformity or atrophy  Skin: warm and dry, no rash Neuro:  Strength and sensation are intact Psych: euthymic mood, full affect   EKG:  EKG is not ordered today.    Recent Labs: 11/05/2021: Hemoglobin 11.1; Platelets 241 11/12/2021: ALT 9 11/14/2021: BUN 21; Creatinine, Ser 1.08; Potassium 4.2; Sodium 141    Lipid Panel    Component Value Date/Time   CHOL 149 11/05/2021 0939   CHOL 139 04/18/2015 0823   TRIG 78 11/05/2021 0939   HDL 62 11/05/2021 0939   HDL 46 04/18/2015 0823   CHOLHDL 2.4 11/05/2021 0939   VLDL 46 (H) 04/15/2017 0921   LDLCALC 71 11/05/2021 0939      Wt Readings from Last 3 Encounters:  03/07/22 237 lb 2 oz (107.6 kg)  01/29/22 240 lb (108.9 kg)  12/19/21 240 lb (108.9 kg)         03/07/2022    2:45 PM  PAD Screen  Previous PAD dx? No  Previous surgical procedure? No  Pain with walking? Yes  Subsides with rest? Yes  Feet/toe relief with dangling? No  Painful, non-healing ulcers? No  Extremities discolored? No      ASSESSMENT AND PLAN:  1.  Refractory hypertension: The patient's renal artery stenosis seems to be mild overall and does not explain degree of hypertension.  I do not think renal artery stenosis is the cause of elevated blood pressure.  I suspect that she has essential hypertension.  Fortunately, his blood pressure is now well controlled even though he is on 5 antihypertensive medications.  No indication for renal artery angiography at the present time.  He might be a candidate for renal denervation and will see if  he meets inclusion criteria for the ReCor renal denervation study.  2.  Sleep apnea: He reports compliance with CPAP.  3.  Hyperlipidemia: Currently on pravastatin 40 mg daily.  Most recent lipid profile showed an LDL of 71.    Disposition:   FU with me as needed  Signed,  Lorine Bears, MD  03/07/2022 2:49 PM    [facility]

## 2022-03-20 ENCOUNTER — Encounter: Payer: Self-pay | Admitting: Cardiology

## 2022-03-20 ENCOUNTER — Ambulatory Visit: Payer: Medicare HMO | Admitting: Cardiology

## 2022-03-20 VITALS — BP 130/62 | HR 78 | Ht 67.0 in | Wt 238.1 lb

## 2022-03-20 DIAGNOSIS — E7800 Pure hypercholesterolemia, unspecified: Secondary | ICD-10-CM | POA: Diagnosis not present

## 2022-03-20 DIAGNOSIS — Z6837 Body mass index (BMI) 37.0-37.9, adult: Secondary | ICD-10-CM

## 2022-03-20 DIAGNOSIS — I10 Essential (primary) hypertension: Secondary | ICD-10-CM | POA: Diagnosis not present

## 2022-03-20 NOTE — Progress Notes (Signed)
Cardiology Office Note:    Date:  03/20/2022   ID:  Todd Hutchinson, DOB May 30, 1951, MRN [id number]  PCP:  Almon Register, PA-C   [facility] Providers Cardiologist:  None     Referring MD: Delsa Grana, PA-C   Chief Complaint  Patient presents with   3 month follow up     "Doing well." Medications reviewed by the patient verbally.     History of Present Illness:    Todd Hutchinson is a 71 y.o. male with a hx of hypertension, hyperlipidemia, COPD, former smoker x40+ years who presents for follow-up.    Patient being seen for hypertension and medication management.  Renal arterial ultrasound was obtained to evaluate renal artery stenosis.  Aldactone previously started, blood pressures have improved.  Compliant with medications as prescribed.  Feels well, has no concerns at this time.  Prior notes  Renal artery ultrasound 01/2022 mild RAS, 1 to 59% previously was on carvedilol but did not tolerate due to abdominal discomfort and diarrhea.     Past Medical History:  Diagnosis Date   Abnormal weight gain 03/13/2016   COPD (chronic obstructive pulmonary disease) (HCC)    Hx of malignant neoplasm of prostate 05/31/2012   Hx of tobacco use, presenting hazards to health 04/17/2016   Hypercholesterolemia 03/13/2016   Hyperlipidemia    Hypertension    Hypertensive nephrosclerosis 01/27/2018   Prostate cancer ([place])    Sleep apnea    uses CPAP    Past Surgical History:  Procedure Laterality Date   COLONOSCOPY WITH PROPOFOL N/A 07/29/2018   Procedure: COLONOSCOPY WITH BIOPSIES;  Surgeon: Lucilla Lame, MD;  Location: [place];  Service: Endoscopy;  Laterality: N/A;   POLYPECTOMY N/A 07/29/2018   Procedure: POLYPECTOMY;  Surgeon: Lucilla Lame, MD;  Location: [place];  Service: Endoscopy;  Laterality: N/A;   PROSTATE SURGERY      Current Medications: Current Meds  Medication Sig   allopurinol (ZYLOPRIM) 100 MG tablet TAKE 1 TABLET BY MOUTH EVERY  DAY   amLODipine-benazepril (LOTREL) 10-40 MG capsule TAKE 1 CAPSULE DAILY   bisoprolol (ZEBETA) 10 MG tablet Take 1 tablet (10 mg total) by mouth daily.   chlorthalidone (HYGROTON) 50 MG tablet TAKE 1 TABLET DAILY (DOSE   INCREASE)   Cholecalciferol (VITAMIN D-3) 1000 units CAPS Take 1 capsule (1,000 Units total) by mouth daily.   indomethacin (INDOCIN) 25 MG capsule At first sign of pain/gout flare take 50 mg PO TID for 2 to 3 days and then decrease to 25 mg po TID x 2 d   Omega-3 Fatty Acids (FISH OIL) 1000 MG CAPS Take 1,000 mg by mouth daily.   pravastatin (PRAVACHOL) 40 MG tablet TAKE 1 TABLET AT BEDTIME   spironolactone (ALDACTONE) 50 MG tablet Take 1 tablet (50 mg total) by mouth daily.     Allergies:   Atorvastatin   Social History   Socioeconomic History   Marital status: Married    Spouse name: Dina Rich    Number of children: 1   Years of education: Not on file   Highest education level: 12th grade  Occupational History    Comment: Retired   Tobacco Use   Smoking status: Former    Packs/day: 1.00    Years: 40.00    Total pack years: 40.00    Types: Cigarettes    Quit date: 11/2013    Years since quitting: 8.3   Smokeless tobacco: Never  Vaping Use   Vaping Use: Never used  Substance and Sexual Activity   Alcohol use: Yes    Alcohol/week: 16.0 standard drinks of alcohol    Types: 16 Shots of liquor per week    Comment: no more than 4 whiskey drinks a day; maybe 4 times day    Drug use: No   Sexual activity: Never    Partners: Female  Other Topics Concern   Not on file  Social History Narrative   Not on file   Social Determinants of Health   Financial Resource Strain: Low Risk  (07/02/2018)   Overall Financial Resource Strain (CARDIA)    Difficulty of Paying Living Expenses: Not hard at all  Food Insecurity: No Food Insecurity (07/02/2018)   Hunger Vital Sign    Worried About Running Out of Food in the Last Year: Never true    [place] in the Last  Year: Never true  Transportation Needs: No Transportation Needs (04/02/2020)   PRAPARE - Hydrologist (Medical): No    Lack of Transportation (Non-Medical): No  Physical Activity: Inactive (07/05/2019)   Exercise Vital Sign    Days of Exercise per Week: 0 days    Minutes of Exercise per Session: 0 min  Stress: No Stress Concern Present (07/02/2018)   [place]    Feeling of Stress : Not at all  Social Connections: Moderately Integrated (07/02/2018)   Social Connection and Isolation Panel [NHANES]    Frequency of Communication with Friends and Family: More than three times a week    Frequency of Social Gatherings with Friends and Family: Once a week    Attends Religious Services: More than 4 times per year    Active Member of [employer] or Organizations: No    Attends Music therapist: Never    Marital Status: Married     Family History: The patient's family history includes Aneurysm (age of onset: 20) in his mother; Cancer in his brother; Heart attack in his father; Heart disease in his paternal grandfather; Heart failure in his brother; Hypertension in his brother, brother, father, mother, sister, and sister. There is no history of Kidney cancer, Kidney failure, Prostate cancer, Sickle cell anemia, or Tuberculosis.  ROS:   Please see the history of present illness.     All other systems reviewed and are negative.  EKGs/Labs/Other Studies Reviewed:    The following studies were reviewed today:   EKG:  EKG is ordered today.  EKG shows normal sinus rhythm  Recent Labs: 11/05/2021: Hemoglobin 11.1; Platelets 241 11/12/2021: ALT 9 11/14/2021: BUN 21; Creatinine, Ser 1.08; Potassium 4.2; Sodium 141  Recent Lipid Panel    Component Value Date/Time   CHOL 149 11/05/2021 0939   CHOL 139 04/18/2015 0823   TRIG 78 11/05/2021 0939   HDL 62 11/05/2021 0939   HDL 46 04/18/2015 0823    CHOLHDL 2.4 11/05/2021 0939   VLDL 46 (H) 04/15/2017 0921   LDLCALC 71 11/05/2021 0939     Risk Assessment/Calculations:          Physical Exam:    VS:  BP 130/62 (BP Location: Left Arm, Patient Position: Sitting, Cuff Size: Large)   Pulse 78   Ht '5\' 7"'$  (1.702 m)   Wt 238 lb 2 oz (108 kg)   SpO2 96%   BMI 37.30 kg/m     Wt Readings from Last 3 Encounters:  03/20/22 238 lb 2 oz (108 kg)  03/07/22 237 lb  2 oz (107.6 kg)  01/29/22 240 lb (108.9 kg)     GEN:  Well nourished, well developed in no acute distress HEENT: Normal NECK: No JVD; No carotid bruits CARDIAC: RRR, no murmurs, rubs, gallops RESPIRATORY:  Clear to auscultation without rales, wheezing or rhonchi  ABDOMEN: Soft, non-tender, non-distended MUSCULOSKELETAL:  No edema; No deformity  SKIN: Warm and dry NEUROLOGIC:  Alert and oriented x 3 PSYCHIATRIC:  Normal affect   ASSESSMENT:    1. Primary hypertension   2. Pure hypercholesterolemia   3. BMI 37.0-37.9, adult    PLAN:    In order of problems listed above:  Hypertension, BP now controlled.  Cont Aldactone 50 mg daily, bisoprolol to 10 mg daily, chlorthalidone 50, Lotrel 10-'40mg'$  qd as prescribed.  Renal artery ultrasound showed mild nonobstructive stenosis.  Hopefully with weight loss, increase exercise/activity blood pressure  improves and we can cut back some of his BP meds. Hyperlipidemia, cholesterol controlled, continue Pravachol Obesity, weight loss, exercise recommended.  Follow-up in 6 months.     Medication Adjustments/Labs and Tests Ordered: Current medicines are reviewed at length with the patient today.  Concerns regarding medicines are outlined above.  Orders Placed This Encounter  Procedures   EKG 12-Lead    No orders of the defined types were placed in this encounter.    Patient Instructions  Medication Instructions:  Your physician recommends that you continue on your current medications as directed. Please refer to the  Current Medication list given to you today.  *If you need a refill on your cardiac medications before your next appointment, please call your pharmacy*   Follow-Up: At [facility], you and your health needs are our priority.  As part of our continuing mission to provide you with exceptional heart care, we have created designated Provider Care Teams.  These Care Teams include your primary Cardiologist (physician) and Advanced Practice Providers (APPs -  Physician Assistants and Nurse Practitioners) who all work together to provide you with the care you need, when you need it.  We recommend signing up for the patient portal called "MyChart".  Sign up information is provided on this After Visit Summary.  MyChart is used to connect with patients for Virtual Visits (Telemedicine).  Patients are able to view lab/test results, encounter notes, upcoming appointments, etc.  Non-urgent messages can be sent to your provider as well.   To learn more about what you can do with MyChart, go to [email or web address].    Your next appointment:   6 month(s)  The format for your next appointment:   In Person  Provider:   You may see Kate Sable, MD or one of the following Advanced Practice Providers on your designated Care Team:   Murray Hodgkins, NP Christell Faith, PA-C Cadence Kathlen Mody, [place]    Other Instructions   Important Information About Sugar         Signed, Kate Sable, MD  03/20/2022 12:22 PM    [place]

## 2022-03-20 NOTE — Patient Instructions (Signed)
Medication Instructions:  Your physician recommends that you continue on your current medications as directed. Please refer to the Current Medication list given to you today.  *If you need a refill on your cardiac medications before your next appointment, please call your pharmacy*   Follow-Up: At [facility], you and your health needs are our priority.  As part of our continuing mission to provide you with exceptional heart care, we have created designated Provider Care Teams.  These Care Teams include your primary Cardiologist (physician) and Advanced Practice Providers (APPs -  Physician Assistants and Nurse Practitioners) who all work together to provide you with the care you need, when you need it.  We recommend signing up for the patient portal called "MyChart".  Sign up information is provided on this After Visit Summary.  MyChart is used to connect with patients for Virtual Visits (Telemedicine).  Patients are able to view lab/test results, encounter notes, upcoming appointments, etc.  Non-urgent messages can be sent to your provider as well.   To learn more about what you can do with MyChart, go to [email or web address].    Your next appointment:   6 month(s)  The format for your next appointment:   In Person  Provider:   You may see Kate Sable, MD or one of the following Advanced Practice Providers on your designated Care Team:   Murray Hodgkins, NP Christell Faith, PA-C Cadence Kathlen Mody, [place]    Other Instructions   Important Information About Sugar

## 2022-04-08 ENCOUNTER — Other Ambulatory Visit: Payer: Self-pay | Admitting: Physician Assistant

## 2022-04-08 DIAGNOSIS — I10 Essential (primary) hypertension: Secondary | ICD-10-CM

## 2022-04-09 NOTE — Telephone Encounter (Signed)
Requested Prescriptions  Pending Prescriptions Disp Refills  . chlorthalidone (HYGROTON) 50 MG tablet [Pharmacy Med Name: CHLORTHALID  TAB 50MG] 90 tablet 0    Sig: TAKE 1 TABLET DAILY (DOSE   INCREASE)     Cardiovascular: Diuretics - Thiazide Passed - 04/08/2022 12:42 PM      Passed - Cr in normal range and within 180 days    Creat  Date Value Ref Range Status  11/12/2021 1.29 0.70 - 1.35 mg/dL Final   Creatinine, Ser  Date Value Ref Range Status  11/14/2021 1.08 0.76 - 1.27 mg/dL Final   Creatinine, Urine  Date Value Ref Range Status  07/29/2017 88 20 - 320 mg/dL Final         Passed - K in normal range and within 180 days    Potassium  Date Value Ref Range Status  11/14/2021 4.2 3.5 - 5.2 mmol/L Final         Passed - Na in normal range and within 180 days    Sodium  Date Value Ref Range Status  11/14/2021 141 134 - 144 mmol/L Final         Passed - Last BP in normal range    BP Readings from Last 1 Encounters:  03/20/22 130/62         Passed - Valid encounter within last 6 months    Recent Outpatient Visits          2 months ago Benign essential hypertension   [place], Erin E, PA-C   5 months ago Anemia, unspecified type   [place], Erin E, PA-C   7 months ago Chronic gout of foot, unspecified cause, unspecified laterality   [facility] Delsa Grana, PA-C   11 months ago Essential hypertension   [facility] Delsa Grana, PA-C   1 year ago Essential hypertension   [facility] Delsa Grana, PA-C      Future Appointments            In 3 months Delsa Grana, PA-C [facility] [place]           . amLODipine-benazepril (LOTREL) 10-40 MG capsule [Pharmacy Med Name: AMLOD/BENAZP CAP 10-40MG] 90 capsule 0    Sig: TAKE 1 CAPSULE DAILY     Cardiovascular: CCB + ACEI Combos Passed - 04/08/2022 12:42 PM      Passed - Cr in normal  range and within 180 days    Creat  Date Value Ref Range Status  11/12/2021 1.29 0.70 - 1.35 mg/dL Final   Creatinine, Ser  Date Value Ref Range Status  11/14/2021 1.08 0.76 - 1.27 mg/dL Final   Creatinine, Urine  Date Value Ref Range Status  07/29/2017 88 20 - 320 mg/dL Final         Passed - K in normal range and within 180 days    Potassium  Date Value Ref Range Status  11/14/2021 4.2 3.5 - 5.2 mmol/L Final         Passed - Na in normal range and within 180 days    Sodium  Date Value Ref Range Status  11/14/2021 141 134 - 144 mmol/L Final         Passed - eGFR is 30 or above and within 180 days    GFR, Est African American  Date Value Ref Range Status  10/11/2020 72 > OR = 60 mL/min/1.35m Final   GFR, Est Non  African American  Date Value Ref Range Status  10/11/2020 62 > OR = 60 mL/min/1.27m Final   eGFR  Date Value Ref Range Status  11/14/2021 74 >59 mL/min/1.73 Final         Passed - Patient is not pregnant      Passed - Last BP in normal range    BP Readings from Last 1 Encounters:  03/20/22 130/62         Passed - Valid encounter within last 6 months    Recent Outpatient Visits          2 months ago Benign essential hypertension   [place], PA-C   5 months ago Anemia, unspecified type   [place] PA-C   7 months ago [facility] g[facility] of foot, unspecified cause, unspecified laterality   [facility]   11 months ago Essential hypertension   [facility]   1 year ago Essential hypertension   [facility]      Future Appointments            In 3 months [facility]          . pravastatin (PRAVACHOL) 40 MG tablet [Pharmacy Med Name: PRAVASTATIN  TAB 40MG] 90 tablet 0    Sig: TAKE 1 TABLET AT BEDTIME      Cardiovascular:  Antilipid - Statins Failed - 04/08/2022 12:42 PM      Failed - Lipid Panel in normal range within the last 12 months    Cholesterol, Total  Date Value Ref Range Status  04/18/2015 139 100 - 199 mg/dL Final   Cholesterol  Date Value Ref Range Status  11/05/2021 149 <200 mg/dL Final   LDL Cholesterol (Calc)  Date Value Ref Range Status  11/05/2021 71 mg/dL (calc) Final    Comment:    Reference range: <100 . Desirable range <100 mg/dL for primary prevention;   <70 mg/dL for patients with CHD or diabetic patients  with > or = 2 CHD risk factors. .Marland KitchenLDL-C is now calculated using the Martin-Hopkins  calculation, which is a validated novel method providing  better accuracy than the Friedewald equation in the  estimation of LDL-C.  MCresenciano Genreet al. JAnnamaria Helling 28182;993(71: 2061-2068  (http://education.[employer].com/faq/FAQ164)    HDL  Date Value Ref Range Status  11/05/2021 62 > OR = 40 mg/dL Final  04/18/2015 46 >39 mg/dL Final    Comment:    According to ATP-III Guidelines, HDL-C >59 mg/dL is considered a negative risk factor for CHD.    Triglycerides  Date Value Ref Range Status  11/05/2021 78 <150 mg/dL Final         Passed - Patient is not pregnant      Passed - Valid encounter within last 12 months    Recent Outpatient Visits          2 months ago Benign essential hypertension   [place]   5 months ago Anemia, unspecified type   [place]   7 months ago Chronic gout of foot, unspecified cause, unspecified laterality   [facility]   11 months ago Essential hypertension   [facility]   1 year ago Essential hypertension  [facility] Delsa Grana, [facility]-C      Future Appointments            In 3 months Delsa Grana, PA-C [facility], [facility], [place]

## 2022-04-24 ENCOUNTER — Other Ambulatory Visit: Payer: Self-pay

## 2022-04-24 MED ORDER — BISOPROLOL FUMARATE 10 MG PO TABS: 10.0000 mg | ORAL_TABLET | Freq: Every day | ORAL | 3 refills | Status: DC

## 2022-04-25 ENCOUNTER — Other Ambulatory Visit: Payer: Self-pay | Admitting: Family Medicine

## 2022-04-25 ENCOUNTER — Other Ambulatory Visit: Payer: Medicare HMO

## 2022-04-25 DIAGNOSIS — M1A0790 Idiopathic chronic gout, unspecified ankle and foot, without tophus (tophi): Secondary | ICD-10-CM

## 2022-04-25 NOTE — Telephone Encounter (Signed)
Requested Prescriptions  Pending Prescriptions Disp Refills  . allopurinol (ZYLOPRIM) 100 MG tablet [Pharmacy Med Name: ALLOPURINOL 100 MG TABLET] 90 tablet 0    Sig: TAKE 1 TABLET BY MOUTH EVERY DAY     Endocrinology:  Gout Agents - allopurinol Passed - 04/25/2022  2:17 AM      Passed - Uric Acid in normal range and within 360 days    Uric Acid, Serum  Date Value Ref Range Status  11/05/2021 8.0 4.0 - 8.0 mg/dL Final    Comment:    Therapeutic target for gout patients: <6.0 mg/dL .          Passed - Cr in normal range and within 360 days    Creat  Date Value Ref Range Status  11/12/2021 1.29 0.70 - 1.35 mg/dL Final   Creatinine, Ser  Date Value Ref Range Status  11/14/2021 1.08 0.76 - 1.27 mg/dL Final   Creatinine, Urine  Date Value Ref Range Status  07/29/2017 88 20 - 320 mg/dL Final         Passed - Valid encounter within last 12 months    Recent Outpatient Visits          2 months ago Benign essential hypertension   [facility] Mecum, Erin E, PA-C   5 months ago Anemia, unspecified type   [facility] Mecum, Erin E, PA-C   8 months ago Chronic gout of foot, unspecified cause, unspecified laterality   [facility] Delsa Grana, PA-C   12 months ago Essential hypertension   [facility] Delsa Grana, PA-C   1 year ago Essential hypertension   [facility] Delsa Grana, PA-C      Future Appointments            In 3 months Delsa Grana, PA-C [facility], [place] within normal limits and completed in the last 12 months    WBC  Date Value Ref Range Status  11/05/2021 8.9 3.8 - 10.8 Thousand/uL Final   RBC  Date Value Ref Range Status  11/05/2021 4.18 (L) 4.20 - 5.80 Million/uL Final   Hemoglobin  Date Value Ref Range Status  11/05/2021 11.1 (L) 13.2 - 17.1 g/dL Final  04/18/2015 10.8 (L) 12.6 - 17.7 g/dL Final   HCT   Date Value Ref Range Status  11/05/2021 35.6 (L) 38.5 - 50.0 % Final   Hematocrit  Date Value Ref Range Status  04/18/2015 33.0 (L) 37.5 - 51.0 % Final   MCHC  Date Value Ref Range Status  11/05/2021 31.2 (L) 32.0 - 36.0 g/dL Final   [facility]  Date Value Ref Range Status  11/05/2021 26.6 (L) 27.0 - 33.0 pg Final   MCV  Date Value Ref Range Status  11/05/2021 85.2 80.0 - 100.0 fL Final  04/18/2015 78 (L) 79 - 97 fL Final  11/03/2013 78 (L) 80 - 100 fL Final   No results found for: "PLTCOUNTKUC", "LABPLAT", "POCPLA" RDW  Date Value Ref Range Status  11/05/2021 15.4 (H) 11.0 - 15.0 % Final  04/18/2015 16.1 (H) 12.3 - 15.4 % Final  11/03/2013 18.7 (H) 11.5 - 14.5 % Final

## 2022-04-30 ENCOUNTER — Ambulatory Visit: Payer: Medicare HMO | Admitting: Urology

## 2022-05-01 ENCOUNTER — Other Ambulatory Visit: Payer: Medicare HMO

## 2022-05-01 DIAGNOSIS — C61 Malignant neoplasm of prostate: Secondary | ICD-10-CM | POA: Diagnosis not present

## 2022-05-02 LAB — PSA: Prostate Specific Ag, Serum: 0.1 ng/mL (ref 0.0–4.0)

## 2022-05-27 DIAGNOSIS — G4733 Obstructive sleep apnea (adult) (pediatric): Secondary | ICD-10-CM | POA: Diagnosis not present

## 2022-05-29 ENCOUNTER — Encounter: Payer: Self-pay | Admitting: Urology

## 2022-05-29 ENCOUNTER — Ambulatory Visit: Payer: Medicare HMO | Admitting: Urology

## 2022-05-29 VITALS — BP 155/72 | HR 76 | Ht 67.0 in | Wt 238.0 lb

## 2022-05-29 DIAGNOSIS — Z8546 Personal history of malignant neoplasm of prostate: Secondary | ICD-10-CM

## 2022-05-29 DIAGNOSIS — C61 Malignant neoplasm of prostate: Secondary | ICD-10-CM

## 2022-05-29 NOTE — Progress Notes (Signed)
   05/29/2022 1:09 PM   Todd Hutchinson July 05, 1951 [id number]  Reason for visit: Follow up prostate cancer  HPI: Todd Hutchinson is a 71 year old African-American male previously followed by Dr. Jacqlyn Larsen with history of open radical prostatectomy in 2000 with final pathology showing Gleason score 3+4=7 with negative margins and no extraprostatic extension, salvage radiation in 2006 for reported biochemical recurrence, and long-term Lupron androgen deprivation therapy.  His PSA trend since 2014 had remained essentially undetectable on Lupron from 0-0.2.   At our visit in January 2020 his PSA remained stable at 0.2 from 0.2 after being off of Lupron for 9 months when he transitioned from Dr. Jacqlyn Larsen to our practice at [facility].  We discussed the risks and benefits of stopping Lupron and continuing to monitor the PSA, and he agreed to stop Lupron.  His repeat PSA on 07/12/2019 was stable at 0.2 from 0.2 previously.  At our last visit in May 2022 I recommended continuing to closely monitor the PSA, his PSA had increased slightly to 0.5 from 0.2.  He has not followed up since that time.  Fortunately, he has continued to have PSAs checked, and these have returned to normal.  PSA was 0.6 in August 2022, 0.4 in February 2023, and 0.1 in August 2023.  He remains off Lupron.  He denies any new complaints and continues to feel better, significantly improved from when he previously was on ADT.  We again reviewed the risk of recurrence and need to continue to monitor the PSA, but I think with his PSA being 0.1 most recently off Lupron, which is actually lower than the 0.2 when he was on the Lupron, I would strongly recommend continuing PSA monitoring alone, especially with significant side effect profile from ADT.  He is in agreement.  RTC 1 year PSA prior    Billey Co, MD  [facility] [address] [place], [place] [address] [phone]

## 2022-06-28 ENCOUNTER — Other Ambulatory Visit: Payer: Self-pay | Admitting: Physician Assistant

## 2022-06-28 DIAGNOSIS — I10 Essential (primary) hypertension: Secondary | ICD-10-CM

## 2022-06-30 NOTE — Telephone Encounter (Signed)
Requested Prescriptions  Pending Prescriptions Disp Refills  . chlorthalidone (HYGROTON) 50 MG tablet [Pharmacy Med Name: CHLORTHALID  TAB 50MG] 90 tablet 0    Sig: TAKE 1 TABLET DAILY (DOSE   INCREASE)     Cardiovascular: Diuretics - Thiazide Failed - 06/28/2022  8:52 PM      Failed - Cr in normal range and within 180 days    Creat  Date Value Ref Range Status  11/12/2021 1.29 0.70 - 1.35 mg/dL Final   Creatinine, Ser  Date Value Ref Range Status  11/14/2021 1.08 0.76 - 1.27 mg/dL Final   Creatinine, Urine  Date Value Ref Range Status  07/29/2017 88 20 - 320 mg/dL Final         Failed - K in normal range and within 180 days    Potassium  Date Value Ref Range Status  11/14/2021 4.2 3.5 - 5.2 mmol/L Final         Failed - Na in normal range and within 180 days    Sodium  Date Value Ref Range Status  11/14/2021 141 134 - 144 mmol/L Final         Failed - Last BP in normal range    BP Readings from Last 1 Encounters:  05/29/22 (!) 155/72         Passed - Valid encounter within last 6 months    Recent Outpatient Visits          5 months ago Benign essential hypertension   [place], Erin E, PA-C   8 months ago Anemia, unspecified type   [place], PA-C   10 months ago Chronic gout of foot, unspecified cause, unspecified laterality   [facility] Delsa Grana, PA-C   1 year ago Essential hypertension   [facility] Delsa Grana, PA-C   1 year ago Essential hypertension   [facility] Delsa Grana, PA-C      Future Appointments            In 1 month Delsa Grana, PA-C [facility], [place]   In 11 months Diamantina [place], Herbert Seta, MD [place]           . pravastatin (PRAVACHOL) 40 MG tablet [Pharmacy Med Name: PRAVASTATIN  TAB 40MG] 90 tablet 2    Sig: TAKE 1 TABLET AT BEDTIME     Cardiovascular:  Antilipid -  Statins Failed - 06/28/2022  8:52 PM      Failed - Lipid Panel in normal range within the last 12 months    Cholesterol, Total  Date Value Ref Range Status  04/18/2015 139 100 - 199 mg/dL Final   Cholesterol  Date Value Ref Range Status  11/05/2021 149 <200 mg/dL Final   LDL Cholesterol (Calc)  Date Value Ref Range Status  11/05/2021 71 mg/dL (calc) Final    Comment:    Reference range: <100 . Desirable range <100 mg/dL for primary prevention;   <70 mg/dL for patients with CHD or diabetic patients  with > or = 2 CHD risk factors. Marland Kitchen LDL-C is now calculated using the Martin-Hopkins  calculation, which is a validated novel method providing  better accuracy than the Friedewald equation in the  estimation of LDL-C.  Cresenciano Genre et al. Annamaria Helling. 8032;122(48): 2061-2068  (http://education.[employer].com/faq/FAQ164)    HDL  Date Value Ref Range Status  11/05/2021 62 > OR = 40 mg/dL Final  04/18/2015 46 >39  mg/dL Final    Comment:    According to ATP-III Guidelines, HDL-C >59 mg/dL is considered a negative risk factor for CHD.    Triglycerides  Date Value Ref Range Status  11/05/2021 78 <150 mg/dL Final         Passed - Patient is not pregnant      Passed - Valid encounter within last 12 months    Recent Outpatient Visits          5 months ago Benign essential hypertension   [place], [place], PA-C   8 months ago Anemia, unspecified type   [place], PA-C   10 months ago Chronic gout of foot, unspecified cause, unspecified laterality   [facility] Delsa Grana, PA-C   1 year ago Essential hypertension   [facility] Delsa Grana, PA-C   1 year ago Essential hypertension   [facility] Delsa Grana, PA-C      Future Appointments            In 1 month Delsa Grana, PA-C [place], [place]   In 11 months Diamantina [place], Herbert Seta, MD  [place]           . amLODipine-benazepril (LOTREL) 10-40 MG capsule [Pharmacy Med Name: AMLOD/BENAZP CAP 10-40MG] 90 capsule 0    Sig: TAKE 1 CAPSULE DAILY     Cardiovascular: CCB + ACEI Combos Failed - 06/28/2022  8:52 PM      Failed - Cr in normal range and within 180 days    Creat  Date Value Ref Range Status  11/12/2021 1.29 0.70 - 1.35 mg/dL Final   Creatinine, Ser  Date Value Ref Range Status  11/14/2021 1.08 0.76 - 1.27 mg/dL Final   Creatinine, Urine  Date Value Ref Range Status  07/29/2017 88 20 - 320 mg/dL Final         Failed - K in normal range and within 180 days    Potassium  Date Value Ref Range Status  11/14/2021 4.2 3.5 - 5.2 mmol/L Final         Failed - Na in normal range and within 180 days    Sodium  Date Value Ref Range Status  11/14/2021 141 134 - 144 mmol/L Final         Failed - eGFR is 30 or above and within 180 days    GFR, Est African American  Date Value Ref Range Status  10/11/2020 72 > OR = 60 mL/min/1.49m Final   GFR, Est Non African American  Date Value Ref Range Status  10/11/2020 62 > OR = 60 mL/min/1.754mFinal   eGFR  Date Value Ref Range Status  11/14/2021 74 >59 mL/min/1.73 Final         Failed - Last BP in normal range    BP Readings from Last 1 Encounters:  05/29/22 (!) 155/72         Passed - Patient is not pregnant      Passed - Valid encounter within last 6 months    Recent Outpatient Visits          5 months ago Benign essential hypertension   [facility]   8 months ago Anemia, unspecified type   [facility]   10 months ago Chronic gout of foot, unspecified cause, unspecified laterality   [facility] Delsa Grana, PA-C   1 year ago Essential hypertension   [facility] Delsa Grana, PA-C   1 year ago Essential hypertension   [facility] Delsa Grana, PA-C      Future Appointments            In 1 month Delsa Grana, PA-C [facility], [place]   In 11 months Diamantina Providence, Herbert Seta, MD [place]

## 2022-07-24 ENCOUNTER — Other Ambulatory Visit: Payer: Self-pay | Admitting: Internal Medicine

## 2022-07-24 DIAGNOSIS — M1A0790 Idiopathic chronic gout, unspecified ankle and foot, without tophus (tophi): Secondary | ICD-10-CM

## 2022-07-24 NOTE — Telephone Encounter (Signed)
Requested Prescriptions  Pending Prescriptions Disp Refills   allopurinol (ZYLOPRIM) 100 MG tablet [Pharmacy Med Name: ALLOPURINOL 100 MG TABLET] 90 tablet 0    Sig: TAKE 1 TABLET BY MOUTH EVERY DAY     Endocrinology:  Gout Agents - allopurinol Passed - 07/24/2022  2:30 AM      Passed - Uric Acid in normal range and within 360 days    Uric Acid, Serum  Date Value Ref Range Status  11/05/2021 8.0 4.0 - 8.0 mg/dL Final    Comment:    Therapeutic target for gout patients: <6.0 mg/dL .          Passed - Cr in normal range and within 360 days    Creat  Date Value Ref Range Status  11/12/2021 1.29 0.70 - 1.35 mg/dL Final   Creatinine, Ser  Date Value Ref Range Status  11/14/2021 1.08 0.76 - 1.27 mg/dL Final   Creatinine, Urine  Date Value Ref Range Status  07/29/2017 88 20 - 320 mg/dL Final         Passed - Valid encounter within last 12 months    Recent Outpatient Visits           5 months ago Benign essential hypertension   [facility] Mecum, Erin E, PA-C   8 months ago Anemia, unspecified type   [place], PA-C   11 months ago Chronic gout of foot, unspecified cause, unspecified laterality   [facility] Delsa Grana, PA-C   1 year ago Essential hypertension   [facility] Delsa Grana, PA-C   1 year ago Essential hypertension   [facility] Delsa Grana, PA-C       Future Appointments             In 1 week Delsa Grana, PA-C [facility], [place]   In 10 months Diamantina [place], Herbert Seta, MD [facility] [place]            Passed - CBC within normal limits and completed in the last 12 months    WBC  Date Value Ref Range Status  11/05/2021 8.9 3.8 - 10.8 Thousand/uL Final   RBC  Date Value Ref Range Status  11/05/2021 4.18 (L) 4.20 - 5.80 Million/uL Final   Hemoglobin  Date Value Ref Range Status  11/05/2021 11.1 (L) 13.2 -  17.1 g/dL Final  04/18/2015 10.8 (L) 12.6 - 17.7 g/dL Final   HCT  Date Value Ref Range Status  11/05/2021 35.6 (L) 38.5 - 50.0 % Final   Hematocrit  Date Value Ref Range Status  04/18/2015 33.0 (L) 37.5 - 51.0 % Final   MCHC  Date Value Ref Range Status  11/05/2021 31.2 (L) 32.0 - 36.0 g/dL Final   [facility]  Date Value Ref Range Status  11/05/2021 26.6 (L) 27.0 - 33.0 pg Final   MCV  Date Value Ref Range Status  11/05/2021 85.2 80.0 - 100.0 fL Final  04/18/2015 78 (L) 79 - 97 fL Final  11/03/2013 78 (L) 80 - 100 fL Final   No results found for: "PLTCOUNTKUC", "LABPLAT", "POCPLA" RDW  Date Value Ref Range Status  11/05/2021 15.4 (H) 11.0 - 15.0 % Final  04/18/2015 16.1 (H) 12.3 - 15.4 % Final  11/03/2013 18.7 (H) 11.5 - 14.5 % Final

## 2022-08-01 ENCOUNTER — Ambulatory Visit (INDEPENDENT_AMBULATORY_CARE_PROVIDER_SITE_OTHER): Payer: Medicare HMO | Admitting: Family Medicine

## 2022-08-01 ENCOUNTER — Encounter: Payer: Self-pay | Admitting: Family Medicine

## 2022-08-01 VITALS — BP 148/72 | HR 76 | Temp 98.1°F | Resp 16 | Ht 67.0 in | Wt 238.3 lb

## 2022-08-01 DIAGNOSIS — M1A0790 Idiopathic chronic gout, unspecified ankle and foot, without tophus (tophi): Secondary | ICD-10-CM | POA: Diagnosis not present

## 2022-08-01 DIAGNOSIS — J449 Chronic obstructive pulmonary disease, unspecified: Secondary | ICD-10-CM

## 2022-08-01 DIAGNOSIS — R7303 Prediabetes: Secondary | ICD-10-CM

## 2022-08-01 DIAGNOSIS — D649 Anemia, unspecified: Secondary | ICD-10-CM | POA: Diagnosis not present

## 2022-08-01 DIAGNOSIS — Z6837 Body mass index (BMI) 37.0-37.9, adult: Secondary | ICD-10-CM | POA: Diagnosis not present

## 2022-08-01 DIAGNOSIS — I739 Peripheral vascular disease, unspecified: Secondary | ICD-10-CM | POA: Diagnosis not present

## 2022-08-01 DIAGNOSIS — N1831 Chronic kidney disease, stage 3a: Secondary | ICD-10-CM

## 2022-08-01 DIAGNOSIS — Z87891 Personal history of nicotine dependence: Secondary | ICD-10-CM

## 2022-08-01 DIAGNOSIS — I10 Essential (primary) hypertension: Secondary | ICD-10-CM | POA: Diagnosis not present

## 2022-08-01 DIAGNOSIS — M79604 Pain in right leg: Secondary | ICD-10-CM | POA: Diagnosis not present

## 2022-08-01 DIAGNOSIS — M1A0710 Idiopathic chronic gout, right ankle and foot, without tophus (tophi): Secondary | ICD-10-CM | POA: Diagnosis not present

## 2022-08-01 DIAGNOSIS — Z5181 Encounter for therapeutic drug level monitoring: Secondary | ICD-10-CM | POA: Diagnosis not present

## 2022-08-01 DIAGNOSIS — E785 Hyperlipidemia, unspecified: Secondary | ICD-10-CM | POA: Diagnosis not present

## 2022-08-01 DIAGNOSIS — M79605 Pain in left leg: Secondary | ICD-10-CM

## 2022-08-01 DIAGNOSIS — R6889 Other general symptoms and signs: Secondary | ICD-10-CM

## 2022-08-01 DIAGNOSIS — E66812 Obesity, class 2: Secondary | ICD-10-CM

## 2022-08-01 DIAGNOSIS — Z23 Encounter for immunization: Secondary | ICD-10-CM

## 2022-08-01 NOTE — Assessment & Plan Note (Signed)
Not on inhalers, refuses CT f/up study, denies sx

## 2022-08-01 NOTE — Assessment & Plan Note (Signed)
Last lipids were well controlled on pravastatin and supplements, continue med and continue more diet/lifestyle efforts for overall heart health

## 2022-08-01 NOTE — Assessment & Plan Note (Signed)
monitoring

## 2022-08-01 NOTE — Assessment & Plan Note (Signed)
He refuses low dose CT chest screening for lung CA

## 2022-08-01 NOTE — Assessment & Plan Note (Signed)
Uncontrolled today and with home readings A recent OV with cardiology BP was at goal and there was discussion of possibly being able to decrease meds if he lost weight and improved diet/lifestyle efforts. Initial BP 160-170, home readings 140-150's  BP Readings from Last 3 Encounters:  08/01/22 (!) 148/72  05/29/22 (!) 155/72  03/20/22 130/62   He is maxed out on his current meds, BB, norvasc, spironolactone (may increase?), chlorthalidone, ACEI Could add hydralazine Strongly encouraged pt to start walking a few times a week and monitor home BP Goal to be close to 130/80 or under

## 2022-08-01 NOTE — Assessment & Plan Note (Signed)
Recheck.

## 2022-08-01 NOTE — Progress Notes (Signed)
Name: Todd Hutchinson   MRN: [id number]    DOB: Dec 23, 1950   Date:08/01/2022       Progress Note  Chief Complaint  Patient presents with   Follow-up   Hypertension   Hyperlipidemia   COPD     Subjective:   Todd Hutchinson is a 71 y.o. male, presents to clinic for routine f/up   Activity level sedentary, sits in front of the TV all day per pt and wife, weight unchanged, BP good at [facility] appt in July, very elevated here and with home readings, he is compliant w/ all his meds Wt Readings from Last 5 Encounters:  08/01/22 238 lb 4.8 oz (108.1 kg)  05/29/22 238 lb (108 kg)  03/20/22 238 lb 2 oz (108 kg)  03/07/22 237 lb 2 oz (107.6 kg)  01/29/22 240 lb (108.9 kg)   BMI Readings from Last 5 Encounters:  08/01/22 37.32 kg/m  05/29/22 37.28 kg/m  03/20/22 37.30 kg/m  03/07/22 37.14 kg/m  01/29/22 37.59 kg/m    Lab Results  Component Value Date   LABURIC 8.0 11/05/2021  No gout flare since last christmas, on allopurinol 100 mg once daily  Pain in both thighs when he walks, it improves with resting- occurs consistently to anterior thigh with walking for example in a [employer], the distance from parking lot to back of a [employer] store but perhaps not a smaller grocery store, he has to stop and rest, no back pain, no LE changes (denies pallor, paresthesias, atrophy, wounds, sores)  He has mild LE edema on exam but denies swelling      Current Outpatient Medications:    allopurinol (ZYLOPRIM) 100 MG tablet, TAKE 1 TABLET BY MOUTH EVERY DAY, Disp: 90 tablet, Rfl: 0   amLODipine-benazepril (LOTREL) 10-40 MG capsule, TAKE 1 CAPSULE DAILY, Disp: 90 capsule, Rfl: 0   bisoprolol (ZEBETA) 10 MG tablet, Take 1 tablet (10 mg total) by mouth daily., Disp: 90 tablet, Rfl: 3   chlorthalidone (HYGROTON) 50 MG tablet, TAKE 1 TABLET DAILY (DOSE   INCREASE), Disp: 90 tablet, Rfl: 0   Cholecalciferol (VITAMIN D-3) 1000 units CAPS, Take 1 capsule (1,000 Units total) by  mouth daily., Disp: , Rfl:    indomethacin (INDOCIN) 25 MG capsule, At first sign of pain/gout flare take 50 mg PO TID for 2 to 3 days and then decrease to 25 mg po TID x 2 d, Disp: 60 capsule, Rfl: 1   Omega-3 Fatty Acids (FISH OIL) 1000 MG CAPS, Take 1,000 mg by mouth daily., Disp: , Rfl:    pravastatin (PRAVACHOL) 40 MG tablet, TAKE 1 TABLET AT BEDTIME, Disp: 90 tablet, Rfl: 2   spironolactone (ALDACTONE) 50 MG tablet, Take 1 tablet (50 mg total) by mouth daily., Disp: 30 tablet, Rfl: 10  Patient Active Problem List   Diagnosis Date Noted   Anemia 10/11/2020   Irregular heart rhythm 10/11/2020   Class 2 severe obesity with serious comorbidity and body mass index (BMI) of 38.0 to 38.9 in adult [facility]) 11/02/2018   History of colonic polyps    Benign neoplasm of ascending colon    Benign neoplasm of cecum    Hypertensive nephrosclerosis 01/27/2018   Prediabetes 06/30/2017   COPD (chronic obstructive pulmonary disease) ([place]) 04/15/2017   Hx of tobacco use, presenting hazards to health 04/17/2016   Hyperlipidemia 03/13/2016   Benign essential hypertension 07/10/2015   Stage 3 chronic kidney disease ([place]) 07/10/2015   Cystitis, radiation 05/31/2012   Hematuria, microscopic 05/31/2012  ED (erectile dysfunction) of organic origin 05/31/2012   Hx of malignant neoplasm of prostate 05/31/2012    Past Surgical History:  Procedure Laterality Date   COLONOSCOPY WITH PROPOFOL N/A 07/29/2018   Procedure: COLONOSCOPY WITH BIOPSIES;  Surgeon: Lucilla Lame, MD;  Location: [place];  Service: Endoscopy;  Laterality: N/A;   POLYPECTOMY N/A 07/29/2018   Procedure: POLYPECTOMY;  Surgeon: Lucilla Lame, MD;  Location: [place];  Service: Endoscopy;  Laterality: N/A;   PROSTATE SURGERY      Family History  Problem Relation Age of Onset   Hypertension Mother    Aneurysm Mother 34       brain   Hypertension Father    Heart attack Father    Hypertension Sister    Hypertension  Brother    Heart failure Brother    Heart disease Paternal Grandfather    Hypertension Sister    Hypertension Brother    Cancer Brother        throat cancer   Kidney cancer Neg Hx    Kidney failure Neg Hx    Prostate cancer Neg Hx    Sickle cell anemia Neg Hx    Tuberculosis Neg Hx     Social History   Tobacco Use   Smoking status: Former    Packs/day: 1.00    Years: 40.00    Total pack years: 40.00    Types: Cigarettes    Quit date: 11/2013    Years since quitting: 8.7   Smokeless tobacco: Never  Vaping Use   Vaping Use: Never used  Substance Use Topics   Alcohol use: Yes    Alcohol/week: 16.0 standard drinks of alcohol    Types: 16 Shots of liquor per week    Comment: no more than 4 whiskey drinks a day; maybe 4 times day    Drug use: No     Allergies  Allergen Reactions   Atorvastatin Rash    Health Maintenance  Topic Date Due   Lung Cancer Screening  Never done   Medicare Annual Wellness (AWV)  07/04/2020   COVID-19 Vaccine (3 - Moderna risk series) 08/17/2022 (Originally 01/07/2020)   Zoster Vaccines- Shingrix (1 of 2) 11/01/2022 (Originally 12/08/1970)   COLONOSCOPY (Pts 45-51yr Insurance coverage will need to be confirmed)  07/30/2023   Pneumonia Vaccine 70 Years old  Completed   INFLUENZA VACCINE  Completed   Hepatitis C Screening  Completed   HPV VACCINES  Aged Out    Chart Review Today: I personally reviewed active problem list, medication list, allergies, family history, social history, health maintenance, notes from last encounter, lab results, imaging with the patient/caregiver today.   Review of Systems  Constitutional: Negative.   HENT: Negative.    Eyes: Negative.   Respiratory: Negative.    Cardiovascular: Negative.   Gastrointestinal: Negative.   Endocrine: Negative.   Genitourinary: Negative.   Musculoskeletal: Negative.   Skin: Negative.   Allergic/Immunologic: Negative.   Neurological: Negative.   Hematological: Negative.    Psychiatric/Behavioral: Negative.    All other systems reviewed and are negative.    Objective:   Vitals:   08/01/22 1115 08/01/22 1153  BP: (!) 162/70 (!) 148/72  Pulse: 76   Resp: 16   Temp: 98.1 F (36.7 C)   TempSrc: Oral   SpO2: 92%   Weight: 238 lb 4.8 oz (108.1 kg)   Height: '5\' 7"'$  (1.702 m)     Body mass index is 37.32 kg/m.  Physical Exam Vitals and  nursing note reviewed.  Constitutional:      General: He is not in acute distress.    Appearance: Normal appearance. He is well-developed. He is obese. He is not ill-appearing, toxic-appearing or diaphoretic.  HENT:     Head: Normocephalic and atraumatic.     Nose: Nose normal.  Eyes:     General:        Right eye: No discharge.        Left eye: No discharge.     Conjunctiva/sclera: Conjunctivae normal.  Neck:     Trachea: No tracheal deviation.  Cardiovascular:     Rate and Rhythm: Normal rate and regular rhythm.     Pulses: Normal pulses.     Heart sounds: Normal heart sounds. No murmur heard.    No friction rub. No gallop.  Pulmonary:     Effort: Pulmonary effort is normal. No respiratory distress.     Breath sounds: Normal breath sounds. No stridor. No wheezing, rhonchi or rales.  Abdominal:     General: Bowel sounds are normal. There is no distension.     Palpations: Abdomen is soft.  Musculoskeletal:        General: Normal range of motion.     Right lower leg: Edema present.     Left lower leg: Edema present.  Skin:    General: Skin is warm and dry.     Findings: No lesion or rash.  Neurological:     Mental Status: He is alert. Mental status is at baseline.     Motor: No abnormal muscle tone.     Coordination: Coordination normal.     Gait: Gait normal.  Psychiatric:        Mood and Affect: Mood normal.        Behavior: Behavior normal.         Assessment & Plan:   Problem List Items Addressed This Visit       Cardiovascular and Mediastinum   Benign essential hypertension - Primary     Uncontrolled today and with home readings A recent OV with cardiology BP was at goal and there was discussion of possibly being able to decrease meds if he lost weight and improved diet/lifestyle efforts. Initial BP 160-170, home readings 140-150's  BP Readings from Last 3 Encounters:  08/01/22 (!) 148/72  05/29/22 (!) 155/72  03/20/22 130/62  He is maxed out on his current meds, BB, norvasc, spironolactone (may increase?), chlorthalidone, ACEI Could add hydralazine Strongly encouraged pt to start walking a few times a week and monitor home BP Goal to be close to 130/80 or under        Relevant Orders   COMPLETE METABOLIC PANEL WITH GFR   US ARTERIAL ABI (SCREENING LOWER EXTREMITY)     Respiratory   COPD (chronic obstructive pulmonary disease) ([place])    Not on inhalers, refuses CT f/up study, denies sx        Musculoskeletal and Integument   Chronic gout of foot    Lab Results  Component Value Date   LABURIC 8.0 11/05/2021  No gout flare since last christmas, on allopurinol 100 mg once daily Recheck labs today, avoid triggering foods to prevent flares      Relevant Orders   COMPLETE METABOLIC PANEL WITH GFR   Uric acid     Genitourinary   Stage 3 chronic kidney disease ([place])    monitoring        Other   Hyperlipidemia    Last lipids were  well controlled on pravastatin and supplements, continue med and continue more diet/lifestyle efforts for overall heart health      Relevant Orders   COMPLETE METABOLIC PANEL WITH GFR   US ARTERIAL ABI (SCREENING LOWER EXTREMITY)   Hx of tobacco use, presenting hazards to health    He refuses low dose CT chest screening for lung CA      Prediabetes    Recheck        Relevant Orders   COMPLETE METABOLIC PANEL WITH GFR   Hemoglobin A1c   US ARTERIAL ABI (SCREENING LOWER EXTREMITY)   Class 2 severe obesity with serious comorbidity and body mass index (BMI) of 37.0 to 37.9 in adult [facility])    Strongly encouraged to work  on diet and increase physical activity - they eat healthy but pt may still need to restrict portions/calories to loose weight, if insurance nutritinoal/cietician consult highly recommend      Relevant Orders   COMPLETE METABOLIC PANEL WITH GFR   Hemoglobin A1c   Uric acid   Anemia    Recheck cbc, was slowly trending down over the past 1-2 years      Relevant Orders   CBC with Differential/Platelet   Other Visit Diagnoses     Encounter for medication monitoring       Relevant Orders   COMPLETE METABOLIC PANEL WITH GFR   Hemoglobin A1c   Uric acid   Need for influenza vaccination       refuses   Pain in both lower extremities       anterior thigh, achy fatigued feeling with exertion, relieves with rest, screen ABI first for PAD/claudication, then consider low back/neurogenic claudication   Relevant Orders   US ARTERIAL ABI (SCREENING LOWER EXTREMITY)   Claudication of both lower extremities (HCC)       see above   Relevant Orders   COMPLETE METABOLIC PANEL WITH GFR   US ARTERIAL ABI (SCREENING LOWER EXTREMITY)        Return in about 3 months (around 11/01/2022) for Follow up.   Delsa Grana, PA-C 08/01/22 11:35 AM

## 2022-08-01 NOTE — Assessment & Plan Note (Signed)
Lab Results  Component Value Date   LABURIC 8.0 11/05/2021  No gout flare since last christmas, on allopurinol 100 mg once daily Recheck labs today, avoid triggering foods to prevent flares

## 2022-08-01 NOTE — Assessment & Plan Note (Signed)
Recheck cbc, was slowly trending down over the past 1-2 years

## 2022-08-01 NOTE — Assessment & Plan Note (Signed)
Strongly encouraged to work on diet and increase physical activity - they eat healthy but pt may still need to restrict portions/calories to loose weight, if insurance nutritinoal/cietician consult highly recommend

## 2022-08-02 LAB — CBC WITH DIFFERENTIAL/PLATELET
Absolute Monocytes: 912 cells/uL (ref 200–950)
Basophils Absolute: 48 cells/uL (ref 0–200)
Basophils Relative: 0.5 %
Eosinophils Absolute: 182 cells/uL (ref 15–500)
Eosinophils Relative: 1.9 %
HCT: 34.7 % — ABNORMAL LOW (ref 38.5–50.0)
Hemoglobin: 11.4 g/dL — ABNORMAL LOW (ref 13.2–17.1)
Lymphs Abs: 1498 cells/uL (ref 850–3900)
MCH: 28.1 pg (ref 27.0–33.0)
MCHC: 32.9 g/dL (ref 32.0–36.0)
MCV: 85.7 fL (ref 80.0–100.0)
MPV: 12.4 fL (ref 7.5–12.5)
Monocytes Relative: 9.5 %
Neutro Abs: 6960 cells/uL (ref 1500–7800)
Neutrophils Relative %: 72.5 %
Platelets: 220 10*3/uL (ref 140–400)
RBC: 4.05 10*6/uL — ABNORMAL LOW (ref 4.20–5.80)
RDW: 13.9 % (ref 11.0–15.0)
Total Lymphocyte: 15.6 %
WBC: 9.6 10*3/uL (ref 3.8–10.8)

## 2022-08-02 LAB — COMPLETE METABOLIC PANEL WITH GFR
AG Ratio: 1.2 (calc) (ref 1.0–2.5)
ALT: 5 U/L — ABNORMAL LOW (ref 9–46)
AST: 8 U/L — ABNORMAL LOW (ref 10–35)
Albumin: 4.1 g/dL (ref 3.6–5.1)
Alkaline phosphatase (APISO): 30 U/L — ABNORMAL LOW (ref 35–144)
BUN/Creatinine Ratio: 24 (calc) — ABNORMAL HIGH (ref 6–22)
BUN: 31 mg/dL — ABNORMAL HIGH (ref 7–25)
CO2: 24 mmol/L (ref 20–32)
Calcium: 10.2 mg/dL (ref 8.6–10.3)
Chloride: 106 mmol/L (ref 98–110)
Creat: 1.29 mg/dL — ABNORMAL HIGH (ref 0.70–1.28)
Globulin: 3.3 g/dL (calc) (ref 1.9–3.7)
Glucose, Bld: 83 mg/dL (ref 65–99)
Potassium: 4.3 mmol/L (ref 3.5–5.3)
Sodium: 140 mmol/L (ref 135–146)
Total Bilirubin: 0.5 mg/dL (ref 0.2–1.2)
Total Protein: 7.4 g/dL (ref 6.1–8.1)
eGFR: 60 mL/min/{1.73_m2} (ref >=60)

## 2022-08-02 LAB — HEMOGLOBIN A1C
Hgb A1c MFr Bld: 5.5 % of total Hgb (ref <5.7)
Mean Plasma Glucose: 111 mg/dL
eAG (mmol/L): 6.2 mmol/L

## 2022-08-02 LAB — URIC ACID: Uric Acid, Serum: 7 mg/dL (ref 4.0–8.0)

## 2022-08-04 ENCOUNTER — Ambulatory Visit
Admission: RE | Admit: 2022-08-04 | Discharge: 2022-08-04 | Disposition: A | Discharge status: Home / Self Care | Payer: Medicare HMO | Source: Ambulatory Visit | Attending: Family Medicine | Admitting: Family Medicine

## 2022-08-04 ENCOUNTER — Other Ambulatory Visit: Payer: Self-pay | Admitting: Family Medicine

## 2022-08-04 DIAGNOSIS — M79605 Pain in left leg: Secondary | ICD-10-CM | POA: Insufficient documentation

## 2022-08-04 DIAGNOSIS — M79604 Pain in right leg: Secondary | ICD-10-CM | POA: Diagnosis not present

## 2022-08-04 DIAGNOSIS — R7303 Prediabetes: Secondary | ICD-10-CM | POA: Diagnosis not present

## 2022-08-04 DIAGNOSIS — R6889 Other general symptoms and signs: Secondary | ICD-10-CM | POA: Insufficient documentation

## 2022-08-04 DIAGNOSIS — I70213 Atherosclerosis of native arteries of extremities with intermittent claudication, bilateral legs: Secondary | ICD-10-CM | POA: Diagnosis not present

## 2022-08-04 DIAGNOSIS — I10 Essential (primary) hypertension: Secondary | ICD-10-CM | POA: Diagnosis not present

## 2022-08-04 DIAGNOSIS — E785 Hyperlipidemia, unspecified: Secondary | ICD-10-CM | POA: Insufficient documentation

## 2022-08-04 DIAGNOSIS — M1A0790 Idiopathic chronic gout, unspecified ankle and foot, without tophus (tophi): Secondary | ICD-10-CM

## 2022-08-04 DIAGNOSIS — I739 Peripheral vascular disease, unspecified: Secondary | ICD-10-CM | POA: Diagnosis not present

## 2022-08-04 MED ORDER — ALLOPURINOL 100 MG PO TABS: 100.0000 mg | ORAL_TABLET | Freq: Every day | ORAL | 1 refills | Status: DC

## 2022-08-04 MED ORDER — PRAVASTATIN SODIUM 40 MG PO TABS: 40.0000 mg | ORAL_TABLET | Freq: Every day | ORAL | 1 refills | Status: DC

## 2022-08-04 MED ORDER — INDOMETHACIN 25 MG PO CAPS: ORAL_CAPSULE | ORAL | 1 refills | Status: DC

## 2022-08-04 MED ORDER — AMLODIPINE BESY-BENAZEPRIL HCL 10-40 MG PO CAPS: 1.0000 | ORAL_CAPSULE | Freq: Every day | ORAL | 1 refills | Status: DC

## 2022-08-04 MED ORDER — CHLORTHALIDONE 50 MG PO TABS: 50.0000 mg | ORAL_TABLET | Freq: Every day | ORAL | 1 refills | Status: DC

## 2022-08-04 NOTE — Addendum Note (Signed)
Addended by: Delsa Grana on: 08/04/2022 05:28 PM   Modules accepted: Orders

## 2022-08-04 NOTE — Assessment & Plan Note (Signed)
08/04/2022 ABI; FINDINGS: Right ABI:  0.72  Left ABI:  0.79  Right Lower Extremity:  Diminished dorsalis pedis arterial waveform.  Left Lower Extremity: Mildly blunted dorsalis pedis arterial waveform.  0.5-0.79 Moderate PAD  IMPRESSION: Abnormal bilateral resting ankle-brachial indices consistent with at least moderate underlying peripheral arterial disease.   Findings are slightly worse on the right than the left.

## 2022-08-20 DIAGNOSIS — G4733 Obstructive sleep apnea (adult) (pediatric): Secondary | ICD-10-CM | POA: Diagnosis not present

## 2022-08-26 ENCOUNTER — Encounter (INDEPENDENT_AMBULATORY_CARE_PROVIDER_SITE_OTHER): Payer: Self-pay | Admitting: Nurse Practitioner

## 2022-08-26 ENCOUNTER — Ambulatory Visit (INDEPENDENT_AMBULATORY_CARE_PROVIDER_SITE_OTHER): Payer: Medicare HMO | Admitting: Nurse Practitioner

## 2022-08-26 VITALS — BP 158/80 | HR 85 | Resp 16 | Wt 237.4 lb

## 2022-08-26 DIAGNOSIS — I70213 Atherosclerosis of native arteries of extremities with intermittent claudication, bilateral legs: Secondary | ICD-10-CM

## 2022-08-26 DIAGNOSIS — I10 Essential (primary) hypertension: Secondary | ICD-10-CM

## 2022-08-26 DIAGNOSIS — E785 Hyperlipidemia, unspecified: Secondary | ICD-10-CM | POA: Diagnosis not present

## 2022-09-10 ENCOUNTER — Encounter (INDEPENDENT_AMBULATORY_CARE_PROVIDER_SITE_OTHER): Payer: Self-pay | Admitting: Nurse Practitioner

## 2022-09-10 NOTE — Progress Notes (Signed)
Subjective:    Patient ID: Todd Hutchinson, male    DOB: 31-Dec-1950, 71 y.o.   MRN: [id number] Chief Complaint  Patient presents with   New Patient (Initial Visit)    Ref Lucio Edward consult claudication, htn,bil le pain     The patient is seen for evaluation of painful lower extremities and diminished pulses. Patient notes the pain is always associated with activity and is very consistent day today. Typically, the pain occurs at less than one block, progress is as activity continues to the point that the patient must stop walking. Resting including standing still for several minutes allows the patient to walk a similar distance before being forced to stop again. Uneven terrain and inclines shorten the distance. The pain has been progressive over the past several years. The patient denies any abrupt changes in claudication symptoms.  The patient notes that these claudication-like symptoms are tolerable for him currently.  The patient denies rest pain or dangling of an extremity off the side of the bed during the night for relief. No open wounds or sores at this time. No prior interventions or surgeries.  No history of back problems or DJD of the lumbar sacral spine.   The patient's blood pressure has been stable and relatively well controlled. The patient denies amaurosis fugax or recent TIA symptoms. There are no recent neurological changes noted. The patient denies history of DVT, PE or superficial thrombophlebitis. The patient denies recent episodes of angina or shortness of breath.   Patient had ABIs done at an outside office which show an ABI of 0.72 on the right and 0.79 on the left    Review of Systems  Cardiovascular:        Claudication  All other systems reviewed and are negative.      Objective:   Physical Exam Vitals reviewed.  HENT:     Head: Normocephalic.  Cardiovascular:     Rate and Rhythm: Normal rate.     Pulses:          Dorsalis pedis pulses are 1+ on  the right side and 1+ on the left side.  Pulmonary:     Effort: Pulmonary effort is normal.  Skin:    General: Skin is warm and dry.  Neurological:     Mental Status: He is alert and oriented to person, place, and time.  Psychiatric:        Mood and Affect: Mood normal.        Behavior: Behavior normal.        Thought Content: Thought content normal.        Judgment: Judgment normal.     BP (!) 158/80 (BP Location: Left Arm)   Pulse 85   Resp 16   Wt 237 lb 6.4 oz (107.7 kg)   BMI 37.18 kg/m   Past Medical History:  Diagnosis Date   Abnormal weight gain 03/13/2016   COPD (chronic obstructive pulmonary disease) (HCC)    Hx of malignant neoplasm of prostate 05/31/2012   Hx of tobacco use, presenting hazards to health 04/17/2016   Hypercholesterolemia 03/13/2016   Hyperlipidemia    Hypertension    Hypertensive nephrosclerosis 01/27/2018   Prostate cancer ([place])    Sleep apnea    uses CPAP    Social History   Socioeconomic History   Marital status: Married    Spouse name: Dina Rich    Number of children: 1   Years of education: Not on file   Highest education level:  12th grade  Occupational History    Comment: Retired   Tobacco Use   Smoking status: Former    Packs/day: 1.00    Years: 40.00    Total pack years: 40.00    Types: Cigarettes    Quit date: 11/2013    Years since quitting: 8.8   Smokeless tobacco: Never  Vaping Use   Vaping Use: Never used  Substance and Sexual Activity   Alcohol use: Yes    Alcohol/week: 16.0 standard drinks of alcohol    Types: 16 Shots of liquor per week    Comment: no more than 4 whiskey drinks a day; maybe 4 times day    Drug use: No   Sexual activity: Never    Partners: Female  Other Topics Concern   Not on file  Social History Narrative   Not on file   Social Determinants of Health   Financial Resource Strain: Low Risk  (07/02/2018)   Overall Financial Resource Strain (CARDIA)    Difficulty of Paying Living Expenses: Not  hard at all  Food Insecurity: No Food Insecurity (07/02/2018)   Hunger Vital Sign    Worried About Running Out of Food in the Last Year: Never true    [place] in the Last Year: Never true  Transportation Needs: No Transportation Needs (04/02/2020)   PRAPARE - Hydrologist (Medical): No    Lack of Transportation (Non-Medical): No  Physical Activity: Inactive (07/05/2019)   Exercise Vital Sign    Days of Exercise per Week: 0 days    Minutes of Exercise per Session: 0 min  Stress: No Stress Concern Present (07/02/2018)   [place]    Feeling of Stress : Not at all  Social Connections: Moderately Integrated (07/02/2018)   Social Connection and Isolation Panel [NHANES]    Frequency of Communication with Friends and Family: More than three times a week    Frequency of Social Gatherings with Friends and Family: Once a week    Attends Religious Services: More than 4 times per year    Active Member of [employer] or Organizations: No    Attends Archivist Meetings: Never    Marital Status: Married  Human resources officer Violence: Not At Risk (07/02/2018)   Humiliation, Afraid, Rape, and Kick questionnaire    Fear of Current or Ex-Partner: No    Emotionally Abused: No    Physically Abused: No    Sexually Abused: No    Past Surgical History:  Procedure Laterality Date   COLONOSCOPY WITH PROPOFOL N/A 07/29/2018   Procedure: COLONOSCOPY WITH BIOPSIES;  Surgeon: Lucilla Lame, MD;  Location: [place];  Service: Endoscopy;  Laterality: N/A;   POLYPECTOMY N/A 07/29/2018   Procedure: POLYPECTOMY;  Surgeon: Lucilla Lame, MD;  Location: [place];  Service: Endoscopy;  Laterality: N/A;   PROSTATE SURGERY      Family History  Problem Relation Age of Onset   Hypertension Mother    Aneurysm Mother 37       brain   Hypertension Father    Heart attack Father    Hypertension  Sister    Hypertension Brother    Heart failure Brother    Heart disease Paternal Grandfather    Hypertension Sister    Hypertension Brother    Cancer Brother        throat cancer   Kidney cancer Neg Hx    Kidney failure Neg  Hx    Prostate cancer Neg Hx    Sickle cell anemia Neg Hx    Tuberculosis Neg Hx     Allergies  Allergen Reactions   Atorvastatin Rash       Latest Ref Rng & Units 08/01/2022   12:10 PM 11/05/2021    9:39 AM 04/26/2021   11:49 AM  CBC  WBC 3.8 - 10.8 Thousand/uL 9.6  8.9  6.6   Hemoglobin 13.2 - 17.1 g/dL 11.4  11.1  11.9   Hematocrit 38.5 - 50.0 % 34.7  35.6  37.4   Platelets 140 - 400 Thousand/uL 220  241  199       CMP     Component Value Date/Time   NA 140 08/01/2022 1210   NA 141 11/14/2021 0958   K 4.3 08/01/2022 1210   CL 106 08/01/2022 1210   CO2 24 08/01/2022 1210   GLUCOSE 83 08/01/2022 1210   BUN 31 (H) 08/01/2022 1210   BUN 21 11/14/2021 0958   CREATININE 1.29 (H) 08/01/2022 1210   CALCIUM 10.2 08/01/2022 1210   PROT 7.4 08/01/2022 1210   PROT 7.1 06/18/2015 0916   ALBUMIN 4.4 04/15/2017 0921   ALBUMIN 4.7 06/18/2015 0916   AST 8 (L) 08/01/2022 1210   ALT 5 (L) 08/01/2022 1210   ALKPHOS 36 (L) 04/15/2017 0921   BILITOT 0.5 08/01/2022 1210   BILITOT 0.3 06/18/2015 0916   GFRNONAA 62 10/11/2020 1236   GFRAA 72 10/11/2020 1236     No results found.     Assessment & Plan:   1. Atherosclerosis of native artery of both lower extremities with intermittent claudication ([place]) I had a long discussion with the patient regarding peripheral arterial disease as well as options for treatment versus conservative therapy.  Currently at this time the patient only has claudication symptoms with no worsening such as rest pain or limb threatening symptoms.  Based on this the patient is advised to continue with exercise and walking for 15 to 20 minutes 3 to 4 days/week.  Will see the patient in 3 months with noninvasive studies or sooner if  claudication symptoms worsen.  2. Benign essential hypertension Continue antihypertensive medications as already ordered, these medications have been reviewed and there are no changes at this time.  3. Hyperlipidemia, unspecified hyperlipidemia type Continue statin as ordered and reviewed, no changes at this time   Current Outpatient Medications on File Prior to Visit  Medication Sig Dispense Refill   allopurinol (ZYLOPRIM) 100 MG tablet Take 1 tablet (100 mg total) by mouth daily. 90 tablet 1   amLODipine-benazepril (LOTREL) 10-40 MG capsule Take 1 capsule by mouth daily. 90 capsule 1   bisoprolol (ZEBETA) 10 MG tablet Take 1 tablet (10 mg total) by mouth daily. 90 tablet 3   chlorthalidone (HYGROTON) 50 MG tablet Take 1 tablet (50 mg total) by mouth daily. 90 tablet 1   Cholecalciferol (VITAMIN D-3) 1000 units CAPS Take 1 capsule (1,000 Units total) by mouth daily.     indomethacin (INDOCIN) 25 MG capsule At first sign of pain/gout flare take 50 mg PO TID for 2 to 3 days and then decrease to 25 mg po TID x 2 d 30 capsule 1   Omega-3 Fatty Acids (FISH OIL) 1000 MG CAPS Take 1,000 mg by mouth daily.     pravastatin (PRAVACHOL) 40 MG tablet Take 1 tablet (40 mg total) by mouth at bedtime. 90 tablet 1   spironolactone (ALDACTONE) 50 MG tablet Take 1  tablet (50 mg total) by mouth daily. 30 tablet 10   No current facility-administered medications on file prior to visit.    There are no Patient Instructions on file for this visit. No follow-ups on file.   Kris Hartmann, NP

## 2022-10-04 ENCOUNTER — Telehealth: Payer: Self-pay | Admitting: Physician Assistant

## 2022-10-04 DIAGNOSIS — I10 Essential (primary) hypertension: Secondary | ICD-10-CM

## 2022-10-06 NOTE — Telephone Encounter (Signed)
Requested Prescriptions  Refused Prescriptions Disp Refills   chlorthalidone (HYGROTON) 50 MG tablet [Pharmacy Med Name: CHLORTHALID  TAB '50MG'$ ] 90 tablet 0    Sig: TAKE 1 TABLET DAILY (DOSE   INCREASE)     Cardiovascular: Diuretics - Thiazide Failed - 10/04/2022  1:57 PM      Failed - Cr in normal range and within 180 days    Creat  Date Value Ref Range Status  08/01/2022 1.29 (H) 0.70 - 1.28 mg/dL Final   Creatinine, Urine  Date Value Ref Range Status  07/29/2017 88 20 - 320 mg/dL Final         Failed - Last BP in normal range    BP Readings from Last 1 Encounters:  08/26/22 (!) 158/80         Passed - K in normal range and within 180 days    Potassium  Date Value Ref Range Status  08/01/2022 4.3 3.5 - 5.3 mmol/L Final         Passed - Na in normal range and within 180 days    Sodium  Date Value Ref Range Status  08/01/2022 140 135 - 146 mmol/L Final  11/14/2021 141 134 - 144 mmol/L Final         Passed - Valid encounter within last 6 months    Recent Outpatient Visits           2 months ago Benign essential hypertension   [facility] Delsa Grana, PA-C   8 months ago Benign essential hypertension   [facility] Mecum, Dani Gobble, PA-C   11 months ago Anemia, unspecified type   [place], Dani Gobble, PA-C   1 year ago Chronic gout of foot, unspecified cause, unspecified laterality   [facility] Delsa Grana, PA-C   1 year ago Essential hypertension   [facility] Delsa Grana, PA-C       Future Appointments             In 1 week Kate Sable, MD [place] at [place]   In 4 weeks Delsa Grana, [facility], [place]   In 8 months Diamantina Providence, Herbert Seta, MD [facility] [place]             amLODipine-benazepril (LOTREL) 10-40 MG capsule [Pharmacy Med Name: AMLOD/BENAZP CAP  10-'40MG'$ ] 90 capsule 0    Sig: TAKE 1 CAPSULE DAILY     Cardiovascular: CCB + ACEI Combos Failed - 10/04/2022  1:57 PM      Failed - Cr in normal range and within 180 days    Creat  Date Value Ref Range Status  08/01/2022 1.29 (H) 0.70 - 1.28 mg/dL Final   Creatinine, Urine  Date Value Ref Range Status  07/29/2017 88 20 - 320 mg/dL Final         Failed - Last BP in normal range    BP Readings from Last 1 Encounters:  08/26/22 (!) 158/80         Passed - K in normal range and within 180 days    Potassium  Date Value Ref Range Status  08/01/2022 4.3 3.5 - 5.3 mmol/L Final         Passed - Na in normal range and within 180 days    Sodium  Date Value Ref Range Status  08/01/2022 140 135 - 146 mmol/L Final  11/14/2021 141 134 -  144 mmol/L Final         Passed - eGFR is 30 or above and within 180 days    GFR, Est African American  Date Value Ref Range Status  10/11/2020 72 > OR = 60 mL/min/1.59m Final   GFR, Est Non African American  Date Value Ref Range Status  10/11/2020 62 > OR = 60 mL/min/1.757mFinal   eGFR  Date Value Ref Range Status  08/01/2022 60 > OR = 60 mL/min/1.7331minal  11/14/2021 74 >59 mL/min/1.73 Final         Passed - Patient is not pregnant      Passed - Valid encounter within last 6 months    Recent Outpatient Visits           2 months ago [facility]   8 months ago Benign essential hypertension   [place]   11 months ago Anemia, unspecified type   [place]   1 year ago Chronic gout of foot, unspecified cause, unspecified laterality   [facility]   1 year ago Essential hypertension   [facility]    Future Appointments             In 1 week AgbKate SableD  [place] Bur[place] RidgeIn 4 weeks TapDelsa GranaA-[facility]In 8 months SniDiamantina ProvidenceriHerbert SetaD Con[place]

## 2022-10-13 ENCOUNTER — Other Ambulatory Visit: Payer: Self-pay | Admitting: Physician Assistant

## 2022-10-13 DIAGNOSIS — I10 Essential (primary) hypertension: Secondary | ICD-10-CM

## 2022-10-14 ENCOUNTER — Other Ambulatory Visit: Payer: Self-pay

## 2022-10-14 DIAGNOSIS — I10 Essential (primary) hypertension: Secondary | ICD-10-CM

## 2022-10-14 DIAGNOSIS — E785 Hyperlipidemia, unspecified: Secondary | ICD-10-CM

## 2022-10-14 MED ORDER — PRAVASTATIN SODIUM 40 MG PO TABS: 40.0000 mg | ORAL_TABLET | Freq: Every day | ORAL | 0 refills | Status: DC

## 2022-10-14 MED ORDER — AMLODIPINE BESY-BENAZEPRIL HCL 10-40 MG PO CAPS: 1.0000 | ORAL_CAPSULE | Freq: Every day | ORAL | 0 refills | Status: DC

## 2022-10-14 NOTE — Telephone Encounter (Addendum)
Patient called and advised that [facility]. He says it should go to [facility] for insurance to pay. He says he [facility] and had to pay. I advised I will send this to [facility] as requested for the next time it's due.    Requested Prescriptions  Pending Prescriptions Disp Refills   chlorthalidone (HYGROTON) 50 MG tablet [Pharmacy Med Name: CHLORTHALID  TAB '50MG'$ ] 90 tablet 0    Sig: TAKE 1 TABLET DAILY (DOSE   INCREASE)     Cardiovascular: Diuretics - Thiazide Failed - 10/13/2022  6:22 PM      Failed - Cr in normal range and within 180 days    Creat  Date Value Ref Range Status  08/01/2022 1.29 (H) 0.70 - 1.28 mg/dL Final   Creatinine, Urine  Date Value Ref Range Status  07/29/2017 88 20 - 320 mg/dL Final         Failed - Last BP in normal range    BP Readings from Last 1 Encounters:  08/26/22 (!) 158/80         Passed - K in normal range and within 180 days    Potassium  Date Value Ref Range Status  08/01/2022 4.3 3.5 - 5.3 mmol/L Final         Passed - Na in normal range and within 180 days    Sodium  Date Value Ref Range Status  08/01/2022 140 135 - 146 mmol/L Final  11/14/2021 141 134 - 144 mmol/L Final         Passed - Valid encounter within last 6 months    Recent Outpatient Visits           2 months ago Benign essential hypertension   [facility] Delsa Grana, PA-C   8 months ago Benign essential hypertension   [facility] Mecum, Dani Gobble, PA-C   11 months ago Anemia, unspecified type   [place], PA-C   1 year ago Chronic gout of foot, unspecified cause, unspecified laterality   [facility] Delsa Grana, PA-C   1 year ago Essential hypertension   [facility] Delsa Grana, PA-C       Future Appointments             In 3 days  Agbor-Etang, Aaron Edelman, MD [place] at [place]   In 3 weeks Delsa Grana, [facility], [place]   In 7 months Diamantina Providence, Herbert Seta, MD [place]

## 2022-10-14 NOTE — Telephone Encounter (Signed)
Patient called concerning refill of chlorthalidone that was requested from [place] in a refill encounter on 10/13/22. He says that chlorthalidone, amlodipine-benazepril and pravastatin are supposed to be sent to [facility] so the insurance will pay, otherwise he will have to pay out of pocket. Advised [facility] and that I will go ahead and submit to [facility] for the next refills. He says [facility], just these [facility] to [facility], so the preferred pharmacy profile was not changed.

## 2022-10-14 NOTE — Telephone Encounter (Signed)
Requested Prescriptions  Pending Prescriptions Disp Refills   amLODipine-benazepril (LOTREL) 10-40 MG capsule 90 capsule 0    Sig: Take 1 capsule by mouth daily.     Cardiovascular: CCB + ACEI Combos Failed - 10/14/2022  2:16 PM      Failed - Cr in normal range and within 180 days    Creat  Date Value Ref Range Status  08/01/2022 1.29 (H) 0.70 - 1.28 mg/dL Final   Creatinine, Urine  Date Value Ref Range Status  07/29/2017 88 20 - 320 mg/dL Final         Failed - Last BP in normal range    BP Readings from Last 1 Encounters:  08/26/22 (!) 158/80         Passed - K in normal range and within 180 days    Potassium  Date Value Ref Range Status  08/01/2022 4.3 3.5 - 5.3 mmol/L Final         Passed - Na in normal range and within 180 days    Sodium  Date Value Ref Range Status  08/01/2022 140 135 - 146 mmol/L Final  11/14/2021 141 134 - 144 mmol/L Final         Passed - eGFR is 30 or above and within 180 days    GFR, Est African American  Date Value Ref Range Status  10/11/2020 72 > OR = 60 mL/min/1.97m Final   GFR, Est Non African American  Date Value Ref Range Status  10/11/2020 62 > OR = 60 mL/min/1.785mFinal   eGFR  Date Value Ref Range Status  08/01/2022 60 > OR = 60 mL/min/1.7372minal  11/14/2021 74 >59 mL/min/1.73 Final         Passed - Patient is not pregnant      Passed - Valid encounter within last 6 months    Recent Outpatient Visits           2 months ago Benign essential hypertension   [facility]   8 months ago Benign essential hypertension   [facility] EriDani GobbleA-[facility]   11 months ago Anemia, unspecified type   [facility]Dani GobbleA-C   1 year ago Chronic gout of foot, unspecified cause, unspecified laterality   [facility]   1 year ago Essential hypertension   [facility]       Future Appointments             In 3 days AgbKate SableD Con[place]ug[place] BurBuffaloIn 3 weeks TapDelsa GranaA-C Con[facility]SebastopolIn 7 months SniBilley CoD [facility] [place]             pravastatin (PRAVACHOL) 40 MG tablet 90 tablet 0    Sig: Take 1 tablet (40 mg total) by mouth at bedtime.     Cardiovascular:  Antilipid - Statins Failed - 10/14/2022  2:16 PM      Failed - Lipid Panel in normal range within the last 12 months    Cholesterol, Total  Date Value Ref Range Status  04/18/2015 139 100 - 199 mg/dL Final   Cholesterol  Date Value Ref Range Status  11/05/2021 149 <200 mg/dL Final   LDL Cholesterol (Calc)  Date Value Ref Range Status  11/05/2021 71 mg/dL (  calc) Final    Comment:    Reference range: <100 . Desirable range <100 mg/dL for primary prevention;   <70 mg/dL for patients with CHD or diabetic patients  with > or = 2 CHD risk factors. Marland Kitchen LDL-C is now calculated using the Martin-Hopkins  calculation, which is a validated novel method providing  better accuracy than the Friedewald equation in the  estimation of LDL-C.  Cresenciano Genre et al. Annamaria Helling. 4585;929(24): 2061-2068  (http://education.Quest[employer].com/faq/FAQ164)    HDL  Date Value Ref Range Status  11/05/2021 62 > OR = 40 mg/dL Final  04/18/2015 46 >39 mg/dL Final    Comment:    According to ATP-III Guidelines, HDL-C >59 mg/dL is considered a negative risk factor for CHD.    Triglycerides  Date Value Ref Range Status  11/05/2021 78 <150 mg/dL Final         Passed - Patient is not pregnant      Passed - Valid encounter within last 12 months    Recent Outpatient Visits           2 months ago Benign essential hypertension   [facility] Delsa Grana, PA-C   8 months ago Benign essential hypertension   [place], PA-C   11 months ago Anemia, unspecified type   [place], PA-C   1 year ago Chronic gout of foot, unspecified cause, unspecified laterality   [facility] Delsa Grana, PA-C   1 year ago Essential hypertension   [facility] Delsa Grana, [place]       Future Appointments             In 3 days Kate Sable, MD [place] at [place]   In 3 weeks Delsa Grana, [facility], [place]   In 7 months Diamantina Providence, Herbert Seta, MD [place]

## 2022-10-17 ENCOUNTER — Encounter: Payer: Self-pay | Admitting: Cardiology

## 2022-10-17 ENCOUNTER — Ambulatory Visit: Payer: Medicare HMO | Attending: Cardiology | Admitting: Cardiology

## 2022-10-17 VITALS — BP 146/68 | HR 60 | Ht 67.0 in | Wt 236.6 lb

## 2022-10-17 DIAGNOSIS — I739 Peripheral vascular disease, unspecified: Secondary | ICD-10-CM

## 2022-10-17 DIAGNOSIS — I10 Essential (primary) hypertension: Secondary | ICD-10-CM

## 2022-10-17 DIAGNOSIS — E785 Hyperlipidemia, unspecified: Secondary | ICD-10-CM

## 2022-10-17 MED ORDER — AMLODIPINE BESY-BENAZEPRIL HCL 10-40 MG PO CAPS: 1.0000 | ORAL_CAPSULE | Freq: Every day | ORAL | 3 refills | Status: DC

## 2022-10-17 MED ORDER — CILOSTAZOL 100 MG PO TABS: 100.0000 mg | ORAL_TABLET | Freq: Two times a day (BID) | ORAL | 3 refills | Status: DC

## 2022-10-17 NOTE — Progress Notes (Signed)
Cardiology Office Note:    Date:  10/17/2022   ID:  Todd Hutchinson, DOB 1951/01/05, MRN [id number]  PCP:  [place] [facility] Cardiologist:  Kate Sable, MD     Referring MD: [place]*   Chief Complaint  Patient presents with   Follow-up    6 month f/u, no new cardiac concerns     History of Present Illness:    Todd Hutchinson is a 72 y.o. male with a hx of hypertension, hyperlipidemia, PAD, COPD, former smoker x40+ years who presents for follow-up.    Patient seen for hypertension, blood pressures systolic ranging in the 027X to 150s.  Not able to exercise as he would like at home due to high fatigue.  Previous ABI was abnormal, follows up with vascular surgery, diagnosed with moderate PAD, being monitored chronically, with plans for repeat imaging next month.   Prior notes  Lower extremity ultrasound 07/2022 right ABI 0.7, left ABI 0.79 Renal artery ultrasound 01/2022 mild RAS, 1 to 59% previously was on carvedilol but did not tolerate due to abdominal discomfort and diarrhea.     Past Medical History:  Diagnosis Date   Abnormal weight gain 03/13/2016   COPD (chronic obstructive pulmonary disease) (HCC)    Hx of malignant neoplasm of prostate 05/31/2012   Hx of tobacco use, presenting hazards to health 04/17/2016   Hypercholesterolemia 03/13/2016   Hyperlipidemia    Hypertension    Hypertensive nephrosclerosis 01/27/2018   Prostate cancer ([place])    Sleep apnea    uses CPAP    Past Surgical History:  Procedure Laterality Date   COLONOSCOPY WITH PROPOFOL N/A 07/29/2018   Procedure: COLONOSCOPY WITH BIOPSIES;  Surgeon: Lucilla Lame, MD;  Location: [facility];  Service: Endoscopy;  Laterality: N/A;   POLYPECTOMY N/A 07/29/2018   Procedure: POLYPECTOMY;  Surgeon: Lucilla Lame, MD;  Location: [facility];  Service: Endoscopy;  Laterality: N/A;   PROSTATE SURGERY      Current  Medications: Current Meds  Medication Sig   allopurinol (ZYLOPRIM) 100 MG tablet Take 1 tablet (100 mg total) by mouth daily.   bisoprolol (ZEBETA) 10 MG tablet Take 1 tablet (10 mg total) by mouth daily.   chlorthalidone (HYGROTON) 50 MG tablet TAKE 1 TABLET DAILY (DOSE   INCREASE)   Cholecalciferol (VITAMIN D-3) 1000 units CAPS Take 1 capsule (1,000 Units total) by mouth daily.   cilostazol (PLETAL) 100 MG tablet Take 1 tablet (100 mg total) by mouth 2 (two) times daily.   indomethacin (INDOCIN) 25 MG capsule At first sign of pain/gout flare take 50 mg PO TID for 2 to 3 days and then decrease to 25 mg po TID x 2 d   Omega-3 Fatty Acids (FISH OIL) 1000 MG CAPS Take 1,000 mg by mouth daily.   pravastatin (PRAVACHOL) 40 MG tablet Take 1 tablet (40 mg total) by mouth at bedtime.   spironolactone (ALDACTONE) 50 MG tablet Take 1 tablet (50 mg total) by mouth daily.   [DISCONTINUED] amLODipine-benazepril (LOTREL) 10-40 MG capsule Take 1 capsule by mouth daily.     Allergies:   Atorvastatin   Social History   Socioeconomic History   Marital status: Married    Spouse name: Dina Rich    Number of children: 1   Years of education: Not on file   Highest education level: 12th grade  Occupational History    Comment: Retired   Tobacco Use   Smoking status: Former  Packs/day: 1.00    Years: 40.00    Total pack years: 40.00    Types: Cigarettes    Quit date: 11/2013    Years since quitting: 8.9   Smokeless tobacco: Never  Vaping Use   Vaping Use: Never used  Substance and Sexual Activity   Alcohol use: Yes    Alcohol/week: 16.0 standard drinks of alcohol    Types: 16 Shots of liquor per week    Comment: no more than 4 whiskey drinks a day; maybe 4 times day    Drug use: No   Sexual activity: Never    Partners: Female  Other Topics Concern   Not on file  Social History Narrative   Not on file   Social Determinants of Health   Financial Resource Strain: Low Risk  (07/02/2018)    Overall Financial Resource Strain (CARDIA)    Difficulty of Paying Living Expenses: Not hard at all  Food Insecurity: No Food Insecurity (07/02/2018)   Hunger Vital Sign    Worried About Running Out of Food in the Last Year: Never true    [place] in the Last Year: Never true  Transportation Needs: No Transportation Needs (04/02/2020)   PRAPARE - Hydrologist (Medical): No    Lack of Transportation (Non-Medical): No  Physical Activity: Inactive (07/05/2019)   Exercise Vital Sign    Days of Exercise per Week: 0 days    Minutes of Exercise per Session: 0 min  Stress: No Stress Concern Present (07/02/2018)   [place]    Feeling of Stress : Not at all  Social Connections: Moderately Integrated (07/02/2018)   Social Connection and Isolation Panel [NHANES]    Frequency of Communication with Friends and Family: More than three times a week    Frequency of Social Gatherings with Friends and Family: Once a week    Attends Religious Services: More than 4 times per year    Active Member of [employer] or Organizations: No    Attends Music therapist: Never    Marital Status: Married     Family History: The patient's family history includes Aneurysm (age of onset: 40) in his mother; Cancer in his brother; Heart attack in his father; Heart disease in his paternal grandfather; Heart failure in his brother; Hypertension in his brother, brother, father, mother, sister, and sister. There is no history of Kidney cancer, Kidney failure, Prostate cancer, Sickle cell anemia, or Tuberculosis.  ROS:   Please see the history of present illness.     All other systems reviewed and are negative.  EKGs/Labs/Other Studies Reviewed:    The following studies were reviewed today:   EKG:  EKG is ordered today.  EKG shows normal sinus rhythm  Recent Labs: 08/01/2022: ALT 5; BUN 31; Creat 1.29;  Hemoglobin 11.4; Platelets 220; Potassium 4.3; Sodium 140  Recent Lipid Panel    Component Value Date/Time   CHOL 149 11/05/2021 0939   CHOL 139 04/18/2015 0823   TRIG 78 11/05/2021 0939   HDL 62 11/05/2021 0939   HDL 46 04/18/2015 0823   CHOLHDL 2.4 11/05/2021 0939   VLDL 46 (H) 04/15/2017 0921   LDLCALC 71 11/05/2021 0939     Risk Assessment/Calculations:          Physical Exam:    VS:  BP (!) 146/68 (BP Location: Left Arm, Patient Position: Sitting, Cuff Size: Large)   Pulse 60  Ht '5\' 7"'$  (1.702 m)   Wt 236 lb 9.6 oz (107.3 kg)   SpO2 94%   BMI 37.06 kg/m     Wt Readings from Last 3 Encounters:  10/17/22 236 lb 9.6 oz (107.3 kg)  08/26/22 237 lb 6.4 oz (107.7 kg)  08/01/22 238 lb 4.8 oz (108.1 kg)     GEN:  Well nourished, well developed in no acute distress HEENT: Normal NECK: No JVD; No carotid bruits CARDIAC: RRR, no murmurs, rubs, gallops RESPIRATORY:  Clear to auscultation without rales, wheezing or rhonchi  ABDOMEN: Soft, non-tender, non-distended MUSCULOSKELETAL:  No edema; No deformity  SKIN: Warm and dry NEUROLOGIC:  Alert and oriented x 3 PSYCHIATRIC:  Normal affect   ASSESSMENT:    1. Primary hypertension   2. Hyperlipidemia, unspecified hyperlipidemia type   3. PAD (peripheral artery disease) (HCC)    PLAN:    In order of problems listed above:  Hypertension, BP elevated..  Cont Aldactone 50 mg daily, bisoprolol to 10 mg daily, chlorthalidone 25, Lotrel 10-'40mg'$  qd as prescribed.  Renal artery ultrasound showed mild nonobstructive stenosis.  Titrate/Add hydralazine if BP stays elevated at follow-up visit. Hyperlipidemia, cholesterol controlled, continue Pravachol Claudication, abnormal ABI.  Start cilostazol to help with walking distance.  Continue Pravachol.  Keep follow-up appointment with vascular surgery.  Follow-up in 6 weeks.  Medication Adjustments/Labs and Tests Ordered: Current medicines are reviewed at length with the patient  today.  Concerns regarding medicines are outlined above.  Orders Placed This Encounter  Procedures   EKG 12-Lead    Meds ordered this encounter  Medications   amLODipine-benazepril (LOTREL) 10-40 MG capsule    Sig: Take 1 capsule by mouth daily.    Dispense:  90 capsule    Refill:  3   cilostazol (PLETAL) 100 MG tablet    Sig: Take 1 tablet (100 mg total) by mouth 2 (two) times daily.    Dispense:  180 tablet    Refill:  3     Patient Instructions  Medication Instructions:   START Cilostazol - take one tablet (100 mg) by mouth twice a day.   *If you need a refill on your cardiac medications before your next appointment, please call your pharmacy*   Lab Work:  None Ordered  If you have labs (blood work) drawn today and your tests are completely normal, you will receive your results only by: [place] (if you have MyChart) OR A paper copy in the mail If you have any lab test that is abnormal or we need to change your treatment, we will call you to review the results.   Testing/Procedures:  None Ordered   Follow-Up: At [facility], you and your health needs are our priority.  As part of our continuing mission to provide you with exceptional heart care, we have created designated Provider Care Teams.  These Care Teams include your primary Cardiologist (physician) and Advanced Practice Providers (APPs -  Physician Assistants and Nurse Practitioners) who all work together to provide you with the care you need, when you need it.  We recommend signing up for the patient portal called "MyChart".  Sign up information is provided on this After Visit Summary.  MyChart is used to connect with patients for Virtual Visits (Telemedicine).  Patients are able to view lab/test results, encounter notes, upcoming appointments, etc.  Non-urgent messages can be sent to your provider as well.   To learn more about what you can do with MyChart, go to  [email or web address].     Your next appointment:   6 week(s)  Provider:   You may see Kate Sable, MD ONLY   Signed, Kate Sable, MD  10/17/2022 4:09 PM    [facility]

## 2022-10-17 NOTE — Patient Instructions (Addendum)
Medication Instructions:   START Cilostazol - take one tablet (100 mg) by mouth twice a day.   *If you need a refill on your cardiac medications before your next appointment, please call your pharmacy*   Lab Work:  None Ordered  If you have labs (blood work) drawn today and your tests are completely normal, you will receive your results only by: [place] (if you have MyChart) OR A paper copy in the mail If you have any lab test that is abnormal or we need to change your treatment, we will call you to review the results.   Testing/Procedures:  None Ordered   Follow-Up: At [facility], you and your health needs are our priority.  As part of our continuing mission to provide you with exceptional heart care, we have created designated Provider Care Teams.  These Care Teams include your primary Cardiologist (physician) and Advanced Practice Providers (APPs -  Physician Assistants and Nurse Practitioners) who all work together to provide you with the care you need, when you need it.  We recommend signing up for the patient portal called "MyChart".  Sign up information is provided on this After Visit Summary.  MyChart is used to connect with patients for Virtual Visits (Telemedicine).  Patients are able to view lab/test results, encounter notes, upcoming appointments, etc.  Non-urgent messages can be sent to your provider as well.   To learn more about what you can do with MyChart, go to [email or web address].    Your next appointment:   6 week(s)  Provider:   You may see Kate Sable, MD ONLY

## 2022-10-30 ENCOUNTER — Other Ambulatory Visit: Payer: Self-pay

## 2022-10-30 MED ORDER — SPIRONOLACTONE 50 MG PO TABS: 50.0000 mg | ORAL_TABLET | Freq: Every day | ORAL | 10 refills | Status: DC

## 2022-11-04 ENCOUNTER — Encounter: Payer: Self-pay | Admitting: Family Medicine

## 2022-11-04 ENCOUNTER — Ambulatory Visit (INDEPENDENT_AMBULATORY_CARE_PROVIDER_SITE_OTHER): Payer: Medicare HMO | Admitting: Family Medicine

## 2022-11-04 VITALS — BP 138/68 | HR 94 | Temp 97.9°F | Resp 16 | Ht 67.0 in | Wt 234.3 lb

## 2022-11-04 DIAGNOSIS — Z5181 Encounter for therapeutic drug level monitoring: Secondary | ICD-10-CM

## 2022-11-04 DIAGNOSIS — Z23 Encounter for immunization: Secondary | ICD-10-CM

## 2022-11-04 DIAGNOSIS — C61 Malignant neoplasm of prostate: Secondary | ICD-10-CM

## 2022-11-04 DIAGNOSIS — J449 Chronic obstructive pulmonary disease, unspecified: Secondary | ICD-10-CM

## 2022-11-04 DIAGNOSIS — I70213 Atherosclerosis of native arteries of extremities with intermittent claudication, bilateral legs: Secondary | ICD-10-CM

## 2022-11-04 DIAGNOSIS — Z87891 Personal history of nicotine dependence: Secondary | ICD-10-CM

## 2022-11-04 DIAGNOSIS — M1A0790 Idiopathic chronic gout, unspecified ankle and foot, without tophus (tophi): Secondary | ICD-10-CM

## 2022-11-04 DIAGNOSIS — Z6837 Body mass index (BMI) 37.0-37.9, adult: Secondary | ICD-10-CM

## 2022-11-04 DIAGNOSIS — D649 Anemia, unspecified: Secondary | ICD-10-CM

## 2022-11-04 DIAGNOSIS — E785 Hyperlipidemia, unspecified: Secondary | ICD-10-CM

## 2022-11-04 DIAGNOSIS — N183 Chronic kidney disease, stage 3 unspecified: Secondary | ICD-10-CM

## 2022-11-04 DIAGNOSIS — I10 Essential (primary) hypertension: Secondary | ICD-10-CM

## 2022-11-04 DIAGNOSIS — R7303 Prediabetes: Secondary | ICD-10-CM

## 2022-11-04 MED ORDER — ZOSTER VAC RECOMB ADJUVANTED 50 MCG/0.5ML IM SUSR: 0.5000 mL | Freq: Once | INTRAMUSCULAR | 1 refills | Status: AC

## 2022-11-04 NOTE — Assessment & Plan Note (Signed)
A1c was rechecked about 3 months ago and was 5.5 in normal range continue diet lifestyle efforts

## 2022-11-04 NOTE — Assessment & Plan Note (Addendum)
Years of anemia which appears to have fairly stable hemoglobin We previously did iron panel which was normal He has had colonoscopies done previously Unclear if its anemia secondary to chronic disease? I will research the chart to see if there is anything else that needs to be done to work this up He currently denies any GI upset, history of GI bleed, blood in stool, hematuria, easy bleeding or bruising Hemoglobin  Date Value Ref Range Status  08/01/2022 11.4 (L) 13.2 - 17.1 g/dL Final  11/05/2021 11.1 (L) 13.2 - 17.1 g/dL Final  04/26/2021 11.9 (L) 13.2 - 17.1 g/dL Final  10/11/2020 12.1 (L) 13.2 - 17.1 g/dL Final  04/18/2015 10.8 (L) 12.6 - 17.7 g/dL Final   HGB  Date Value Ref Range Status  11/03/2013 12.2 (L) 13.0 - 18.0 g/dL Final   Lab Results  Component Value Date   IRON 61 11/12/2021   TIBC 303 11/12/2021   FERRITIN 34 11/12/2021   Lost colonoscopy 2019 due later this year

## 2022-11-04 NOTE — Patient Instructions (Signed)
Health Maintenance  Topic Date Due   DTaP/Tdap/Td vaccine (1 - Tdap) Never done   Medicare Annual Wellness Visit  07/04/2020   COVID-19 Vaccine (3 - Moderna risk series) 11/20/2022*   Zoster (Shingles) Vaccine (1 of 2) 02/02/2023*   Screening for Lung Cancer  11/05/2023*   Colon Cancer Screening  07/30/2023   Pneumonia Vaccine  Completed   Flu Shot  Completed   Hepatitis C Screening: USPSTF Recommendation to screen - Ages 18-71 yo.  Completed   HPV Vaccine  Aged Out  *Topic was postponed. The date shown is not the original due date.

## 2022-11-04 NOTE — Progress Notes (Signed)
Name: Todd Hutchinson   MRN: [id number]    DOB: 1951/05/10   Date:11/04/2022       Progress Note  Chief Complaint  Patient presents with   Follow-up   Hyperlipidemia   Hypertension    Running elevated readings at home     Subjective:   Todd Hutchinson is a 72 y.o. male, presents to clinic for routine f/up  Hypertension:  Currently managed on meds below in med list - verified today Pt reports good med compliance and denies any SE.   Blood pressure today is well controlled. Home readings are also improved and at goal 130-138/60-66 average BP at home, very few are a little higher, HR 80-90's no palpitations BP Readings from Last 3 Encounters:  11/04/22 138/68  10/17/22 (!) 146/68  08/26/22 (!) 158/80   Pt denies CP, SOB, exertional sx, LE edema, palpitation, Ha's, visual disturbances, lightheadedness, hypotension, syncope.  Hyperlipidemia: Currently treated with pravastatin, pt reports good med compliance Last Lipids: Lab Results  Component Value Date   CHOL 149 11/05/2021   HDL 62 11/05/2021   LDLCALC 71 11/05/2021   TRIG 78 11/05/2021   CHOLHDL 2.4 11/05/2021   - Denies: Chest pain, shortness of breath, myalgias  PAD/claudication sx - seeing vascular now Doing statin but not ASA though his leg pain was not as severe when he was taking ASA  No hx of GI blood, no abdpain/GERD currently Denies blood in stool, urine, easy bruising/bleeding  Anemia - chronic - stable Hemoglobin  Date Value Ref Range Status  08/01/2022 11.4 (L) 13.2 - 17.1 g/dL Final  11/05/2021 11.1 (L) 13.2 - 17.1 g/dL Final  04/26/2021 11.9 (L) 13.2 - 17.1 g/dL Final  10/11/2020 12.1 (L) 13.2 - 17.1 g/dL Final  04/18/2015 10.8 (L) 12.6 - 17.7 g/dL Final   HGB  Date Value Ref Range Status  11/03/2013 12.2 (L) 13.0 - 18.0 g/dL Final   Lab Results  Component Value Date   IRON 61 11/12/2021   TIBC 303 11/12/2021   FERRITIN 34 11/12/2021     Colonscopy due later this year   Last was done in 2019  Gout - on allopurinol, no recent flares     Current Outpatient Medications:    allopurinol (ZYLOPRIM) 100 MG tablet, Take 1 tablet (100 mg total) by mouth daily., Disp: 90 tablet, Rfl: 1   amLODipine-benazepril (LOTREL) 10-40 MG capsule, Take 1 capsule by mouth daily., Disp: 90 capsule, Rfl: 3   bisoprolol (ZEBETA) 10 MG tablet, Take 1 tablet (10 mg total) by mouth daily., Disp: 90 tablet, Rfl: 3   chlorthalidone (HYGROTON) 50 MG tablet, TAKE 1 TABLET DAILY (DOSE   INCREASE), Disp: 90 tablet, Rfl: 0   Cholecalciferol (VITAMIN D-3) 1000 units CAPS, Take 1 capsule (1,000 Units total) by mouth daily., Disp: , Rfl:    cilostazol (PLETAL) 100 MG tablet, Take 1 tablet (100 mg total) by mouth 2 (two) times daily., Disp: 180 tablet, Rfl: 3   indomethacin (INDOCIN) 25 MG capsule, At first sign of pain/gout flare take 50 mg PO TID for 2 to 3 days and then decrease to 25 mg po TID x 2 d, Disp: 30 capsule, Rfl: 1   Omega-3 Fatty Acids (FISH OIL) 1000 MG CAPS, Take 1,000 mg by mouth daily., Disp: , Rfl:    pravastatin (PRAVACHOL) 40 MG tablet, Take 1 tablet (40 mg total) by mouth at bedtime., Disp: 90 tablet, Rfl: 0   spironolactone (ALDACTONE) 50 MG tablet, Take 1 tablet (  50 mg total) by mouth daily., Disp: 30 tablet, Rfl: 10  Patient Active Problem List   Diagnosis Date Noted   Abnormal ankle brachial index (ABI) 08/04/2022   Chronic gout of foot 08/01/2022   Anemia 10/11/2020   Irregular heart rhythm 10/11/2020   Class 2 severe obesity with serious comorbidity and body mass index (BMI) of 37.0 to 37.9 in adult [facility]) 11/02/2018   History of colonic polyps    Benign neoplasm of ascending colon    Benign neoplasm of cecum    Hypertensive nephrosclerosis 01/27/2018   Prediabetes 06/30/2017   COPD (chronic obstructive pulmonary disease) ([place]) 04/15/2017   Hx of tobacco use, presenting hazards to health 04/17/2016   Hyperlipidemia 03/13/2016   Benign essential hypertension  07/10/2015   Stage 3 chronic kidney disease ([place]) 07/10/2015   Cystitis, radiation 05/31/2012   Hematuria, microscopic 05/31/2012   ED (erectile dysfunction) of organic origin 05/31/2012   Hx of malignant neoplasm of prostate 05/31/2012    Past Surgical History:  Procedure Laterality Date   COLONOSCOPY WITH PROPOFOL N/A 07/29/2018   Procedure: COLONOSCOPY WITH BIOPSIES;  Surgeon: Lucilla Lame, MD;  Location: [place];  Service: Endoscopy;  Laterality: N/A;   POLYPECTOMY N/A 07/29/2018   Procedure: POLYPECTOMY;  Surgeon: Lucilla Lame, MD;  Location: [place];  Service: Endoscopy;  Laterality: N/A;   PROSTATE SURGERY      Family History  Problem Relation Age of Onset   Hypertension Mother    Aneurysm Mother 22       brain   Hypertension Father    Heart attack Father    Hypertension Sister    Hypertension Brother    Heart failure Brother    Heart disease Paternal Grandfather    Hypertension Sister    Hypertension Brother    Cancer Brother        throat cancer   Kidney cancer Neg Hx    Kidney failure Neg Hx    Prostate cancer Neg Hx    Sickle cell anemia Neg Hx    Tuberculosis Neg Hx     Social History   Tobacco Use   Smoking status: Former    Packs/day: 1.00    Years: 40.00    Total pack years: 40.00    Types: Cigarettes    Quit date: 11/2013    Years since quitting: 8.9   Smokeless tobacco: Never  Vaping Use   Vaping Use: Never used  Substance Use Topics   Alcohol use: Yes    Alcohol/week: 16.0 standard drinks of alcohol    Types: 16 Shots of liquor per week    Comment: no more than 4 whiskey drinks a day; maybe 4 times day    Drug use: No     Allergies  Allergen Reactions   Atorvastatin Rash    Health Maintenance  Topic Date Due   DTaP/Tdap/Td (1 - Tdap) Never done   Lung Cancer Screening  Never done   Medicare Annual Wellness (AWV)  07/04/2020   COVID-19 Vaccine (3 - Moderna risk series) 11/20/2022 (Originally 01/07/2020)    Zoster Vaccines- Shingrix (1 of 2) 02/02/2023 (Originally [birth year]/08/1970)   COLONOSCOPY (Pts 45-34yr Insurance coverage will need to be confirmed)  07/30/2023   Pneumonia Vaccine 72 Years old  Completed   INFLUENZA VACCINE  Completed   Hepatitis C Screening  Completed   HPV VACCINES  Aged Out    Chart Review Today: I personally reviewed active problem list, medication list, allergies, family history, social  history, health maintenance, notes from last encounter, lab results, imaging with the patient/caregiver today.   Review of Systems  Constitutional: Negative.   HENT: Negative.    Eyes: Negative.   Respiratory: Negative.    Cardiovascular: Negative.   Gastrointestinal: Negative.   Endocrine: Negative.   Genitourinary: Negative.   Musculoskeletal: Negative.   Skin: Negative.   Allergic/Immunologic: Negative.   Neurological: Negative.   Hematological: Negative.   Psychiatric/Behavioral: Negative.    All other systems reviewed and are negative.    Objective:   Vitals:   11/04/22 1049 11/04/22 1102  BP: (!) 142/70 138/68  Pulse: 94   Resp: 16   Temp: 97.9 F (36.6 C)   TempSrc: Oral   SpO2: 96%   Weight: 234 lb 4.8 oz (106.3 kg)   Height: 5' 7"$  (1.702 m)     Body mass index is 36.7 kg/m.  Physical Exam Vitals and nursing note reviewed.  Constitutional:      General: He is not in acute distress.    Appearance: Normal appearance. He is obese. He is not ill-appearing, toxic-appearing or diaphoretic.  HENT:     Head: Normocephalic and atraumatic.     Right Ear: External ear normal.     Left Ear: External ear normal.     Nose: Nose normal.     Mouth/Throat:     Mouth: Mucous membranes are moist.     Pharynx: Oropharynx is clear. No oropharyngeal exudate or posterior oropharyngeal erythema.  Eyes:     General: No scleral icterus.       Right eye: No discharge.        Left eye: No discharge.     Conjunctiva/sclera: Conjunctivae normal.  Cardiovascular:     Rate  and Rhythm: Normal rate and regular rhythm.     Pulses: Normal pulses.     Heart sounds: Normal heart sounds. No murmur heard.    No friction rub. No gallop.  Pulmonary:     Effort: Pulmonary effort is normal. No respiratory distress.     Breath sounds: Normal breath sounds. No stridor. No wheezing, rhonchi or rales.  Musculoskeletal:     Cervical back: Normal range of motion.     Right lower leg: Edema present.     Left lower leg: Edema present.  Lymphadenopathy:     Cervical: No cervical adenopathy.  Skin:    General: Skin is warm.     Capillary Refill: Capillary refill takes less than 2 seconds.     Coloration: Skin is not jaundiced or pale.     Findings: No bruising, erythema or lesion.  Neurological:     Mental Status: He is alert. Mental status is at baseline.     Gait: Gait normal.  Psychiatric:        Mood and Affect: Mood normal.         Assessment & Plan:   Problem List Items Addressed This Visit       Cardiovascular and Mediastinum   Benign essential hypertension - Primary    Currently well controlled - managed by cardiology- Dr. Garen Lah BP has been uncontrolled recently, maxed out on multiple medications - bisoprolol, amlodipine-benazepril, chlorthalidone, spironolactone He was encouraged to work on diet/lifestyle efforts His home readings he brings in today are improved - average at home most days is 130/60 BP Readings from Last 3 Encounters:  11/04/22 138/68  10/17/22 (!) 146/68  08/26/22 (!) 158/80  Continue meds per cardiology and DASH  Relevant Orders   COMPLETE METABOLIC PANEL WITH GFR   Atherosclerosis of native artery of both lower extremities with intermittent claudication ([place])    He is now seeing vascular specialist-with symptoms for a few years but recent diagnosis Is cholesterol has been well-controlled on pravastatin -rechecking lipid panel today He is hesitant to take a baby aspirin daily -reports that he is concerned about  his kidneys Reviewed the MOA of ASA and benefit with PAD - he does not have any contraindications, and we did review the bleeding risk Patient was encouraged to take 81 mg aspirin daily if any bleeding concerns or side effects encouraged him to stop the medicine and notify our office right away        Respiratory   COPD (chronic obstructive pulmonary disease) ([place])    Quit smoking 9 years ago, denies any current symptoms, not requiring inhalers and he does not wish to consult with pulmonary        Musculoskeletal and Integument   Chronic gout of foot    Managed on allopurinol, no recent gout flares        Genitourinary   Stage 3 chronic kidney disease ([place])    Monitoring renal function Last eGFR 60, continue optimizing BP, avoiding nephrotoxic meds        Other   Hyperlipidemia    Has been managed with pravastatin 40 mg, he endorses good med compliance without any side effects Last lipid panel was about a year ago we will recheck today      Relevant Orders   COMPLETE METABOLIC PANEL WITH GFR   Lipid panel   Hx of tobacco use, presenting hazards to health    Lengthy discussion of low-dose CT chest for lung cancer screening and benefits but patient denies/refuses today.  He quit smoking about 9 years ago -I explained to him that he has about 5 to 6 years that he would be a candidate for the cancer screening      Prediabetes    A1c was rechecked about 3 months ago and was 5.5 in normal range continue diet lifestyle efforts      Class 2 severe obesity with serious comorbidity and body mass index (BMI) of 37.0 to 37.9 in adult ([facility])    Slight decrease in his weight and BMI, he has been counseled and strongly encouraged to work on diet and lifestyle efforts Multiple comorbidities including COPD, prediabetes, hyperlipidemia, hypertension, peripheral artery disease      Anemia    Years of anemia which appears to have fairly stable hemoglobin We previously did iron panel which  was normal He has had colonoscopies done previously Unclear if its anemia secondary to chronic disease? I will research the chart to see if there is anything else that needs to be done to work this up He currently denies any GI upset, history of GI bleed, blood in stool, hematuria, easy bleeding or bruising Hemoglobin  Date Value Ref Range Status  08/01/2022 11.4 (L) 13.2 - 17.1 g/dL Final  11/05/2021 11.1 (L) 13.2 - 17.1 g/dL Final  04/26/2021 11.9 (L) 13.2 - 17.1 g/dL Final  10/11/2020 12.1 (L) 13.2 - 17.1 g/dL Final  04/18/2015 10.8 (L) 12.6 - 17.7 g/dL Final   HGB  Date Value Ref Range Status  11/03/2013 12.2 (L) 13.0 - 18.0 g/dL Final   Lab Results  Component Value Date   IRON 61 11/12/2021   TIBC 303 11/12/2021   FERRITIN 34 11/12/2021  Lost colonoscopy 2019 due later  this year      Relevant Orders   CBC with Differential/Platelet   Other Visit Diagnoses     Encounter for medication monitoring       Relevant Orders   COMPLETE METABOLIC PANEL WITH GFR   Lipid panel   CBC with Differential/Platelet   Need for shingles vaccine       discussed effectiveness, benefits, SE - sent to pharmacy   Relevant Medications   Zoster Vaccine Adjuvanted [facility]) injection   Prostate cancer ([place])   (Chronic)     hx of - managed and monitored by urology      Health Maintenance  Topic Date Due   COVID-19 Vaccine (3 - Moderna risk series) 11/20/2022*   Medicare Annual Wellness Visit  12/16/2022*   Zoster (Shingles) Vaccine (1 of 2) 02/02/2023*   Screening for Lung Cancer  11/05/2023*   Colon Cancer Screening  07/30/2023   Pneumonia Vaccine  Completed   Flu Shot  Completed   Hepatitis C Screening: USPSTF Recommendation to screen - Ages 18-79 yo.  Completed   HPV Vaccine  Aged Out   DTaP/Tdap/Td vaccine  Discontinued  *Topic was postponed. The date shown is not the original due date.   HM reviewed with pt -he refused lung cancer screening, Medicare well visit, updating  tetanus though this is not covered by insurance  No follow-ups on file.   Delsa Grana, PA-C 11/04/22 11:33 AM

## 2022-11-04 NOTE — Assessment & Plan Note (Signed)
Monitoring renal function Last eGFR 60, continue optimizing BP, avoiding nephrotoxic meds

## 2022-11-04 NOTE — Assessment & Plan Note (Signed)
Managed on allopurinol, no recent gout flares

## 2022-11-04 NOTE — Assessment & Plan Note (Signed)
Quit smoking 9 years ago, denies any current symptoms, not requiring inhalers and he does not wish to consult with pulmonary

## 2022-11-04 NOTE — Assessment & Plan Note (Signed)
Slight decrease in his weight and BMI, he has been counseled and strongly encouraged to work on diet and lifestyle efforts Multiple comorbidities including COPD, prediabetes, hyperlipidemia, hypertension, peripheral artery disease

## 2022-11-04 NOTE — Assessment & Plan Note (Signed)
Has been managed with pravastatin 40 mg, he endorses good med compliance without any side effects Last lipid panel was about a year ago we will recheck today

## 2022-11-04 NOTE — Assessment & Plan Note (Signed)
Lengthy discussion of low-dose CT chest for lung cancer screening and benefits but patient denies/refuses today.  He quit smoking about 9 years ago -I explained to him that he has about 5 to 6 years that he would be a candidate for the cancer screening

## 2022-11-04 NOTE — Assessment & Plan Note (Signed)
He is now seeing vascular specialist-with symptoms for a few years but recent diagnosis Is cholesterol has been well-controlled on pravastatin -rechecking lipid panel today He is hesitant to take a baby aspirin daily -reports that he is concerned about his kidneys Reviewed the MOA of ASA and benefit with PAD - he does not have any contraindications, and we did review the bleeding risk Patient was encouraged to take 81 mg aspirin daily if any bleeding concerns or side effects encouraged him to stop the medicine and notify our office right away

## 2022-11-04 NOTE — Assessment & Plan Note (Signed)
Currently well controlled - managed by cardiology- Dr. Garen Lah BP has been uncontrolled recently, maxed out on multiple medications - bisoprolol, amlodipine-benazepril, chlorthalidone, spironolactone He was encouraged to work on diet/lifestyle efforts His home readings he brings in today are improved - average at home most days is 130/60 BP Readings from Last 3 Encounters:  11/04/22 138/68  10/17/22 (!) 146/68  08/26/22 (!) 158/80  Continue meds per cardiology and DASH

## 2022-11-05 LAB — COMPLETE METABOLIC PANEL WITH GFR
AG Ratio: 1.2 (calc) (ref 1.0–2.5)
ALT: 34 U/L (ref 9–46)
AST: 31 U/L (ref 10–35)
Albumin: 4.2 g/dL (ref 3.6–5.1)
Alkaline phosphatase (APISO): 33 U/L — ABNORMAL LOW (ref 35–144)
BUN/Creatinine Ratio: 28 (calc) — ABNORMAL HIGH (ref 6–22)
BUN: 46 mg/dL — ABNORMAL HIGH (ref 7–25)
CO2: 18 mmol/L — ABNORMAL LOW (ref 20–32)
Calcium: 10.4 mg/dL — ABNORMAL HIGH (ref 8.6–10.3)
Chloride: 110 mmol/L (ref 98–110)
Creat: 1.67 mg/dL — ABNORMAL HIGH (ref 0.70–1.28)
Globulin: 3.4 g/dL (calc) (ref 1.9–3.7)
Glucose, Bld: 91 mg/dL (ref 65–99)
Potassium: 4.8 mmol/L (ref 3.5–5.3)
Sodium: 143 mmol/L (ref 135–146)
Total Bilirubin: 0.4 mg/dL (ref 0.2–1.2)
Total Protein: 7.6 g/dL (ref 6.1–8.1)
eGFR: 44 mL/min/{1.73_m2} — ABNORMAL LOW (ref >=60)

## 2022-11-05 LAB — CBC WITH DIFFERENTIAL/PLATELET
Absolute Monocytes: 689 cells/uL (ref 200–950)
Basophils Absolute: 49 cells/uL (ref 0–200)
Basophils Relative: 0.6 %
Eosinophils Absolute: 148 cells/uL (ref 15–500)
Eosinophils Relative: 1.8 %
HCT: 36.5 % — ABNORMAL LOW (ref 38.5–50.0)
Hemoglobin: 12 g/dL — ABNORMAL LOW (ref 13.2–17.1)
Lymphs Abs: 1205 cells/uL (ref 850–3900)
MCH: 28.4 pg (ref 27.0–33.0)
MCHC: 32.9 g/dL (ref 32.0–36.0)
MCV: 86.5 fL (ref 80.0–100.0)
MPV: 12.9 fL — ABNORMAL HIGH (ref 7.5–12.5)
Monocytes Relative: 8.4 %
Neutro Abs: 6109 cells/uL (ref 1500–7800)
Neutrophils Relative %: 74.5 %
Platelets: 244 10*3/uL (ref 140–400)
RBC: 4.22 10*6/uL (ref 4.20–5.80)
RDW: 14.2 % (ref 11.0–15.0)
Total Lymphocyte: 14.7 %
WBC: 8.2 10*3/uL (ref 3.8–10.8)

## 2022-11-05 LAB — LIPID PANEL
Cholesterol: 167 mg/dL (ref <200)
HDL: 122 mg/dL (ref >=40)
LDL Cholesterol (Calc): 33 mg/dL (calc)
Non-HDL Cholesterol (Calc): 45 mg/dL (calc) (ref <130)
Total CHOL/HDL Ratio: 1.4 (calc) (ref <5.0)
Triglycerides: 43 mg/dL (ref <150)

## 2022-11-11 ENCOUNTER — Other Ambulatory Visit: Payer: Self-pay | Admitting: Family Medicine

## 2022-11-11 DIAGNOSIS — N1832 Chronic kidney disease, stage 3b: Secondary | ICD-10-CM

## 2022-11-11 DIAGNOSIS — G4733 Obstructive sleep apnea (adult) (pediatric): Secondary | ICD-10-CM | POA: Diagnosis not present

## 2022-11-25 ENCOUNTER — Other Ambulatory Visit (INDEPENDENT_AMBULATORY_CARE_PROVIDER_SITE_OTHER): Payer: Self-pay | Admitting: Nurse Practitioner

## 2022-11-25 DIAGNOSIS — I70213 Atherosclerosis of native arteries of extremities with intermittent claudication, bilateral legs: Secondary | ICD-10-CM

## 2022-11-25 DIAGNOSIS — E119 Type 2 diabetes mellitus without complications: Secondary | ICD-10-CM | POA: Diagnosis not present

## 2022-11-25 DIAGNOSIS — H2513 Age-related nuclear cataract, bilateral: Secondary | ICD-10-CM | POA: Diagnosis not present

## 2022-11-25 DIAGNOSIS — H35033 Hypertensive retinopathy, bilateral: Secondary | ICD-10-CM | POA: Diagnosis not present

## 2022-11-26 ENCOUNTER — Ambulatory Visit (INDEPENDENT_AMBULATORY_CARE_PROVIDER_SITE_OTHER): Payer: Medicare HMO

## 2022-11-26 ENCOUNTER — Ambulatory Visit (INDEPENDENT_AMBULATORY_CARE_PROVIDER_SITE_OTHER): Payer: Medicare HMO | Admitting: Nurse Practitioner

## 2022-11-26 ENCOUNTER — Encounter (INDEPENDENT_AMBULATORY_CARE_PROVIDER_SITE_OTHER): Payer: Self-pay | Admitting: Nurse Practitioner

## 2022-11-26 VITALS — BP 157/70 | HR 61 | Resp 16 | Wt 234.2 lb

## 2022-11-26 DIAGNOSIS — I70213 Atherosclerosis of native arteries of extremities with intermittent claudication, bilateral legs: Secondary | ICD-10-CM | POA: Diagnosis not present

## 2022-11-26 DIAGNOSIS — I10 Essential (primary) hypertension: Secondary | ICD-10-CM | POA: Diagnosis not present

## 2022-11-26 DIAGNOSIS — E785 Hyperlipidemia, unspecified: Secondary | ICD-10-CM

## 2022-12-01 LAB — VAS US ABI WITH/WO TBI
Left ABI: 0.96
Right ABI: 0.87

## 2022-12-05 ENCOUNTER — Ambulatory Visit: Payer: Medicare HMO | Attending: Cardiology | Admitting: Cardiology

## 2022-12-05 ENCOUNTER — Encounter: Payer: Self-pay | Admitting: Cardiology

## 2022-12-05 VITALS — BP 140/60 | HR 77 | Ht 67.0 in | Wt 234.2 lb

## 2022-12-05 DIAGNOSIS — I10 Essential (primary) hypertension: Secondary | ICD-10-CM

## 2022-12-05 DIAGNOSIS — E785 Hyperlipidemia, unspecified: Secondary | ICD-10-CM

## 2022-12-05 MED ORDER — HYDRALAZINE HCL 50 MG PO TABS: 50.0000 mg | ORAL_TABLET | Freq: Two times a day (BID) | ORAL | 3 refills | Status: DC

## 2022-12-05 MED ORDER — EZETIMIBE 10 MG PO TABS: 10.0000 mg | ORAL_TABLET | Freq: Every day | ORAL | 3 refills | Status: DC

## 2022-12-05 NOTE — Patient Instructions (Signed)
Medication Instructions:   Your physician has recommended you make the following change in your medication:   STOP Pravastatin STOP Spirolactone START Hydralazine - take one tablet ( 50mg ) by mouth daily.  4. START Zetia - take one tablet ( 10mg ) by mouth daily.   *If you need a refill on your cardiac medications before your next appointment, please call your pharmacy*   Lab Work:  Your physician recommends that you return for lab work in: 10 days (on or around April 1st. ) at the medical mall. No appt is needed. Hours are M-F 7AM- 6 PM.  If you have labs (blood work) drawn today and your tests are completely normal, you will receive your results only by: [place] (if you have MyChart) OR A paper copy in the mail If you have any lab test that is abnormal or we need to change your treatment, we will call you to review the results.   Testing/Procedures:     Follow-Up: At [facility], you and your health needs are our priority.  As part of our continuing mission to provide you with exceptional heart care, we have created designated Provider Care Teams.  These Care Teams include your primary Cardiologist (physician) and Advanced Practice Providers (APPs -  Physician Assistants and Nurse Practitioners) who all work together to provide you with the care you need, when you need it.  We recommend signing up for the patient portal called "MyChart".  Sign up information is provided on this After Visit Summary.  MyChart is used to connect with patients for Virtual Visits (Telemedicine).  Patients are able to view lab/test results, encounter notes, upcoming appointments, etc.  Non-urgent messages can be sent to your provider as well.   To learn more about what you can do with MyChart, go to [email or web address].    Your next appointment:   2 - 3 month(s)  Provider:   You may see Kate Sable, MD or one of the following Advanced Practice Providers on your designated  Care Team:   Murray Hodgkins, NP Christell Faith, PA-C Cadence Kathlen Mody, PA-C Gerrie Nordmann, NP

## 2022-12-05 NOTE — Progress Notes (Signed)
Cardiology Office Note:    Date:  12/05/2022   ID:  Todd Hutchinson, DOB [birth year], MRN [id number]  PCP:  Delsa Grana, PA-C   [facility] Providers Cardiologist:  Kate Sable, MD     Referring MD: [place]   Chief Complaint  Patient presents with   6 weeks follow up     Patient stopped the Cilostazol after 2 weeks due causing an upset stomach. Medications reviewed by the patient verbally.     History of Present Illness:    Todd Hutchinson is a 72 y.o. male with a hx of hypertension, hyperlipidemia, PAD, COPD, former smoker x40+ years who presents for follow-up.    Being seen for hypertension, diagnosed with moderate PAD, started on cilostazol but this caused stomach upset.  Also complaining of myalgias with Pravachol.  Pravachol was stopped with improvement in symptoms.  Blood pressure still elevated with range in the 130s to 150s.  Last BMP showed worsening creatinine.  Prior notes  Lower extremity ultrasound 07/2022 right ABI 0.7, left ABI 0.79 Renal artery ultrasound 01/2022 mild RAS, 1 to 59% previously was on carvedilol but did not tolerate due to abdominal discomfort and diarrhea.     Past Medical History:  Diagnosis Date   Abnormal weight gain 03/13/2016   COPD (chronic obstructive pulmonary disease) (HCC)    Hx of malignant neoplasm of prostate 05/31/2012   Hx of tobacco use, presenting hazards to health 04/17/2016   Hypercholesterolemia 03/13/2016   Hyperlipidemia    Hypertension    Hypertensive nephrosclerosis 01/27/2018   Prostate cancer ([place])    Sleep apnea    uses CPAP    Past Surgical History:  Procedure Laterality Date   COLONOSCOPY WITH PROPOFOL N/A 07/29/2018   Procedure: COLONOSCOPY WITH BIOPSIES;  Surgeon: Lucilla Lame, MD;  Location: [place];  Service: Endoscopy;  Laterality: N/A;   POLYPECTOMY N/A 07/29/2018   Procedure: POLYPECTOMY;  Surgeon: Lucilla Lame, MD;  Location: [place];   Service: Endoscopy;  Laterality: N/A;   PROSTATE SURGERY      Current Medications: Current Meds  Medication Sig   allopurinol (ZYLOPRIM) 100 MG tablet Take 1 tablet (100 mg total) by mouth daily.   amLODipine-benazepril (LOTREL) 10-40 MG capsule Take 1 capsule by mouth daily.   bisoprolol (ZEBETA) 10 MG tablet Take 1 tablet (10 mg total) by mouth daily.   chlorthalidone (HYGROTON) 50 MG tablet TAKE 1 TABLET DAILY (DOSE   INCREASE)   Cholecalciferol (VITAMIN D-3) 1000 units CAPS Take 1 capsule (1,000 Units total) by mouth daily.   ezetimibe (ZETIA) 10 MG tablet Take 1 tablet (10 mg total) by mouth daily.   hydrALAZINE (APRESOLINE) 50 MG tablet Take 1 tablet (50 mg total) by mouth 2 (two) times daily.   indomethacin (INDOCIN) 25 MG capsule At first sign of pain/gout flare take 50 mg PO TID for 2 to 3 days and then decrease to 25 mg po TID x 2 d   Omega-3 Fatty Acids (FISH OIL) 1000 MG CAPS Take 1,000 mg by mouth daily.   [DISCONTINUED] pravastatin (PRAVACHOL) 40 MG tablet Take 1 tablet (40 mg total) by mouth at bedtime.   [DISCONTINUED] spironolactone (ALDACTONE) 50 MG tablet Take 1 tablet (50 mg total) by mouth daily.     Allergies:   Atorvastatin   Social History   Socioeconomic History   Marital status: Married    Spouse name: Dina Rich    Number of children: 1   Years of education: Not  on file   Highest education level: 12th grade  Occupational History    Comment: Retired   Tobacco Use   Smoking status: Former    Packs/day: 1.00    Years: 40.00    Additional pack years: 0.00    Total pack years: 40.00    Types: Cigarettes    Quit date: 11/2013    Years since quitting: 9.0   Smokeless tobacco: Never  Vaping Use   Vaping Use: Never used  Substance and Sexual Activity   Alcohol use: Yes    Alcohol/week: 16.0 standard drinks of alcohol    Types: 16 Shots of liquor per week    Comment: no more than 4 whiskey drinks a day; maybe 4 times day    Drug use: No   Sexual  activity: Never    Partners: Female  Other Topics Concern   Not on file  Social History Narrative   Not on file   Social Determinants of Health   Financial Resource Strain: Low Risk  (07/02/2018)   Overall Financial Resource Strain (CARDIA)    Difficulty of Paying Living Expenses: Not hard at all  Food Insecurity: No Food Insecurity (07/02/2018)   Hunger Vital Sign    Worried About Running Out of Food in the Last Year: Never true    [place] in the Last Year: Never true  Transportation Needs: No Transportation Needs (04/02/2020)   PRAPARE - Hydrologist (Medical): No    Lack of Transportation (Non-Medical): No  Physical Activity: Inactive (07/05/2019)   Exercise Vital Sign    Days of Exercise per Week: 0 days    Minutes of Exercise per Session: 0 min  Stress: No Stress Concern Present (07/02/2018)   [place]    Feeling of Stress : Not at all  Social Connections: Moderately Integrated (07/02/2018)   Social Connection and Isolation Panel [NHANES]    Frequency of Communication with Friends and Family: More than three times a week    Frequency of Social Gatherings with Friends and Family: Once a week    Attends Religious Services: More than 4 times per year    Active Member of [employer] or Organizations: No    Attends Music therapist: Never    Marital Status: Married     Family History: The patient's family history includes Aneurysm (age of onset: 34) in his mother; Cancer in his brother; Heart attack in his father; Heart disease in his paternal grandfather; Heart failure in his brother; Hypertension in his brother, brother, father, mother, sister, and sister. There is no history of Kidney cancer, Kidney failure, Prostate cancer, Sickle cell anemia, or Tuberculosis.  ROS:   Please see the history of present illness.     All other systems reviewed and are  negative.  EKGs/Labs/Other Studies Reviewed:    The following studies were reviewed today:   EKG:  EKG not ordered today.   Recent Labs: 11/04/2022: ALT 34; BUN 46; Creat 1.67; Hemoglobin 12.0; Platelets 244; Potassium 4.8; Sodium 143  Recent Lipid Panel    Component Value Date/Time   CHOL 167 11/04/2022 1218   CHOL 139 04/18/2015 0823   TRIG 43 11/04/2022 1218   HDL 122 11/04/2022 1218   HDL 46 04/18/2015 0823   CHOLHDL 1.4 11/04/2022 1218   VLDL 46 (H) 04/15/2017 0921   LDLCALC 33 11/04/2022 1218     Risk Assessment/Calculations:  Physical Exam:    VS:  BP (!) 140/60 (BP Location: Left Arm, Patient Position: Sitting, Cuff Size: Normal)   Pulse 77   Ht 5\' 7"  (1.702 m)   Wt 234 lb 4 oz (106.3 kg)   SpO2 93%   BMI 36.69 kg/m     Wt Readings from Last 3 Encounters:  12/05/22 234 lb 4 oz (106.3 kg)  11/26/22 234 lb 3.2 oz (106.2 kg)  11/04/22 234 lb 4.8 oz (106.3 kg)     GEN:  Well nourished, well developed in no acute distress HEENT: Normal NECK: No JVD; No carotid bruits CARDIAC: RRR, no murmurs, rubs, gallops RESPIRATORY:  Clear to auscultation without rales, wheezing or rhonchi  ABDOMEN: Soft, non-tender, non-distended MUSCULOSKELETAL:  No edema; No deformity  SKIN: Warm and dry NEUROLOGIC:  Alert and oriented x 3 PSYCHIATRIC:  Normal affect   ASSESSMENT:    1. Primary hypertension   2. Hyperlipidemia, unspecified hyperlipidemia type    PLAN:    In order of problems listed above:  Hypertension, BP elevated.  Creatinine worsened.  Stop Aldactone.  Start hydralazine 50 mg twice daily.  Cont , bisoprolol to 10 mg daily, chlorthalidone 25, Lotrel 10-40mg  qd as prescribed.  Renal artery ultrasound showed mild nonobstructive stenosis.  Titrate hydralazine if BP stays elevated at follow-up visit.  Check BMP in 10 days.  Follow-up with Dr. Fletcher Anon for renal denervation consideration Hyperlipidemia, myalgias with Pravachol.  Start Zetia. . Follow-up  in 2 months..  Medication Adjustments/Labs and Tests Ordered: Current medicines are reviewed at length with the patient today.  Concerns regarding medicines are outlined above.  Orders Placed This Encounter  Procedures   Basic Metabolic Panel (BMET)    Meds ordered this encounter  Medications   hydrALAZINE (APRESOLINE) 50 MG tablet    Sig: Take 1 tablet (50 mg total) by mouth 2 (two) times daily.    Dispense:  180 tablet    Refill:  3   ezetimibe (ZETIA) 10 MG tablet    Sig: Take 1 tablet (10 mg total) by mouth daily.    Dispense:  90 tablet    Refill:  3     Patient Instructions  Medication Instructions:   Your physician has recommended you make the following change in your medication:   STOP Pravastatin STOP Spirolactone START Hydralazine - take one tablet ( 50mg ) by mouth daily.  4. START Zetia - take one tablet ( 10mg ) by mouth daily.   *If you need a refill on your cardiac medications before your next appointment, please call your pharmacy*   Lab Work:  Your physician recommends that you return for lab work in: 10 days (on or around April 1st. ) at the medical mall. No appt is needed. Hours are M-F 7AM- 6 PM.  If you have labs (blood work) drawn today and your tests are completely normal, you will receive your results only by: [place] (if you have MyChart) OR A paper copy in the mail If you have any lab test that is abnormal or we need to change your treatment, we will call you to review the results.   Testing/Procedures:     Follow-Up: At [facility], you and your health needs are our priority.  As part of our continuing mission to provide you with exceptional heart care, we have created designated Provider Care Teams.  These Care Teams include your primary Cardiologist (physician) and Advanced Practice Providers (APPs -  Physician Assistants and Nurse Practitioners) who  all work together to provide you with the care you need, when you need  it.  We recommend signing up for the patient portal called "MyChart".  Sign up information is provided on this After Visit Summary.  MyChart is used to connect with patients for Virtual Visits (Telemedicine).  Patients are able to view lab/test results, encounter notes, upcoming appointments, etc.  Non-urgent messages can be sent to your provider as well.   To learn more about what you can do with MyChart, go to [email or web address].    Your next appointment:   2 - 3 month(s)  Provider:   You may see Kate Sable, MD or one of the following Advanced Practice Providers on your designated Care Team:   Murray Hodgkins, NP Christell Faith, PA-C Cadence Kathlen Mody, PA-C Gerrie Nordmann, NP    Signed, Kate Sable, MD  12/05/2022 1:16 PM    [place]

## 2022-12-09 ENCOUNTER — Encounter (INDEPENDENT_AMBULATORY_CARE_PROVIDER_SITE_OTHER): Payer: Self-pay | Admitting: Nurse Practitioner

## 2022-12-09 NOTE — Progress Notes (Signed)
Subjective:    Patient ID: Todd Hutchinson, male    DOB: [birth year], 72 y.o.   MRN: [id number] Chief Complaint  Patient presents with   Follow-up    Ultrasound follow up    The patient returns to the office for followup and review of the noninvasive studies.   Patient continues to have claudication-like symptoms.  He notes that it is more so weakness than pain or cramping.  No interval shortening of the patient's claudication distance or development of rest pain symptoms. No new ulcers or wounds have occurred since the last visit.  There have been no significant changes to the patient's overall health care.  The patient denies amaurosis fugax or recent TIA symptoms. There are no documented recent neurological changes noted. There is no history of DVT, PE or superficial thrombophlebitis. The patient denies recent episodes of angina or shortness of breath.   ABI Rt=0.87 and Lt=0.96  (previous ABI's Rt=0.72 and Lt=0.79) Duplex ultrasound of the bilateral tibial arteries shows biphasic waveforms with dampened toe waveforms on the right and normal on the left  Additional bilateral lower extremity arterial duplexes shows triphasic/biphasic waveforms bilaterally.    Review of Systems  Neurological:  Positive for weakness.  All other systems reviewed and are negative.      Objective:   Physical Exam Vitals reviewed.  HENT:     Head: Normocephalic.  Cardiovascular:     Rate and Rhythm: Normal rate.     Pulses:          Dorsalis pedis pulses are detected w/ Doppler on the right side and detected w/ Doppler on the left side.       Posterior tibial pulses are detected w/ Doppler on the right side and detected w/ Doppler on the left side.  Pulmonary:     Effort: Pulmonary effort is normal.  Skin:    General: Skin is warm and dry.  Neurological:     Mental Status: He is alert and oriented to person, place, and time.  Psychiatric:        Mood and Affect: Mood normal.         Behavior: Behavior normal.        Thought Content: Thought content normal.        Judgment: Judgment normal.     BP (!) 157/70 (BP Location: Left Arm)   Pulse 61   Resp 16   Wt 234 lb 3.2 oz (106.2 kg)   BMI 36.68 kg/m   Past Medical History:  Diagnosis Date   Abnormal weight gain 03/13/2016   COPD (chronic obstructive pulmonary disease) (HCC)    Hx of malignant neoplasm of prostate 05/31/2012   Hx of tobacco use, presenting hazards to health 04/17/2016   Hypercholesterolemia 03/13/2016   Hyperlipidemia    Hypertension    Hypertensive nephrosclerosis 01/27/2018   Prostate cancer ([place])    Sleep apnea    uses CPAP    Social History   Socioeconomic History   Marital status: Married    Spouse name: Dina Rich    Number of children: 1   Years of education: Not on file   Highest education level: 12th grade  Occupational History    Comment: Retired   Tobacco Use   Smoking status: Former    Packs/day: 1.00    Years: 40.00    Additional pack years: 0.00    Total pack years: 40.00    Types: Cigarettes    Quit date: 11/2013    Years  since quitting: 9.0   Smokeless tobacco: Never  Vaping Use   Vaping Use: Never used  Substance and Sexual Activity   Alcohol use: Yes    Alcohol/week: 16.0 standard drinks of alcohol    Types: 16 Shots of liquor per week    Comment: no more than 4 whiskey drinks a day; maybe 4 times day    Drug use: No   Sexual activity: Never    Partners: Female  Other Topics Concern   Not on file  Social History Narrative   Not on file   Social Determinants of Health   Financial Resource Strain: Low Risk  (07/02/2018)   Overall Financial Resource Strain (CARDIA)    Difficulty of Paying Living Expenses: Not hard at all  Food Insecurity: No Food Insecurity (07/02/2018)   Hunger Vital Sign    Worried About Running Out of Food in the Last Year: Never true    [place] in the Last Year: Never true  Transportation Needs: No Transportation Needs  (04/02/2020)   PRAPARE - Hydrologist (Medical): No    Lack of Transportation (Non-Medical): No  Physical Activity: Inactive (07/05/2019)   Exercise Vital Sign    Days of Exercise per Week: 0 days    Minutes of Exercise per Session: 0 min  Stress: No Stress Concern Present (07/02/2018)   [place]    Feeling of Stress : Not at all  Social Connections: Moderately Integrated (07/02/2018)   Social Connection and Isolation Panel [NHANES]    Frequency of Communication with Friends and Family: More than three times a week    Frequency of Social Gatherings with Friends and Family: Once a week    Attends Religious Services: More than 4 times per year    Active Member of [employer] or Organizations: No    Attends Archivist Meetings: Never    Marital Status: Married  Human resources officer Violence: Not At Risk (07/02/2018)   Humiliation, Afraid, Rape, and Kick questionnaire    Fear of Current or Ex-Partner: No    Emotionally Abused: No    Physically Abused: No    Sexually Abused: No    Past Surgical History:  Procedure Laterality Date   COLONOSCOPY WITH PROPOFOL N/A 07/29/2018   Procedure: COLONOSCOPY WITH BIOPSIES;  Surgeon: Lucilla Lame, MD;  Location: [place];  Service: Endoscopy;  Laterality: N/A;   POLYPECTOMY N/A 07/29/2018   Procedure: POLYPECTOMY;  Surgeon: Lucilla Lame, MD;  Location: [place];  Service: Endoscopy;  Laterality: N/A;   PROSTATE SURGERY      Family History  Problem Relation Age of Onset   Hypertension Mother    Aneurysm Mother 64       brain   Hypertension Father    Heart attack Father    Hypertension Sister    Hypertension Brother    Heart failure Brother    Heart disease Paternal Grandfather    Hypertension Sister    Hypertension Brother    Cancer Brother        throat cancer   Kidney cancer Neg Hx    Kidney failure Neg Hx     Prostate cancer Neg Hx    Sickle cell anemia Neg Hx    Tuberculosis Neg Hx     Allergies  Allergen Reactions   Atorvastatin Rash       Latest Ref Rng & Units 11/04/2022   12:18 PM 08/01/2022  12:10 PM 11/05/2021    9:39 AM  CBC  WBC 3.8 - 10.8 Thousand/uL 8.2  9.6  8.9   Hemoglobin 13.2 - 17.1 g/dL 12.0  11.4  11.1   Hematocrit 38.5 - 50.0 % 36.5  34.7  35.6   Platelets 140 - 400 Thousand/uL 244  220  241       CMP     Component Value Date/Time   NA 143 11/04/2022 1218   NA 141 11/14/2021 0958   K 4.8 11/04/2022 1218   CL 110 11/04/2022 1218   CO2 18 (L) 11/04/2022 1218   GLUCOSE 91 11/04/2022 1218   BUN 46 (H) 11/04/2022 1218   BUN 21 11/14/2021 0958   CREATININE 1.67 (H) 11/04/2022 1218   CALCIUM 10.4 (H) 11/04/2022 1218   PROT 7.6 11/04/2022 1218   PROT 7.1 06/18/2015 0916   ALBUMIN 4.4 04/15/2017 0921   ALBUMIN 4.7 06/18/2015 0916   AST 31 11/04/2022 1218   ALT 34 11/04/2022 1218   ALKPHOS 36 (L) 04/15/2017 0921   BILITOT 0.4 11/04/2022 1218   BILITOT 0.3 06/18/2015 0916   GFRNONAA 62 10/11/2020 1236   GFRAA 72 10/11/2020 1236     VAS [place] ABI WITH/WO TBI  Result Date: 12/01/2022  LOWER EXTREMITY DOPPLER STUDY Patient Name:  Marlo Bossert  Date of Exam:   11/26/2022 Medical Rec #: [id number]               Accession #:    [id number] Date of Birth: [birth year]               Patient Gender: M Patient Age:   43 years Exam Location:  [facility] Procedure:      VAS [place] ABI WITH/WO TBI Referring Phys: Leotis Pain --------------------------------------------------------------------------------  Indications: Claudication.  Performing Technologist: Almira Coaster RVS  Examination Guidelines: A complete evaluation includes at minimum, Doppler waveform signals and systolic blood pressure reading at the level of bilateral brachial, anterior tibial, and posterior tibial arteries, when vessel segments are accessible. Bilateral testing is considered an integral  part of a complete examination. Photoelectric Plethysmograph (PPG) waveforms and toe systolic pressure readings are included as required and additional duplex testing as needed. Limited examinations for reoccurring indications may be performed as noted.  ABI Findings: +---------+------------------+-----+--------+--------+ Right    Rt Pressure (mmHg)IndexWaveformComment  +---------+------------------+-----+--------+--------+ Brachial 150                                     +---------+------------------+-----+--------+--------+ ATA      136               0.86 biphasic         +---------+------------------+-----+--------+--------+ PTA      138               0.87 biphasic         +---------+------------------+-----+--------+--------+ Great Toe92                0.58 Abnormal         +---------+------------------+-----+--------+--------+ +---------+------------------+-----+--------+-------+ Left     Lt Pressure (mmHg)IndexWaveformComment +---------+------------------+-----+--------+-------+ Brachial 159                                    +---------+------------------+-----+--------+-------+ ATA      152  0.96 biphasic        +---------+------------------+-----+--------+-------+ PTA      136               0.86 biphasic        +---------+------------------+-----+--------+-------+ Great Toe116               0.73 Normal          +---------+------------------+-----+--------+-------+ +-------+-----------+-----------+------------+------------+ ABI/TBIToday's ABIToday's TBIPrevious ABIPrevious TBI +-------+-----------+-----------+------------+------------+ Right  .87        .58                                 +-------+-----------+-----------+------------+------------+ Left   .96        .73                                 +-------+-----------+-----------+------------+------------+  Summary: Right: Resting right ankle-brachial index indicates  mild right lower extremity arterial disease. The right toe-brachial index is abnormal. Left: Resting left ankle-brachial index is within normal range. The left toe-brachial index is normal. *See table(s) above for measurements and observations.  Electronically signed by Leotis Pain MD on 12/01/2022 at 12:50:47 PM.    Final        Assessment & Plan:   1. Atherosclerosis of native artery of both lower extremities with intermittent claudication (HCC)  Recommend:  The patient has evidence of atherosclerosis of the lower extremities with claudication.  The patient does not voice lifestyle limiting changes at this point in time.  Noninvasive studies do not suggest clinically significant change.  No invasive studies, angiography or surgery at this time The patient should continue walking and begin a more formal exercise program.  The patient should continue antiplatelet therapy and aggressive treatment of the lipid abnormalities  No changes in the patient's medications at this time  Continued surveillance is indicated as atherosclerosis is likely to progress with time.    The patient will follow-up in 6 months for repeat noninvasive studies or sooner if issues arise.  Her  2. Benign essential hypertension Continue antihypertensive medications as already ordered, these medications have been reviewed and there are no changes at this time.  3. Hyperlipidemia, unspecified hyperlipidemia type The patient does have weakness and has been on statins for years.  There is thought that the weakness is experiencing may be related to statin.  Patient is advised to try a statin holiday for a week to see if there is any significant difference in his weakness.  If so, patient is advised to follow-up with PCP for possible adjustment of statins.   Current Outpatient Medications on File Prior to Visit  Medication Sig Dispense Refill   allopurinol (ZYLOPRIM) 100 MG tablet Take 1 tablet (100 mg total) by mouth  daily. 90 tablet 1   amLODipine-benazepril (LOTREL) 10-40 MG capsule Take 1 capsule by mouth daily. 90 capsule 3   bisoprolol (ZEBETA) 10 MG tablet Take 1 tablet (10 mg total) by mouth daily. 90 tablet 3   chlorthalidone (HYGROTON) 50 MG tablet TAKE 1 TABLET DAILY (DOSE   INCREASE) 90 tablet 0   Cholecalciferol (VITAMIN D-3) 1000 units CAPS Take 1 capsule (1,000 Units total) by mouth daily.     indomethacin (INDOCIN) 25 MG capsule At first sign of pain/gout flare take 50 mg PO TID for 2 to 3 days and then decrease to 25 mg po TID x  2 d 30 capsule 1   Omega-3 Fatty Acids (FISH OIL) 1000 MG CAPS Take 1,000 mg by mouth daily.     No current facility-administered medications on file prior to visit.    There are no Patient Instructions on file for this visit. No follow-ups on file.   Kris Hartmann, NP

## 2022-12-17 ENCOUNTER — Other Ambulatory Visit
Admission: RE | Admit: 2022-12-17 | Discharge: 2022-12-17 | Disposition: A | Discharge status: Home / Self Care | Payer: Medicare HMO | Attending: Cardiology | Admitting: Cardiology

## 2022-12-17 DIAGNOSIS — I10 Essential (primary) hypertension: Secondary | ICD-10-CM | POA: Diagnosis present

## 2022-12-17 DIAGNOSIS — E785 Hyperlipidemia, unspecified: Secondary | ICD-10-CM | POA: Diagnosis present

## 2022-12-17 LAB — BASIC METABOLIC PANEL
Anion gap: 12 (ref 5–15)
BUN: 28 mg/dL — ABNORMAL HIGH (ref 8–23)
CO2: 20 mmol/L — ABNORMAL LOW (ref 22–32)
Calcium: 9.9 mg/dL (ref 8.9–10.3)
Chloride: 109 mmol/L (ref 98–111)
Creatinine, Ser: 1.38 mg/dL — ABNORMAL HIGH (ref 0.61–1.24)
GFR, Estimated: 55 mL/min — ABNORMAL LOW (ref >60)
Glucose, Bld: 99 mg/dL (ref 70–99)
Potassium: 4.1 mmol/L (ref 3.5–5.1)
Sodium: 141 mmol/L (ref 135–145)

## 2022-12-22 DIAGNOSIS — E669 Obesity, unspecified: Secondary | ICD-10-CM | POA: Diagnosis not present

## 2022-12-22 DIAGNOSIS — I10 Essential (primary) hypertension: Secondary | ICD-10-CM | POA: Diagnosis not present

## 2022-12-22 DIAGNOSIS — G4733 Obstructive sleep apnea (adult) (pediatric): Secondary | ICD-10-CM | POA: Diagnosis not present

## 2022-12-31 DIAGNOSIS — F17201 Nicotine dependence, unspecified, in remission: Secondary | ICD-10-CM | POA: Diagnosis not present

## 2022-12-31 DIAGNOSIS — E669 Obesity, unspecified: Secondary | ICD-10-CM | POA: Diagnosis not present

## 2022-12-31 DIAGNOSIS — G4733 Obstructive sleep apnea (adult) (pediatric): Secondary | ICD-10-CM | POA: Diagnosis not present

## 2022-12-31 DIAGNOSIS — R0602 Shortness of breath: Secondary | ICD-10-CM | POA: Diagnosis not present

## 2023-01-13 ENCOUNTER — Encounter: Payer: Self-pay | Admitting: Cardiovascular Disease

## 2023-01-13 ENCOUNTER — Ambulatory Visit: Payer: Medicare HMO | Attending: Cardiovascular Disease | Admitting: Cardiovascular Disease

## 2023-01-13 VITALS — BP 144/64 | HR 77 | Ht 67.0 in | Wt 240.1 lb

## 2023-01-13 DIAGNOSIS — E785 Hyperlipidemia, unspecified: Secondary | ICD-10-CM

## 2023-01-13 DIAGNOSIS — I1A0 Resistant hypertension: Secondary | ICD-10-CM

## 2023-01-13 DIAGNOSIS — I10 Essential (primary) hypertension: Secondary | ICD-10-CM

## 2023-01-13 NOTE — Patient Instructions (Signed)
Medication Instructions:  No changes *If you need a refill on your cardiac medications before your next appointment, please call your pharmacy*   Lab Work: None ordered If you have labs (blood work) drawn today and your tests are completely normal, you will receive your results only by: MyChart Message (if you have MyChart) OR A paper copy in the mail If you have any lab test that is abnormal or we need to change your treatment, we will call you to review the results.   Testing/Procedures: None ordered   Follow-Up: At [facility], you and your health needs are our priority.  As part of our continuing mission to provide you with exceptional heart care, we have created designated Provider Care Teams.  These Care Teams include your primary Cardiologist (physician) and Advanced Practice Providers (APPs -  Physician Assistants and Nurse Practitioners) who all work together to provide you with the care you need, when you need it.  We recommend signing up for the patient portal called "MyChart".  Sign up information is provided on this After Visit Summary.  MyChart is used to connect with patients for Virtual Visits (Telemedicine).  Patients are able to view lab/test results, encounter notes, upcoming appointments, etc.  Non-urgent messages can be sent to your provider as well.   To learn more about what you can do with MyChart, go to [email or web address].    Your next appointment:   6 month(s)  Provider:   You may see Dr. Arida or one of the following Advanced Practice Providers on your designated Care Team:   Christopher Berge, NP Ryan Dunn, PA-C Cadence Furth, PA-C Sheri Hammock, NP    

## 2023-01-13 NOTE — Progress Notes (Unsigned)
Cardiology Office Note   Date:  01/15/2023   ID:  Todd Hutchinson, DOB 1950/10/12, MRN [id number]  PCP:  Todd Berry, PA-C  Cardiologist:  Dr. Azucena Hutchinson  Chief Complaint  Patient presents with   Follow-up    Discuss Renal HTN. Meds reviewed verbally with pt.      History of Present Illness: Todd Hutchinson is a 72 y.o. male who is here today for follow-up visit regarding refractory hypertension.   He has known history of hypertension, hyperlipidemia, sleep apnea on CPAP, obesity, COPD and previous tobacco use. He has difficult to control hypertension and intolerance to some antihypertensive medications.  He reports prolonged history of hypertension that became more difficult to control as he got older. Renal artery duplex in May 2023 showed mild bilateral renal artery stenosis.  Peak velocity in the right was 216 cm/s and on the left was 222 cm/s. He does have sleep apnea but uses CPAP on a regular basis.  He is back here today to discuss renal denervation.  His blood pressure is reasonably controlled at home but he continues to be on 5 antihypertensive medications.  He denies chest pain or worsening dyspnea.  Past Medical History:  Diagnosis Date   Abnormal weight gain 03/13/2016   COPD (chronic obstructive pulmonary disease) (HCC)    Hx of malignant neoplasm of prostate 05/31/2012   Hx of tobacco use, presenting hazards to health 04/17/2016   Hypercholesterolemia 03/13/2016   Hyperlipidemia    Hypertension    Hypertensive nephrosclerosis 01/27/2018   Prostate cancer (HCC)    Sleep apnea    uses CPAP    Past Surgical History:  Procedure Laterality Date   COLONOSCOPY WITH PROPOFOL N/A 07/29/2018   Procedure: COLONOSCOPY WITH BIOPSIES;  Surgeon: Midge Minium, MD;  Location: [facility];  Service: Endoscopy;  Laterality: N/A;   POLYPECTOMY N/A 07/29/2018   Procedure: POLYPECTOMY;  Surgeon: Midge Minium, MD;  Location: [facility];  Service:  Endoscopy;  Laterality: N/A;   PROSTATE SURGERY       Current Outpatient Medications  Medication Sig Dispense Refill   allopurinol (ZYLOPRIM) 100 MG tablet Take 1 tablet (100 mg total) by mouth daily. 90 tablet 1   amLODipine-benazepril (LOTREL) 10-40 MG capsule Take 1 capsule by mouth daily. 90 capsule 3   bisoprolol (ZEBETA) 10 MG tablet Take 1 tablet (10 mg total) by mouth daily. 90 tablet 3   chlorthalidone (HYGROTON) 50 MG tablet TAKE 1 TABLET DAILY (DOSE   INCREASE) 90 tablet 0   Cholecalciferol (VITAMIN D-3) 1000 units CAPS Take 1 capsule (1,000 Units total) by mouth daily.     ezetimibe (ZETIA) 10 MG tablet Take 1 tablet (10 mg total) by mouth daily. 90 tablet 3   hydrALAZINE (APRESOLINE) 50 MG tablet Take 1 tablet (50 mg total) by mouth 2 (two) times daily. 180 tablet 3   indomethacin (INDOCIN) 25 MG capsule At first sign of pain/gout flare take 50 mg PO TID for 2 to 3 days and then decrease to 25 mg po TID x 2 d 30 capsule 1   Omega-3 Fatty Acids (FISH OIL) 1000 MG CAPS Take 1,000 mg by mouth daily.     No current facility-administered medications for this visit.    Allergies:   Atorvastatin    Social History:  The patient  reports that he quit smoking about 9 years ago. His smoking use included cigarettes. He has a 40.00 pack-year smoking history. He has never used smokeless  tobacco. He reports current alcohol use of about 16.0 standard drinks of alcohol per week. He reports that he does not use drugs.   Family History:  The patient's family history includes Aneurysm (age of onset: 53) in his mother; Cancer in his brother; Heart attack in his father; Heart disease in his paternal grandfather; Heart failure in his brother; Hypertension in his brother, brother, father, mother, sister, and sister.    ROS:  Please see the history of present illness.   Otherwise, review of systems are positive for none.   All other systems are reviewed and negative.    PHYSICAL EXAM: VS:  BP (!)  144/64 (BP Location: Left Arm, Patient Position: Sitting, Cuff Size: Large)   Pulse 77   Ht 5\' 7"  (1.702 m)   Wt 240 lb 2 oz (108.9 kg)   SpO2 98%   BMI 37.61 kg/m  , BMI Body mass index is 37.61 kg/m. GEN: Well nourished, well developed, in no acute distress  HEENT: normal  Neck: no JVD, carotid bruits, or masses Cardiac: RRR; no murmurs, rubs, or gallops,no edema  Respiratory:  clear to auscultation bilaterally, normal work of breathing GI: soft, nontender, nondistended, + BS MS: no deformity or atrophy  Skin: warm and dry, no rash Neuro:  Strength and sensation are intact Psych: euthymic mood, full affect   EKG:  EKG is not ordered today.    Recent Labs: 11/04/2022: ALT 34; Hemoglobin 12.0; Platelets 244 12/17/2022: BUN 28; Creatinine, Ser 1.38; Potassium 4.1; Sodium 141    Lipid Panel    Component Value Date/Time   CHOL 167 11/04/2022 1218   CHOL 139 04/18/2015 0823   TRIG 43 11/04/2022 1218   HDL 122 11/04/2022 1218   HDL 46 04/18/2015 0823   CHOLHDL 1.4 11/04/2022 1218   VLDL 46 (H) 04/15/2017 0921   LDLCALC 33 11/04/2022 1218      Wt Readings from Last 3 Encounters:  01/13/23 240 lb 2 oz (108.9 kg)  12/05/22 234 lb 4 oz (106.3 kg)  11/26/22 234 lb 3.2 oz (106.2 kg)         03/07/2022    2:45 PM  PAD Screen  Previous PAD dx? No  Previous surgical procedure? No  Pain with walking? Yes  Subsides with rest? Yes  Feet/toe relief with dangling? No  Painful, non-healing ulcers? No  Extremities discolored? No      ASSESSMENT AND PLAN:  1.  Resistant hypertension: The patient's blood pressure is still mildly elevated and spite of 5 antihypertensive medications.  He has chronic kidney disease but his GFR is only mildly reduced at 55.  He is also less than 37 years old and thus overall he seems to be a good candidate for renal denervation.  I discussed the procedure in details as well as risks and benefits.  I provided him with educational material about the  procedure and he wants to think about this before proceeding.  At the present time, he seems to be satisfied with his medications and blood pressure control.    2.  Sleep apnea: He reports compliance with CPAP.  3.  Hyperlipidemia: Currently on pravastatin 40 mg daily.  Most recent lipid profile showed an LDL of 71.    Disposition:   Follow-up in 6 months.  Signed,  Lorine Bears, MD  01/15/2023 7:11 AM    [facility]

## 2023-01-19 DIAGNOSIS — R319 Hematuria, unspecified: Secondary | ICD-10-CM | POA: Diagnosis not present

## 2023-01-19 DIAGNOSIS — N1831 Chronic kidney disease, stage 3a: Secondary | ICD-10-CM | POA: Diagnosis not present

## 2023-01-19 DIAGNOSIS — I10 Essential (primary) hypertension: Secondary | ICD-10-CM | POA: Diagnosis not present

## 2023-01-22 DIAGNOSIS — I10 Essential (primary) hypertension: Secondary | ICD-10-CM | POA: Diagnosis not present

## 2023-01-22 DIAGNOSIS — R319 Hematuria, unspecified: Secondary | ICD-10-CM | POA: Diagnosis not present

## 2023-01-22 DIAGNOSIS — N1831 Chronic kidney disease, stage 3a: Secondary | ICD-10-CM | POA: Diagnosis not present

## 2023-02-04 DIAGNOSIS — G4733 Obstructive sleep apnea (adult) (pediatric): Secondary | ICD-10-CM | POA: Diagnosis not present

## 2023-02-11 ENCOUNTER — Ambulatory Visit: Payer: Medicare HMO | Attending: Cardiology | Admitting: Cardiology

## 2023-02-11 ENCOUNTER — Encounter: Payer: Self-pay | Admitting: Cardiology

## 2023-02-11 VITALS — BP 146/68 | HR 96 | Ht 67.0 in | Wt 239.0 lb

## 2023-02-11 DIAGNOSIS — E7800 Pure hypercholesterolemia, unspecified: Secondary | ICD-10-CM

## 2023-02-11 DIAGNOSIS — I10 Essential (primary) hypertension: Secondary | ICD-10-CM

## 2023-02-11 DIAGNOSIS — Z6836 Body mass index (BMI) 36.0-36.9, adult: Secondary | ICD-10-CM | POA: Diagnosis not present

## 2023-02-11 NOTE — Progress Notes (Signed)
Cardiology Office Note:    Date:  02/11/2023   ID:  Todd Hutchinson, DOB [birth year], MRN [id number]  PCP:  Danelle Berry, PA-C   [facility] Providers Cardiologist:  Debbe Odea, MD     Referring MD: Danelle Berry, PA-C   Chief Complaint  Patient presents with   Follow-up    Patient denies new or acute cardiac problems/concerns today.  Heart sound irregular during bp check, EKG and rhythm strip performed for MD review.      History of Present Illness:    Todd Hutchinson is a 72 y.o. male with a hx of hypertension, hyperlipidemia, PAD, COPD, former smoker x40+ years who presents for follow-up.    Being seen for resistant hypertension, evaluated by ICU, declined renal denervation.  Hydralazine started after last visit, blood pressure has improved, states not being compliant with CPAP machine before, has been more compliant of late.  Blood pressures at home range in the low 100s to 140s systolic.  Overall feels well, no concerns at this time.  Started on Zetia due to statin induced myalgias.  Prior notes  Lower extremity ultrasound 07/2022 right ABI 0.7, left ABI 0.79 Renal artery ultrasound 01/2022 mild RAS, 1 to 59% previously was on carvedilol but did not tolerate due to abdominal discomfort and diarrhea.  Myalgias with both Pravachol and atorvastatin.   Past Medical History:  Diagnosis Date   Abnormal weight gain 03/13/2016   COPD (chronic obstructive pulmonary disease) (HCC)    Hx of malignant neoplasm of prostate 05/31/2012   Hx of tobacco use, presenting hazards to health 04/17/2016   Hypercholesterolemia 03/13/2016   Hyperlipidemia    Hypertension    Hypertensive nephrosclerosis 01/27/2018   Prostate cancer (HCC)    Sleep apnea    uses CPAP    Past Surgical History:  Procedure Laterality Date   COLONOSCOPY WITH PROPOFOL N/A 07/29/2018   Procedure: COLONOSCOPY WITH BIOPSIES;  Surgeon: Midge Minium, MD;  Location: [facility];  Service:  Endoscopy;  Laterality: N/A;   POLYPECTOMY N/A 07/29/2018   Procedure: POLYPECTOMY;  Surgeon: Midge Minium, MD;  Location: [facility];  Service: Endoscopy;  Laterality: N/A;   PROSTATE SURGERY      Current Medications: Current Meds  Medication Sig   allopurinol (ZYLOPRIM) 100 MG tablet Take 1 tablet (100 mg total) by mouth daily.   amLODipine-benazepril (LOTREL) 10-40 MG capsule Take 1 capsule by mouth daily.   aspirin EC 81 MG tablet Take 81 mg by mouth daily.   bisoprolol (ZEBETA) 10 MG tablet Take 1 tablet (10 mg total) by mouth daily.   chlorthalidone (HYGROTON) 50 MG tablet TAKE 1 TABLET DAILY (DOSE   INCREASE)   Cholecalciferol (VITAMIN D-3) 1000 units CAPS Take 1 capsule (1,000 Units total) by mouth daily.   ezetimibe (ZETIA) 10 MG tablet Take 1 tablet (10 mg total) by mouth daily.   hydrALAZINE (APRESOLINE) 50 MG tablet Take 1 tablet (50 mg total) by mouth 2 (two) times daily.   indomethacin (INDOCIN) 25 MG capsule At first sign of pain/gout flare take 50 mg PO TID for 2 to 3 days and then decrease to 25 mg po TID x 2 d   Omega-3 Fatty Acids (FISH OIL) 1000 MG CAPS Take 1,000 mg by mouth daily.     Allergies:   Atorvastatin   Social History   Socioeconomic History   Marital status: Married    Spouse name: Elveria Rising    Number of children: 1   Years of  education: Not on file   Highest education level: 12th grade  Occupational History    Comment: Retired   Tobacco Use   Smoking status: Former    Packs/day: 1.00    Years: 40.00    Additional pack years: 0.00    Total pack years: 40.00    Types: Cigarettes    Quit date: 11/2013    Years since quitting: 9.2   Smokeless tobacco: Never  Vaping Use   Vaping Use: Never used  Substance and Sexual Activity   Alcohol use: Yes    Alcohol/week: 16.0 standard drinks of alcohol    Types: 16 Shots of liquor per week    Comment: no more than 4 whiskey drinks a day; maybe 4 times day    Drug use: No   Sexual activity:  Never    Partners: Female  Other Topics Concern   Not on file  Social History Narrative   Not on file   Social Determinants of Health   Financial Resource Strain: Low Risk  (07/02/2018)   Overall Financial Resource Strain (CARDIA)    Difficulty of Paying Living Expenses: Not hard at all  Food Insecurity: No Food Insecurity (07/02/2018)   Hunger Vital Sign    Worried About Running Out of Food in the Last Year: Never true    Ran Out of Food in the Last Year: Never true  Transportation Needs: No Transportation Needs (04/02/2020)   PRAPARE - Administrator, Civil Service (Medical): No    Lack of Transportation (Non-Medical): No  Physical Activity: Inactive (07/05/2019)   Exercise Vital Sign    Days of Exercise per Week: 0 days    Minutes of Exercise per Session: 0 min  Stress: No Stress Concern Present (07/02/2018)   [employer] of Occupational Health - Occupational Stress Questionnaire    Feeling of Stress : Not at all  Social Connections: Moderately Integrated (07/02/2018)   Social Connection and Isolation Panel [NHANES]    Frequency of Communication with Friends and Family: More than three times a week    Frequency of Social Gatherings with Friends and Family: Once a week    Attends Religious Services: More than 4 times per year    Active Member of [employer] or Organizations: No    Attends Engineer, structural: Never    Marital Status: Married     Family History: The patient's family history includes Aneurysm (age of onset: 29) in his mother; Cancer in his brother; Heart attack in his father; Heart disease in his paternal grandfather; Heart failure in his brother; Hypertension in his brother, brother, father, mother, sister, and sister. There is no history of Kidney cancer, Kidney failure, Prostate cancer, Sickle cell anemia, or Tuberculosis.  ROS:   Please see the history of present illness.     All other systems reviewed and are  negative.  EKGs/Labs/Other Studies Reviewed:    The following studies were reviewed today:   EKG:  EKG is ordered today.  EKG shows normal sinus rhythm  Recent Labs: 11/04/2022: ALT 34; Hemoglobin 12.0; Platelets 244 12/17/2022: BUN 28; Creatinine, Ser 1.38; Potassium 4.1; Sodium 141  Recent Lipid Panel    Component Value Date/Time   CHOL 167 11/04/2022 1218   CHOL 139 04/18/2015 0823   TRIG 43 11/04/2022 1218   HDL 122 11/04/2022 1218   HDL 46 04/18/2015 0823   CHOLHDL 1.4 11/04/2022 1218   VLDL 46 (H) 04/15/2017 0921   LDLCALC 33 11/04/2022 1218  Risk Assessment/Calculations:          Physical Exam:    VS:  BP (!) 146/68 (BP Location: Left Arm, Patient Position: Sitting, Cuff Size: Large)   Pulse 96   Ht 5\' 7"  (1.702 m)   Wt 239 lb (108.4 kg)   SpO2 (!) 82%   BMI 37.43 kg/m     Wt Readings from Last 3 Encounters:  02/11/23 239 lb (108.4 kg)  01/13/23 240 lb 2 oz (108.9 kg)  12/05/22 234 lb 4 oz (106.3 kg)     GEN:  Well nourished, well developed in no acute distress HEENT: Normal NECK: No JVD; No carotid bruits CARDIAC: RRR, no murmurs, rubs, gallops RESPIRATORY:  Clear to auscultation without rales, wheezing or rhonchi  ABDOMEN: Soft, non-tender, non-distended MUSCULOSKELETAL:  No edema; No deformity  SKIN: Warm and dry NEUROLOGIC:  Alert and oriented x 3 PSYCHIATRIC:  Normal affect   ASSESSMENT:    1. Primary hypertension   2. Pure hypercholesterolemia   3. BMI 36.0-36.9,adult    PLAN:    In order of problems listed above:  Hypertension, BP elevated but improving.  Compliance with CPAP machine advised, low-salt diet, exercise also strongly encouraged.  Continue hydralazine 50 mg twice daily, bisoprolol 10 mg daily, chlorthalidone 25, Lotrel 10-40mg  qd as prescribed.  Renal artery ultrasound showed mild nonobstructive stenosis.  Titrate hydralazine if BP stays elevated at follow-up visit.    Patient declined renal denervation  consideration. Hyperlipidemia, myalgias with Pravachol.  Continue Zetia. Obesity, low-calorie diet, exercise, weight loss advised . Follow-up in 6 months.  Medication Adjustments/Labs and Tests Ordered: Current medicines are reviewed at length with the patient today.  Concerns regarding medicines are outlined above.  Orders Placed This Encounter  Procedures   EKG 12-Lead    No orders of the defined types were placed in this encounter.    Patient Instructions  Medication Instructions:   Your physician recommends that you continue on your current medications as directed. Please refer to the Current Medication list given to you today.  *If you need a refill on your cardiac medications before your next appointment, please call your pharmacy*   Lab Work:  None Ordered  If you have labs (blood work) drawn today and your tests are completely normal, you will receive your results only by: MyChart Message (if you have MyChart) OR A paper copy in the mail If you have any lab test that is abnormal or we need to change your treatment, we will call you to review the results.   Testing/Procedures:  None Ordered    Follow-Up: At [facility], you and your health needs are our priority.  As part of our continuing mission to provide you with exceptional heart care, we have created designated Provider Care Teams.  These Care Teams include your primary Cardiologist (physician) and Advanced Practice Providers (APPs -  Physician Assistants and Nurse Practitioners) who all work together to provide you with the care you need, when you need it.  We recommend signing up for the patient portal called "MyChart".  Sign up information is provided on this After Visit Summary.  MyChart is used to connect with patients for Virtual Visits (Telemedicine).  Patients are able to view lab/test results, encounter notes, upcoming appointments, etc.  Non-urgent messages can be sent to your provider as  well.   To learn more about what you can do with MyChart, go to [email or web address].    Your next appointment:   6 month(s)  Provider:   You may see Debbe Odea, MD or one of the following Advanced Practice Providers on your designated Care Team:   Nicolasa Ducking, NP Eula Listen, PA-C Cadence Fransico Michael, PA-C Charlsie Quest, NP   Signed, Debbe Odea, MD  02/11/2023 12:22 PM    [facility]

## 2023-02-11 NOTE — Patient Instructions (Signed)
Medication Instructions:   Your physician recommends that you continue on your current medications as directed. Please refer to the Current Medication list given to you today.  *If you need a refill on your cardiac medications before your next appointment, please call your pharmacy*   Lab Work:  None Ordered  If you have labs (blood work) drawn today and your tests are completely normal, you will receive your results only by: MyChart Message (if you have MyChart) OR A paper copy in the mail If you have any lab test that is abnormal or we need to change your treatment, we will call you to review the results.   Testing/Procedures:  None Ordered   Follow-Up: At [facility], you and your health needs are our priority.  As part of our continuing mission to provide you with exceptional heart care, we have created designated Provider Care Teams.  These Care Teams include your primary Cardiologist (physician) and Advanced Practice Providers (APPs -  Physician Assistants and Nurse Practitioners) who all work together to provide you with the care you need, when you need it.  We recommend signing up for the patient portal called "MyChart".  Sign up information is provided on this After Visit Summary.  MyChart is used to connect with patients for Virtual Visits (Telemedicine).  Patients are able to view lab/test results, encounter notes, upcoming appointments, etc.  Non-urgent messages can be sent to your provider as well.   To learn more about what you can do with MyChart, go to [email or web address].    Your next appointment:   6 month(s)  Provider:   You may see Brian Agbor-Etang, MD or one of the following Advanced Practice Providers on your designated Care Team:   Christopher Berge, NP Ryan Dunn, PA-C Cadence Furth, PA-C Sheri Hammock, NP 

## 2023-03-02 ENCOUNTER — Other Ambulatory Visit: Payer: Self-pay | Admitting: Family Medicine

## 2023-03-02 DIAGNOSIS — I739 Peripheral vascular disease, unspecified: Secondary | ICD-10-CM

## 2023-03-02 DIAGNOSIS — I10 Essential (primary) hypertension: Secondary | ICD-10-CM

## 2023-03-10 ENCOUNTER — Other Ambulatory Visit: Payer: Self-pay | Admitting: Family Medicine

## 2023-03-10 DIAGNOSIS — I739 Peripheral vascular disease, unspecified: Secondary | ICD-10-CM

## 2023-03-10 DIAGNOSIS — I10 Essential (primary) hypertension: Secondary | ICD-10-CM

## 2023-03-10 NOTE — Telephone Encounter (Signed)
Medication Refill - Medication: chlorthalidone (HYGROTON) 50 MG tablet  amLODipine-benazepril (LOTREL) 10-40 MG capsule  Patient has some medication left, not currently out.  Has the patient contacted their pharmacy? Yes.   Patient received a letter from [facility] to call PCP to get prescriptions renewed.  Preferred Pharmacy (with phone number or street name): [facility] - [place], [place] - [address] [facility] AT Portal to [facility] Phone: [phone] Fax: [phone]  Has the patient been seen for an appointment in the last year OR does the patient have an upcoming appointment? Yes.    Upcoming appt on 05/05/2023.  Patients contact # : [phone]

## 2023-03-11 MED ORDER — CHLORTHALIDONE 50 MG PO TABS
ORAL_TABLET | ORAL | 0 refills | Status: DC
Start: 2023-03-11 — End: 2023-05-13

## 2023-03-11 NOTE — Telephone Encounter (Signed)
Request for Amlodipine is too soon.  Requested Prescriptions  Pending Prescriptions Disp Refills   chlorthalidone (HYGROTON) 50 MG tablet 90 tablet 0    Sig: TAKE 1 TABLET DAILY (DOSE   INCREASE)     Cardiovascular: Diuretics - Thiazide Failed - 03/10/2023  9:19 AM      Failed - Cr in normal range and within 180 days    Creat  Date Value Ref Range Status  11/04/2022 1.67 (H) 0.70 - 1.28 mg/dL Final   Creatinine, Ser  Date Value Ref Range Status  12/17/2022 1.38 (H) 0.61 - 1.24 mg/dL Final   Creatinine, Urine  Date Value Ref Range Status  07/29/2017 88 20 - 320 mg/dL Final         Failed - Last BP in normal range    BP Readings from Last 1 Encounters:  02/11/23 (!) 146/68         Passed - K in normal range and within 180 days    Potassium  Date Value Ref Range Status  12/17/2022 4.1 3.5 - 5.1 mmol/L Final         Passed - Na in normal range and within 180 days    Sodium  Date Value Ref Range Status  12/17/2022 141 135 - 145 mmol/L Final  11/14/2021 141 134 - 144 mmol/L Final         Passed - Valid encounter within last 6 months    Recent Outpatient Visits           4 months ago Benign essential hypertension   [facility] Danelle Berry, PA-C   7 months ago Benign essential hypertension   [facility] Danelle Berry, PA-C   1 year ago Benign essential hypertension   [facility] Mecum, Oswaldo Conroy, PA-C   1 year ago Anemia, unspecified type   [facility] Mecum, Oswaldo Conroy, PA-C   1 year ago Chronic gout of foot, unspecified cause, unspecified laterality   [facility] Danelle Berry, PA-C       Future Appointments             In 1 month Danelle Berry, PA-C [facility]   In 3 months Richardo Hanks, Laurette Schimke, MD [facility] [place]   In 5 months Debbe Odea, MD [facility]              amLODipine-benazepril (LOTREL) 10-40 MG capsule 90 capsule 3    Sig: Take 1 capsule by mouth daily.     Cardiovascular: CCB + ACEI Combos Failed - 03/10/2023  9:19 AM      Failed - Cr in normal range and within 180 days    Creat  Date Value Ref Range Status  11/04/2022 1.67 (H) 0.70 - 1.28 mg/dL Final   Creatinine, Ser  Date Value Ref Range Status  12/17/2022 1.38 (H) 0.61 - 1.24 mg/dL Final   Creatinine, Urine  Date Value Ref Range Status  07/29/2017 88 20 - 320 mg/dL Final         Failed - Last BP in normal range    BP Readings from Last 1 Encounters:  02/11/23 (!) 146/68         Passed - K in normal range and within 180 days    Potassium  Date Value Ref Range Status  12/17/2022 4.1 3.5 - 5.1 mmol/L Final  Passed - Na in normal range and within 180 days    Sodium  Date Value Ref Range Status  12/17/2022 141 135 - 145 mmol/L Final  11/14/2021 141 134 - 144 mmol/L Final         Passed - eGFR is 30 or above and within 180 days    GFR, Est African American  Date Value Ref Range Status  10/11/2020 72 > OR = 60 mL/min/1.44m2 Final   GFR, Est Non African American  Date Value Ref Range Status  10/11/2020 62 > OR = 60 mL/min/1.70m2 Final   GFR, Estimated  Date Value Ref Range Status  12/17/2022 55 (L) >60 mL/min Final    Comment:    (NOTE) Calculated using the CKD-EPI Creatinine Equation (2021)    eGFR  Date Value Ref Range Status  11/04/2022 44 (L) > OR = 60 mL/min/1.68m2 Final  11/14/2021 74 >59 mL/min/1.73 Final         Passed - Patient is not pregnant      Passed - Valid encounter within last 6 months    Recent Outpatient Visits           4 months ago Benign essential hypertension   [facility] Danelle Berry, PA-C   7 months ago Benign essential hypertension   [facility] Danelle Berry, PA-C   1 year ago Benign essential hypertension   [facility]  Mecum, Oswaldo Conroy, PA-C   1 year ago Anemia, unspecified type   [facility] Mecum, Oswaldo Conroy, PA-C   1 year ago Chronic gout of foot, unspecified cause, unspecified laterality   [facility] Danelle Berry, PA-C       Future Appointments             In 1 month Danelle Berry, PA-C [facility]   In 3 months Richardo Hanks, Laurette Schimke, MD [facility] [place]   In 5 months Agbor-Etang, Arlys John, MD [facility]

## 2023-04-09 ENCOUNTER — Other Ambulatory Visit: Payer: Self-pay | Admitting: Family Medicine

## 2023-04-09 DIAGNOSIS — I739 Peripheral vascular disease, unspecified: Secondary | ICD-10-CM

## 2023-04-24 ENCOUNTER — Other Ambulatory Visit: Payer: Self-pay

## 2023-04-24 MED ORDER — BISOPROLOL FUMARATE 10 MG PO TABS: 10.0000 mg | ORAL_TABLET | Freq: Every day | ORAL | 1 refills | Status: DC

## 2023-04-29 ENCOUNTER — Other Ambulatory Visit: Payer: Self-pay | Admitting: Family Medicine

## 2023-04-29 DIAGNOSIS — M1A0790 Idiopathic chronic gout, unspecified ankle and foot, without tophus (tophi): Secondary | ICD-10-CM

## 2023-05-05 ENCOUNTER — Ambulatory Visit: Payer: Medicare HMO | Admitting: Family Medicine

## 2023-05-13 ENCOUNTER — Encounter: Payer: Self-pay | Admitting: Family Medicine

## 2023-05-13 ENCOUNTER — Ambulatory Visit (INDEPENDENT_AMBULATORY_CARE_PROVIDER_SITE_OTHER): Payer: Medicare HMO | Admitting: Family Medicine

## 2023-05-13 VITALS — BP 152/68 | HR 84 | Temp 99.1°F | Resp 16 | Ht 67.0 in | Wt 235.9 lb

## 2023-05-13 DIAGNOSIS — R7303 Prediabetes: Secondary | ICD-10-CM | POA: Diagnosis not present

## 2023-05-13 DIAGNOSIS — Z6837 Body mass index (BMI) 37.0-37.9, adult: Secondary | ICD-10-CM

## 2023-05-13 DIAGNOSIS — I10 Essential (primary) hypertension: Secondary | ICD-10-CM | POA: Diagnosis not present

## 2023-05-13 DIAGNOSIS — Z5181 Encounter for therapeutic drug level monitoring: Secondary | ICD-10-CM

## 2023-05-13 DIAGNOSIS — E785 Hyperlipidemia, unspecified: Secondary | ICD-10-CM

## 2023-05-13 DIAGNOSIS — M1A0790 Idiopathic chronic gout, unspecified ankle and foot, without tophus (tophi): Secondary | ICD-10-CM

## 2023-05-13 DIAGNOSIS — M791 Myalgia, unspecified site: Secondary | ICD-10-CM

## 2023-05-13 DIAGNOSIS — I70213 Atherosclerosis of native arteries of extremities with intermittent claudication, bilateral legs: Secondary | ICD-10-CM

## 2023-05-13 DIAGNOSIS — J449 Chronic obstructive pulmonary disease, unspecified: Secondary | ICD-10-CM

## 2023-05-13 DIAGNOSIS — N1832 Chronic kidney disease, stage 3b: Secondary | ICD-10-CM

## 2023-05-13 DIAGNOSIS — R809 Proteinuria, unspecified: Secondary | ICD-10-CM

## 2023-05-13 DIAGNOSIS — T466X5A Adverse effect of antihyperlipidemic and antiarteriosclerotic drugs, initial encounter: Secondary | ICD-10-CM

## 2023-05-13 MED ORDER — CHLORTHALIDONE 50 MG PO TABS
ORAL_TABLET | ORAL | 1 refills | Status: DC
Start: 2023-05-13 — End: 2023-07-02

## 2023-05-13 MED ORDER — AMLODIPINE BESY-BENAZEPRIL HCL 10-40 MG PO CAPS: 1.0000 | ORAL_CAPSULE | Freq: Every day | ORAL | 1 refills | Status: DC

## 2023-05-13 NOTE — Assessment & Plan Note (Signed)
Sx stable

## 2023-05-13 NOTE — Assessment & Plan Note (Signed)
BMI slightly decreased again now down to 36.95

## 2023-05-13 NOTE — Assessment & Plan Note (Signed)
Recommend rechecking in about 6 months -can try and screen this annually with a complete physical, last A1c was 5.5

## 2023-05-13 NOTE — Assessment & Plan Note (Signed)
No recent gout flares, on allopurinol, last uric acid was reviewed today

## 2023-05-13 NOTE — Assessment & Plan Note (Signed)
Mild PAD on right per vascular ABI's He is going to follow with specialists q 6 month, sx improved - he suspects some of his leg/LE pain may have been from statin

## 2023-05-13 NOTE — Progress Notes (Signed)
Name: Todd Hutchinson   MRN: [id number]    DOB: 11-Sep-1951   Date:05/13/2023       Progress Note  Chief Complaint  Patient presents with   Follow-up   Hypertension   Hyperlipidemia     Subjective:   Todd Hutchinson is a 72 y.o. male, presents to clinic for routine f/up  Anemia: last labs showed stable/improving anemia - may be due to CKD (?) Hemoglobin  Date Value Ref Range Status  11/04/2022 12.0 (L) 13.2 - 17.1 g/dL Final  78/29/5621 30.8 (L) 13.2 - 17.1 g/dL Final  65/78/4696 29.5 (L) 13.2 - 17.1 g/dL Final  28/41/3244 01.0 (L) 13.2 - 17.1 g/dL Final  27/25/3664 40.3 (L) 12.6 - 17.7 g/dL Final   HGB  Date Value Ref Range Status  11/03/2013 12.2 (L) 13.0 - 18.0 g/dL Final   CKD seeing nephrology- OV in May reviewed note and results Patient's home blood pressure readings are acceptable. Current regimen of amlodipine, benazepril, bisoprolol, and hydralazine. If further control is required, thre is room to go up on hydralazine dose and add another agent.  Positive UACR 180   No recent gout flares, on allopurinol Lab Results  Component Value Date   LABURIC 7.0 08/01/2022   Hx of prediabetes: Last lab was in normal range Lab Results  Component Value Date   HGBA1C 5.5 08/01/2022   Hypertension:  Currently managed on amlodipine, benazepril, bisoprolol, and hydralazine Pt reports good med compliance and denies any SE.   Blood pressure today is elevated controlled. BP Readings from Last 3 Encounters:  05/13/23 (!) 152/68  02/11/23 (!) 146/68  01/13/23 (!) 144/64       Current Outpatient Medications:    allopurinol (ZYLOPRIM) 100 MG tablet, TAKE 1 TABLET BY MOUTH EVERY DAY, Disp: 90 tablet, Rfl: 1   amLODipine-benazepril (LOTREL) 10-40 MG capsule, TAKE 1 CAPSULE DAILY, Disp: 90 capsule, Rfl: 0   aspirin EC 81 MG tablet, Take 81 mg by mouth daily., Disp: , Rfl:    bisoprolol (ZEBETA) 10 MG tablet, Take 1 tablet (10 mg total) by mouth daily., Disp:  90 tablet, Rfl: 1   chlorthalidone (HYGROTON) 50 MG tablet, TAKE 1 TABLET DAILY (DOSE   INCREASE), Disp: 90 tablet, Rfl: 0   Cholecalciferol (VITAMIN D-3) 1000 units CAPS, Take 1 capsule (1,000 Units total) by mouth daily., Disp: , Rfl:    ezetimibe (ZETIA) 10 MG tablet, Take 1 tablet (10 mg total) by mouth daily., Disp: 90 tablet, Rfl: 3   hydrALAZINE (APRESOLINE) 50 MG tablet, Take 1 tablet (50 mg total) by mouth 2 (two) times daily., Disp: 180 tablet, Rfl: 3   indomethacin (INDOCIN) 25 MG capsule, At first sign of pain/gout flare take 50 mg PO TID for 2 to 3 days and then decrease to 25 mg po TID x 2 d, Disp: 30 capsule, Rfl: 1   Omega-3 Fatty Acids (FISH OIL) 1000 MG CAPS, Take 1,000 mg by mouth daily., Disp: , Rfl:   Patient Active Problem List   Diagnosis Date Noted   Atherosclerosis of native artery of both lower extremities with intermittent claudication (HCC) 11/04/2022   Abnormal ankle brachial index (ABI) 08/04/2022   Chronic gout of foot 08/01/2022   Anemia 10/11/2020   Irregular heart rhythm 10/11/2020   Class 2 severe obesity with serious comorbidity and body mass index (BMI) of 37.0 to 37.9 in adult [facility]) 11/02/2018   History of colonic polyps    Benign neoplasm of ascending colon  Benign neoplasm of cecum    Hypertensive nephrosclerosis 01/27/2018   Prediabetes 06/30/2017   COPD (chronic obstructive pulmonary disease) (HCC) 04/15/2017   Hx of tobacco use, presenting hazards to health 04/17/2016   Hyperlipidemia 03/13/2016   Benign essential hypertension 07/10/2015   Stage 3 chronic kidney disease (HCC) 07/10/2015   Cystitis, radiation 05/31/2012   Hematuria, microscopic 05/31/2012   ED (erectile dysfunction) of organic origin 05/31/2012   Hx of malignant neoplasm of prostate 05/31/2012    Past Surgical History:  Procedure Laterality Date   COLONOSCOPY WITH PROPOFOL N/A 07/29/2018   Procedure: COLONOSCOPY WITH BIOPSIES;  Surgeon: Midge Minium, MD;  Location: [facility];  Service: Endoscopy;  Laterality: N/A;   POLYPECTOMY N/A 07/29/2018   Procedure: POLYPECTOMY;  Surgeon: Midge Minium, MD;  Location: [facility];  Service: Endoscopy;  Laterality: N/A;   PROSTATE SURGERY      Family History  Problem Relation Age of Onset   Hypertension Mother    Aneurysm Mother 68       brain   Hypertension Father    Heart attack Father    Hypertension Sister    Hypertension Brother    Heart failure Brother    Heart disease Paternal Grandfather    Hypertension Sister    Hypertension Brother    Cancer Brother        throat cancer   Kidney cancer Neg Hx    Kidney failure Neg Hx    Prostate cancer Neg Hx    Sickle cell anemia Neg Hx    Tuberculosis Neg Hx     Social History   Tobacco Use   Smoking status: Former    Current packs/day: 0.00    Average packs/day: 1 pack/day for 40.0 years (40.0 ttl pk-yrs)    Types: Cigarettes    Start date: 11/1973    Quit date: 11/2013    Years since quitting: 9.5   Smokeless tobacco: Never  Vaping Use   Vaping status: Never Used  Substance Use Topics   Alcohol use: Yes    Alcohol/week: 16.0 standard drinks of alcohol    Types: 16 Shots of liquor per week    Comment: no more than 4 whiskey drinks a day; maybe 4 times day    Drug use: No     Allergies  Allergen Reactions   Atorvastatin Rash    Health Maintenance  Topic Date Due   Medicare Annual Wellness (AWV)  07/04/2020   INFLUENZA VACCINE  04/16/2023   Colonoscopy  07/30/2023   COVID-19 Vaccine (3 - Moderna risk series) 05/29/2023 (Originally 01/07/2020)   Zoster Vaccines- Shingrix (1 of 2) 08/13/2023 (Originally 12/08/1970)   Lung Cancer Screening  11/05/2023 (Originally 12/07/2001)   Pneumonia Vaccine 72+ Years old  Completed   Hepatitis C Screening  Completed   HPV VACCINES  Aged Out   DTaP/Tdap/Td  Discontinued    Chart Review Today: I have reviewed the patient's medical history in detail and updated the computerized patient  record.   Review of Systems  Constitutional: Negative.   HENT: Negative.    Eyes: Negative.   Respiratory: Negative.    Cardiovascular: Negative.   Gastrointestinal: Negative.   Endocrine: Negative.   Genitourinary: Negative.   Musculoskeletal: Negative.   Skin: Negative.   Allergic/Immunologic: Negative.   Neurological: Negative.   Hematological: Negative.   Psychiatric/Behavioral: Negative.    All other systems reviewed and are negative.    Objective:   Vitals:   05/13/23 1424 05/13/23 1504  BP: (!) 160/72 (!) 152/68  Pulse: 84   Resp: 16   Temp: 99.1 F (37.3 C)   TempSrc: Oral   SpO2: 93%   Weight: 235 lb 14.4 oz (107 kg)   Height: 5\' 7"  (1.702 m)     Body mass index is 36.95 kg/m.  Physical Exam Vitals and nursing note reviewed.  Constitutional:      General: He is not in acute distress.    Appearance: Normal appearance. He is well-developed. He is obese. He is not ill-appearing, toxic-appearing or diaphoretic.  HENT:     Head: Normocephalic and atraumatic.     Nose: Nose normal.  Eyes:     General: No scleral icterus.       Right eye: No discharge.        Left eye: No discharge.     Conjunctiva/sclera: Conjunctivae normal.  Neck:     Trachea: No tracheal deviation.  Cardiovascular:     Rate and Rhythm: Normal rate and regular rhythm.     Pulses: Normal pulses.     Heart sounds: Normal heart sounds.  Pulmonary:     Effort: Pulmonary effort is normal. No respiratory distress.     Breath sounds: Normal breath sounds. No stridor.  Skin:    General: Skin is warm and dry.     Findings: No rash.  Neurological:     Mental Status: He is alert. Mental status is at baseline.     Motor: No abnormal muscle tone.     Coordination: Coordination normal.     Gait: Gait normal.  Psychiatric:        Mood and Affect: Mood normal.        Behavior: Behavior normal.         Assessment & Plan:   Problem List Items Addressed This Visit        Cardiovascular and Mediastinum   Benign essential hypertension - Primary    BP not at goal today, bp elevated Home readings 120-130's and several days with SBP 140-160 Per nephro note - adjust hydralazine to keep bp controlled Pt wishes to monitor home BP and do nurse visit for BP recheck with his cuff to ensure its accurate If BP average at home and in office is still >140, will increase hydralazine He is currently taking 50 mg twice a day I did explain to him that we could increase the dose to TID or 75 mg BID if needed BP Readings from Last 3 Encounters:  05/13/23 (!) 152/68  02/11/23 (!) 146/68  01/13/23 (!) 144/64         Relevant Medications   amLODipine-benazepril (LOTREL) 10-40 MG capsule   chlorthalidone (HYGROTON) 50 MG tablet   Atherosclerosis of native artery of both lower extremities with intermittent claudication (HCC)    Mild PAD on right per vascular ABI's He is going to follow with specialists q 6 month, sx improved - he suspects some of his leg/LE pain may have been from statin      Relevant Medications   amLODipine-benazepril (LOTREL) 10-40 MG capsule   chlorthalidone (HYGROTON) 50 MG tablet     Respiratory   COPD (chronic obstructive pulmonary disease) (HCC)    Sx stable        Musculoskeletal and Integument   Chronic gout of foot    No recent gout flares, on allopurinol, last uric acid was reviewed today        Genitourinary   Stage 3 chronic kidney disease (HCC)  Patient is managing now with nephrology Reviewed his recent labs and nephrology note as well as the follow-up plan We will discontinue indomethacin as a gout flare treatment option would prefer him come in for evaluation and get steroids if needed Get blood pressure better controlled, and avoid nephrotoxic agents and NSAIDs      Relevant Orders   Microalbumin / creatinine urine ratio     Other   Prediabetes    Recommend rechecking in about 6 months -can try and screen this annually  with a complete physical, last A1c was 5.5      Class 2 severe obesity with serious comorbidity and body mass index (BMI) of 37.0 to 37.9 in adult (HCC)    BMI slightly decreased again now down to 36.95      Myalgia due to statin    Intolerance to pravastatin and atorvastatin, per cardiology pt changed to zetia Lab Results  Component Value Date   CHOL 167 11/04/2022   HDL 122 11/04/2022   LDLCALC 33 11/04/2022   TRIG 43 11/04/2022   CHOLHDL 1.4 11/04/2022         Other Visit Diagnoses     Urine test positive for microalbuminuria       recheck UACR today if able to do urine sample - he will do f/up with nephrology, consider adding farxiga?   Relevant Orders   Microalbumin / creatinine urine ratio       F/up nurse visit for uncontrolled BP/bring cuff and home readings - plan to increase hydralazine if needed for BP control  Return in about 6 months (around 11/13/2023) for CPE.   Danelle Berry, PA-C 05/13/23 2:40 PM

## 2023-05-13 NOTE — Assessment & Plan Note (Signed)
Patient is managing now with nephrology Reviewed his recent labs and nephrology note as well as the follow-up plan We will discontinue indomethacin as a gout flare treatment option would prefer him come in for evaluation and get steroids if needed Get blood pressure better controlled, and avoid nephrotoxic agents and NSAIDs

## 2023-05-13 NOTE — Assessment & Plan Note (Signed)
Intolerance to pravastatin and atorvastatin, per cardiology pt changed to zetia Lab Results  Component Value Date   CHOL 167 11/04/2022   HDL 122 11/04/2022   LDLCALC 33 11/04/2022   TRIG 43 11/04/2022   CHOLHDL 1.4 11/04/2022

## 2023-05-13 NOTE — Assessment & Plan Note (Signed)
BP not at goal today, bp elevated Home readings 120-130's and several days with SBP 140-160 Per nephro note - adjust hydralazine to keep bp controlled Pt wishes to monitor home BP and do nurse visit for BP recheck with his cuff to ensure its accurate If BP average at home and in office is still >140, will increase hydralazine He is currently taking 50 mg twice a day I did explain to him that we could increase the dose to TID or 75 mg BID if needed BP Readings from Last 3 Encounters:  05/13/23 (!) 152/68  02/11/23 (!) 146/68  01/13/23 (!) 144/64

## 2023-05-21 ENCOUNTER — Other Ambulatory Visit: Payer: Self-pay | Admitting: Family Medicine

## 2023-05-21 DIAGNOSIS — I10 Essential (primary) hypertension: Secondary | ICD-10-CM

## 2023-05-27 ENCOUNTER — Other Ambulatory Visit (INDEPENDENT_AMBULATORY_CARE_PROVIDER_SITE_OTHER): Payer: Self-pay | Admitting: Nurse Practitioner

## 2023-05-27 DIAGNOSIS — I70213 Atherosclerosis of native arteries of extremities with intermittent claudication, bilateral legs: Secondary | ICD-10-CM

## 2023-05-29 ENCOUNTER — Other Ambulatory Visit: Payer: Self-pay | Admitting: Family Medicine

## 2023-05-29 ENCOUNTER — Other Ambulatory Visit: Payer: Medicare HMO

## 2023-05-29 DIAGNOSIS — I10 Essential (primary) hypertension: Secondary | ICD-10-CM

## 2023-05-29 DIAGNOSIS — C61 Malignant neoplasm of prostate: Secondary | ICD-10-CM

## 2023-05-29 NOTE — Telephone Encounter (Signed)
Medication Refill - Medication:  amLODipine-benazepril (LOTREL) 10-40 MG capsule   Has the patient contacted their pharmacy? YesPaulino Rily with the pharmacy is calling request refill for pt.   Preferred Pharmacy (with phone number or street name): [facility] - [place], [place] - [address] [facility] AT Portal to [facility]  Phone: [phone] Fax: [phone]  Has the patient been seen for an appointment in the last year OR does the patient have an upcoming appointment? Yes.    Agent: Please be advised that RX refills may take up to 3 business days. We ask that you follow-up with your pharmacy.

## 2023-05-29 NOTE — Telephone Encounter (Signed)
Medication Refill - Medication: Cholecalciferol (VITAMIN D-3) 1000 units CAPS [[id number]]   Has the patient contacted their pharmacy? Yes.   (Agent: If no, request that the patient contact the pharmacy for the refill. If patient does not wish to contact the pharmacy document the reason why and proceed with request.) (Agent: If yes, when and what did the pharmacy advise?)  Preferred Pharmacy (with phone number or street name): [facility] - [place], [place] - [address] [facility] AT Portal to [facility]istered [facility] Sites Phone: [phone]  Fax: [phone]   Has the patient been seen for an appointment in the last year OR does the patient have an upcoming appointment? Yes.    Agent: Please be advised that RX refills may take up to 3 business days. We ask that you follow-up with your pharmacy.

## 2023-05-30 LAB — PSA: Prostate Specific Ag, Serum: 0.3 ng/mL (ref 0.0–4.0)

## 2023-06-01 MED ORDER — AMLODIPINE BESY-BENAZEPRIL HCL 10-40 MG PO CAPS
1.0000 | ORAL_CAPSULE | Freq: Every day | ORAL | 1 refills | Status: DC
Start: 2023-06-01 — End: 2024-05-06

## 2023-06-01 MED ORDER — VITAMIN D-3 25 MCG (1000 UT) PO CAPS: 1.0000 | ORAL_CAPSULE | Freq: Every day | ORAL | 1 refills | Status: AC

## 2023-06-01 NOTE — Telephone Encounter (Signed)
Requested medications are due for refill today.  unsure  Requested medications are on the active medications list.  yes  Last refill. 04/15/2017  Future visit scheduled.   no  Notes to clinic.  Medication is historical. Labs are expired.    Requested Prescriptions  Pending Prescriptions Disp Refills   Cholecalciferol (VITAMIN D-3) 25 MCG (1000 UT) CAPS 60 capsule     Sig: Take 1 capsule (1,000 Units total) by mouth daily.     Endocrinology:  Vitamins - Vitamin D Supplementation 2 Failed - 05/29/2023 11:39 AM      Failed - Manual Review: Route requests for 50,000 IU strength to the provider      Failed - Vitamin D in normal range and within 360 days    Vit D, 25-Hydroxy  Date Value Ref Range Status  04/26/2021 29 (L) 30 - 100 ng/mL Final    Comment:    Vitamin D Status         25-OH Vitamin D: . Deficiency:                    <20 ng/mL Insufficiency:             20 - 29 ng/mL Optimal:                 > or = 30 ng/mL . For 25-OH Vitamin D testing on patients on  D2-supplementation and patients for whom quantitation  of D2 and D3 fractions is required, the Quest[employer]ureD(TM) 25-OH VIT D, (D2,D3), LC/MS/MS is recommended: order  code 29562 (patients >13yrs). See Note 1 . Note 1 . For additional information, please refer to  http://education.Quest[facility].com/faq/FAQ199  (This link is being provided for informational/ educational purposes only.)          Passed - Ca in normal range and within 360 days    Calcium  Date Value Ref Range Status  12/17/2022 9.9 8.9 - 10.3 mg/dL Final         Passed - Valid encounter within last 12 months    Recent Outpatient Visits           2 weeks ago Benign essential hypertension   [facility] Danelle Berry, PA-C   6 months ago Benign essential hypertension   [facility] Danelle Berry, PA-C   10 months ago Benign essential hypertension   [facility]  Danelle Berry, PA-C   1 year ago Benign essential hypertension   [facility] Mecum, Oswaldo Conroy, PA-C   1 year ago Anemia, unspecified type   [facility] Mecum, Oswaldo Conroy, PA-C       Future Appointments             In 1 week Richardo Hanks, Laurette Schimke, MD [facility] [place]   In 2 months Agbor-Etang, Arlys John, MD [facility] [place]   In 5 months Danelle Berry, PA-C [facility]

## 2023-06-01 NOTE — Telephone Encounter (Signed)
Call to patinet- verifies [facility] Rx forwarded. Requested Prescriptions  Pending Prescriptions Disp Refills   amLODipine-benazepril (LOTREL) 10-40 MG capsule 90 capsule 1    Sig: Take 1 capsule by mouth daily.     Cardiovascular: CCB + ACEI Combos Failed - 05/29/2023  1:53 PM      Failed - Cr in normal range and within 180 days    Creat  Date Value Ref Range Status  11/04/2022 1.67 (H) 0.70 - 1.28 mg/dL Final   Creatinine, Ser  Date Value Ref Range Status  12/17/2022 1.38 (H) 0.61 - 1.24 mg/dL Final   Creatinine, Urine  Date Value Ref Range Status  07/29/2017 88 20 - 320 mg/dL Final         Failed - Last BP in normal range    BP Readings from Last 1 Encounters:  05/13/23 (!) 152/68         Passed - K in normal range and within 180 days    Potassium  Date Value Ref Range Status  12/17/2022 4.1 3.5 - 5.1 mmol/L Final         Passed - Na in normal range and within 180 days    Sodium  Date Value Ref Range Status  12/17/2022 141 135 - 145 mmol/L Final  11/14/2021 141 134 - 144 mmol/L Final         Passed - eGFR is 30 or above and within 180 days    GFR, Est African American  Date Value Ref Range Status  10/11/2020 72 > OR = 60 mL/min/1.54m2 Final   GFR, Est Non African American  Date Value Ref Range Status  10/11/2020 62 > OR = 60 mL/min/1.26m2 Final   GFR, Estimated  Date Value Ref Range Status  12/17/2022 55 (L) >60 mL/min Final    Comment:    (NOTE) Calculated using the CKD-EPI Creatinine Equation (2021)    eGFR  Date Value Ref Range Status  11/04/2022 44 (L) > OR = 60 mL/min/1.56m2 Final  11/14/2021 74 >59 mL/min/1.73 Final         Passed - Patient is not pregnant      Passed - Valid encounter within last 6 months    Recent Outpatient Visits           2 weeks ago Benign essential hypertension   [facility] Danelle Berry, PA-C   6 months ago Benign essential hypertension   [facility] Danelle Berry, PA-C   10 months ago Benign essential hypertension   [facility] Danelle Berry, PA-C   1 year ago Benign essential hypertension   [facility] Mecum, Oswaldo Conroy, PA-C   1 year ago Anemia, unspecified type   [facility] Mecum, Oswaldo Conroy, PA-C       Future Appointments             In 1 week Richardo Hanks, Laurette Schimke, MD [facility] [place]   In 2 months Agbor-Etang, Arlys John, MD [facility] [place]   In 5 months Danelle Berry, PA-C [facility]

## 2023-06-02 ENCOUNTER — Ambulatory Visit (INDEPENDENT_AMBULATORY_CARE_PROVIDER_SITE_OTHER): Payer: Medicare HMO | Admitting: Vascular Surgery

## 2023-06-02 ENCOUNTER — Ambulatory Visit (INDEPENDENT_AMBULATORY_CARE_PROVIDER_SITE_OTHER): Payer: Medicare HMO

## 2023-06-02 VITALS — BP 143/84 | HR 57 | Resp 18 | Ht 67.0 in | Wt 237.0 lb

## 2023-06-02 DIAGNOSIS — I70213 Atherosclerosis of native arteries of extremities with intermittent claudication, bilateral legs: Secondary | ICD-10-CM | POA: Diagnosis not present

## 2023-06-02 DIAGNOSIS — I10 Essential (primary) hypertension: Secondary | ICD-10-CM

## 2023-06-02 DIAGNOSIS — E785 Hyperlipidemia, unspecified: Secondary | ICD-10-CM | POA: Diagnosis not present

## 2023-06-02 DIAGNOSIS — N183 Chronic kidney disease, stage 3 unspecified: Secondary | ICD-10-CM | POA: Diagnosis not present

## 2023-06-02 NOTE — Assessment & Plan Note (Signed)
ABIs today are stable at 0.87 on the right and 0.90 on the left with multiphasic waveforms and digit pressures of 99 on the right and 112 on the left.  He is doing well.  He will continue his aspirin therapy and lifestyle modifications.  No role for intervention at this time.  Follow-up in 1 year with noninvasive studies.

## 2023-06-02 NOTE — Assessment & Plan Note (Addendum)
lipid control important in reducing the progression of atherosclerotic disease.

## 2023-06-02 NOTE — Assessment & Plan Note (Signed)
blood pressure control important in reducing the progression of atherosclerotic disease. On appropriate oral medications.  

## 2023-06-02 NOTE — Progress Notes (Signed)
MRN : [id number]  Todd Hutchinson is a 72 y.o. (09-25-1950) male who presents with chief complaint of  Chief Complaint  Patient presents with   Venous Insufficiency  .  History of Present Illness: Patient returns today in follow up of his peripheral arterial disease.  He says his legs are doing better.  He says they are not perfect, but he feels like he is walking better with less discomfort.  No new ulceration or infection.  No rest pain.  ABIs today are stable at 0.87 on the right and 0.90 on the left with multiphasic waveforms and digit pressures of 99 on the right and 112 on the left.  Current Outpatient Medications  Medication Sig Dispense Refill   allopurinol (ZYLOPRIM) 100 MG tablet TAKE 1 TABLET BY MOUTH EVERY DAY 90 tablet 1   amLODipine-benazepril (LOTREL) 10-40 MG capsule Take 1 capsule by mouth daily. 90 capsule 1   aspirin EC 81 MG tablet Take 81 mg by mouth daily.     bisoprolol (ZEBETA) 10 MG tablet Take 1 tablet (10 mg total) by mouth daily. 90 tablet 1   chlorthalidone (HYGROTON) 50 MG tablet TAKE 1 TABLET DAILY (DOSE   INCREASE) 90 tablet 1   Cholecalciferol (VITAMIN D-3) 25 MCG (1000 UT) CAPS Take 1 capsule (1,000 Units total) by mouth daily. 90 capsule 1   ezetimibe (ZETIA) 10 MG tablet Take 1 tablet (10 mg total) by mouth daily. 90 tablet 3   hydrALAZINE (APRESOLINE) 50 MG tablet Take 1 tablet (50 mg total) by mouth 2 (two) times daily. 180 tablet 3   Omega-3 Fatty Acids (FISH OIL) 1000 MG CAPS Take 1,000 mg by mouth daily.     No current facility-administered medications for this visit.    Past Medical History:  Diagnosis Date   Abnormal weight gain 03/13/2016   COPD (chronic obstructive pulmonary disease) (HCC)    Hx of malignant neoplasm of prostate 05/31/2012   Hx of tobacco use, presenting hazards to health 04/17/2016   Hypercholesterolemia 03/13/2016   Hyperlipidemia    Hypertension    Hypertensive nephrosclerosis 01/27/2018   Prostate cancer (HCC)     Sleep apnea    uses CPAP    Past Surgical History:  Procedure Laterality Date   COLONOSCOPY WITH PROPOFOL N/A 07/29/2018   Procedure: COLONOSCOPY WITH BIOPSIES;  Surgeon: Midge Minium, MD;  Location: [facility];  Service: Endoscopy;  Laterality: N/A;   POLYPECTOMY N/A 07/29/2018   Procedure: POLYPECTOMY;  Surgeon: Midge Minium, MD;  Location: [facility];  Service: Endoscopy;  Laterality: N/A;   PROSTATE SURGERY       Social History   Tobacco Use   Smoking status: Former    Current packs/day: 0.00    Average packs/day: 1 pack/day for 40.0 years (40.0 ttl pk-yrs)    Types: Cigarettes    Start date: 11/1973    Quit date: 11/2013    Years since quitting: 9.5   Smokeless tobacco: Never  Vaping Use   Vaping status: Never Used  Substance Use Topics   Alcohol use: Yes    Alcohol/week: 16.0 standard drinks of alcohol    Types: 16 Shots of liquor per week    Comment: no more than 4 whiskey drinks a day; maybe 4 times day    Drug use: No       Family History  Problem Relation Age of Onset   Hypertension Mother    Aneurysm Mother 62  brain   Hypertension Father    Heart attack Father    Hypertension Sister    Hypertension Brother    Heart failure Brother    Heart disease Paternal Grandfather    Hypertension Sister    Hypertension Brother    Cancer Brother        throat cancer   Kidney cancer Neg Hx    Kidney failure Neg Hx    Prostate cancer Neg Hx    Sickle cell anemia Neg Hx    Tuberculosis Neg Hx      Allergies  Allergen Reactions   Atorvastatin Rash     REVIEW OF SYSTEMS (Negative unless checked)  Constitutional: [] Weight loss  [] Fever  [] Chills Cardiac: [] Chest pain   [] Chest pressure   [] Palpitations   [] Shortness of breath when laying flat   [] Shortness of breath at rest   [] Shortness of breath with exertion. Vascular:  [x] Pain in legs with walking   [] Pain in legs at rest   [] Pain in legs when laying flat   [] Claudication    [] Pain in feet when walking  [] Pain in feet at rest  [] Pain in feet when laying flat   [] History of DVT   [] Phlebitis   [x] Swelling in legs   [] Varicose veins   [] Non-healing ulcers Pulmonary:   [] Uses home oxygen   [] Productive cough   [] Hemoptysis   [] Wheeze  [x] COPD   [] Asthma Neurologic:  [] Dizziness  [] Blackouts   [] Seizures   [] History of stroke   [] History of TIA  [] Aphasia   [] Temporary blindness   [] Dysphagia   [] Weakness or numbness in arms   [] Weakness or numbness in legs Musculoskeletal:  [x] Arthritis   [] Joint swelling   [x] Joint pain   [] Low back pain Hematologic:  [] Easy bruising  [] Easy bleeding   [] Hypercoagulable state   [] Anemic   Gastrointestinal:  [] Blood in stool   [] Vomiting blood  [] Gastroesophageal reflux/heartburn   [] Abdominal pain Genitourinary:  [] Chronic kidney disease   [] Difficult urination  [] Frequent urination  [] Burning with urination   [] Hematuria Skin:  [] Rashes   [] Ulcers   [] Wounds Psychological:  [] History of anxiety   []  History of major depression.  Physical Examination  BP (!) 143/84 (BP Location: Left Arm)   Pulse (!) 57   Resp 18   Ht 5\' 7"  (1.702 m)   Wt 237 lb (107.5 kg)   BMI 37.12 kg/m  Gen:  WD/WN, NAD Head: /AT, No temporalis wasting. Ear/Nose/Throat: Hearing grossly intact, nares w/o erythema or drainage Eyes: Conjunctiva clear. Sclera non-icteric Neck: Supple.  Trachea midline Pulmonary:  Good air movement, no use of accessory muscles.  Cardiac: RRR, no JVD Vascular:  Vessel Right Left  Radial Palpable Palpable                          PT Palpable Palpable  DP Palpable Palpable   Gastrointestinal: soft, non-tender/non-distended. No guarding/reflex.  Musculoskeletal: M/S 5/5 throughout.  No deformity or atrophy. Mild BLE edema. Neurologic: Sensation grossly intact in extremities.  Symmetrical.  Speech is fluent.  Psychiatric: Judgment intact, Mood & affect appropriate for pt's clinical situation. Dermatologic: No rashes  or ulcers noted.  No cellulitis or open wounds.      Labs Recent Results (from the past 2160 hour(s))  PSA     Status: None   Collection Time: 05/29/23  1:21 PM  Result Value Ref Range   Prostate Specific Ag, Serum 0.3 0.0 - 4.0 ng/mL  Comment: Roche ECLIA methodology. According to the American Urological Association, Serum PSA should decrease and remain at undetectable levels after radical prostatectomy. The AUA defines biochemical recurrence as an initial PSA value 0.2 ng/mL or greater followed by a subsequent confirmatory PSA value 0.2 ng/mL or greater. Values obtained with different assay methods or kits cannot be used interchangeably. Results cannot be interpreted as absolute evidence of the presence or absence of malignant disease.     Radiology No results found.  Assessment/Plan  Benign essential hypertension blood pressure control important in reducing the progression of atherosclerotic disease. On appropriate oral medications.   Stage 3 chronic kidney disease (HCC) Hydrate and limit contrast if the patient needs any contrast requiring procedures for revascularization.  Hyperlipidemia lipid control important in reducing the progression of atherosclerotic disease.    Atherosclerosis of native artery of both lower extremities with intermittent claudication (HCC) ABIs today are stable at 0.87 on the right and 0.90 on the left with multiphasic waveforms and digit pressures of 99 on the right and 112 on the left.  He is doing well.  He will continue his aspirin therapy and lifestyle modifications.  No role for intervention at this time.  Follow-up in 1 year with noninvasive studies.    Festus Barren, MD  06/02/2023 2:57 PM    This note was created with Dragon medical transcription system.  Any errors from dictation are purely unintentional

## 2023-06-02 NOTE — Assessment & Plan Note (Signed)
Hydrate and limit contrast if the patient needs any contrast requiring procedures for revascularization.

## 2023-06-03 ENCOUNTER — Ambulatory Visit: Payer: Medicare HMO | Admitting: Urology

## 2023-06-08 LAB — VAS US ABI WITH/WO TBI
Left ABI: 0.9
Right ABI: 0.87

## 2023-06-10 ENCOUNTER — Ambulatory Visit: Payer: Medicare HMO | Admitting: Urology

## 2023-06-10 ENCOUNTER — Encounter: Payer: Self-pay | Admitting: Urology

## 2023-06-10 VITALS — BP 171/67 | HR 97 | Ht 67.0 in | Wt 237.0 lb

## 2023-06-10 DIAGNOSIS — Z8546 Personal history of malignant neoplasm of prostate: Secondary | ICD-10-CM

## 2023-06-10 DIAGNOSIS — N393 Stress incontinence (female) (male): Secondary | ICD-10-CM

## 2023-06-10 DIAGNOSIS — C61 Malignant neoplasm of prostate: Secondary | ICD-10-CM

## 2023-06-10 DIAGNOSIS — Z08 Encounter for follow-up examination after completed treatment for malignant neoplasm: Secondary | ICD-10-CM | POA: Diagnosis not present

## 2023-06-10 NOTE — Progress Notes (Signed)
06/10/2023 12:33 PM   Todd Hutchinson [facility] Oct 15, 1950 [id number]  Reason for visit: Follow up prostate cancer, incontinence  HPI: [name] is a 71 year old African-American male previously followed by Dr. Achilles Dunk with history of open radical prostatectomy in 2000 with final pathology showing Gleason score 3+4=7 with negative margins and no extraprostatic extension, salvage radiation in 2006 for reported biochemical recurrence, and had been on long-term Lupron androgen deprivation therapy.  His PSA trend since 2014 had remained essentially undetectable on Lupron from 0 - 0.2.   At our visit in January 2020 his PSA remained stable at 0.2 from 0.2 after being off of Lupron for 9 months when he transitioned from Dr. Achilles Dunk to our practice at [facility].  We discussed the risks and benefits of stopping Lupron and continuing to monitor the PSA, and he agreed to stop Lupron.  His repeat PSA on 07/12/2019 was stable at 0.2 from 0.2 previously.  PSA has remained very low off of ADT with PSA values of 0.15 Jan 2020, 0.17 Jan 2021, 0.20 April 2021, 0.19 October 2021, 0.15 April 2022, and 0.3 in September 2024.  Using shared decision making he would like to remain off Lupron.  We discussed the importance of continuing to monitor the PSA.   He reports some mild increase in stress incontinence over the last year, seems to be worse when mowing or doing strenuous activity.  He thinks it may have actually improved over the last week or 2.  I recommended starting with Kegel exercises, could could referral to Dr. Lafonda Mosses if worsening incontinence in the future   RTC 1 year PSA prior  Sondra Come, MD  [facility] [address] [place], [place] [address] [phone]

## 2023-06-10 NOTE — Patient Instructions (Signed)
Kegel Exercises  Kegel exercises can help strengthen your pelvic floor muscles. The pelvic floor is a group of muscles that support your rectum, small intestine, and bladder. In females, pelvic floor muscles also help support the uterus. These muscles help you control the flow of urine and stool (feces). Kegel exercises are painless and simple. They do not require any equipment. Your provider may suggest Kegel exercises to: Improve bladder and bowel control. Improve sexual response. Improve weak pelvic floor muscles after surgery to remove the uterus (hysterectomy) or after pregnancy, in females. Improve weak pelvic floor muscles after prostate gland removal or surgery, in males. Kegel exercises involve squeezing your pelvic floor muscles. These are the same muscles you squeeze when you try to stop the flow of urine or keep from passing gas. The exercises can be done while sitting, standing, or lying down, but it is best to vary your position. Ask your health care provider which exercises are safe for you. Do exercises exactly as told by your health care provider and adjust them as directed. Do not begin these exercises until told by your health care provider. Exercises How to do Kegel exercises: Squeeze your pelvic floor muscles tight. You should feel a tight lift in your rectal area. If you are a male, you should also feel a tightness in your vaginal area. Keep your stomach, buttocks, and legs relaxed. Hold the muscles tight for up to 10 seconds. Breathe normally. Relax your muscles for up to 10 seconds. Repeat as told by your health care provider. Repeat this exercise daily as told by your health care provider. Continue to do this exercise for at least 4-6 weeks, or for as long as told by your health care provider. You may be referred to a physical therapist who can help you learn more about how to do Kegel exercises. Depending on your condition, your health care provider may  recommend: Varying how long you squeeze your muscles. Doing several sets of exercises every day. Doing exercises for several weeks. Making Kegel exercises a part of your regular exercise routine. This information is not intended to replace advice given to you by your health care provider. Make sure you discuss any questions you have with your health care provider. Document Revised: 01/10/2021 Document Reviewed: 01/10/2021 Elsevier Patient Education  2024 [employer].

## 2023-06-16 DIAGNOSIS — I10 Essential (primary) hypertension: Secondary | ICD-10-CM | POA: Diagnosis not present

## 2023-06-16 DIAGNOSIS — E669 Obesity, unspecified: Secondary | ICD-10-CM | POA: Diagnosis not present

## 2023-06-16 DIAGNOSIS — G4733 Obstructive sleep apnea (adult) (pediatric): Secondary | ICD-10-CM | POA: Diagnosis not present

## 2023-06-16 DIAGNOSIS — F17210 Nicotine dependence, cigarettes, uncomplicated: Secondary | ICD-10-CM | POA: Diagnosis not present

## 2023-07-02 ENCOUNTER — Other Ambulatory Visit: Payer: Self-pay | Admitting: Family Medicine

## 2023-07-02 DIAGNOSIS — I10 Essential (primary) hypertension: Secondary | ICD-10-CM

## 2023-07-02 MED ORDER — CHLORTHALIDONE 50 MG PO TABS: ORAL_TABLET | ORAL | 1 refills | Status: DC

## 2023-07-02 NOTE — Telephone Encounter (Signed)
Requested Prescriptions  Pending Prescriptions Disp Refills   chlorthalidone (HYGROTON) 50 MG tablet 90 tablet 1    Sig: TAKE 1 TABLET DAILY (DOSE   INCREASE)     Cardiovascular: Diuretics - Thiazide Failed - 07/02/2023 11:16 AM      Failed - Cr in normal range and within 180 days    Creat  Date Value Ref Range Status  11/04/2022 1.67 (H) 0.70 - 1.28 mg/dL Final   Creatinine, Ser  Date Value Ref Range Status  12/17/2022 1.38 (H) 0.61 - 1.24 mg/dL Final   Creatinine, Urine  Date Value Ref Range Status  07/29/2017 88 20 - 320 mg/dL Final         Failed - K in normal range and within 180 days    Potassium  Date Value Ref Range Status  12/17/2022 4.1 3.5 - 5.1 mmol/L Final         Failed - Na in normal range and within 180 days    Sodium  Date Value Ref Range Status  12/17/2022 141 135 - 145 mmol/L Final  11/14/2021 141 134 - 144 mmol/L Final         Failed - Last BP in normal range    BP Readings from Last 1 Encounters:  06/10/23 (!) 171/67         Passed - Valid encounter within last 6 months    Recent Outpatient Visits           1 month ago Benign essential hypertension   [facility] Danelle Berry, PA-C   8 months ago Benign essential hypertension   [facility] Danelle Berry, PA-C   11 months ago Benign essential hypertension   [facility] Danelle Berry, PA-C   1 year ago Benign essential hypertension   [facility] Mecum, Oswaldo Conroy, PA-C   1 year ago Anemia, unspecified type   [facility] Mecum, Oswaldo Conroy, PA-C       Future Appointments             In 1 month Agbor-Etang, Arlys John, MD [facility] [place]   In 4 months Danelle Berry, PA-C [facility]   In 11 months Richardo Hanks, Laurette Schimke, MD [facility] [place]

## 2023-07-02 NOTE — Telephone Encounter (Signed)
Medication Refill - Medication: chlorthalidone (HYGROTON) 50 MG tablet Pt states he has 6 pills left.   Has the patient contacted their pharmacy? Yes.    Preferred Pharmacy (with phone number or street name): [facility] - [place], [place] - [address] [facility]  Phone: [phone] Fax: [phone]  Has the patient been seen for an appointment in the last year OR does the patient have an upcoming appointment? Yes.    Agent: Please be advised that RX refills may take up to 3 business days. We ask that you follow-up with your pharmacy.

## 2023-07-23 DIAGNOSIS — I10 Essential (primary) hypertension: Secondary | ICD-10-CM | POA: Diagnosis not present

## 2023-07-23 DIAGNOSIS — N1831 Chronic kidney disease, stage 3a: Secondary | ICD-10-CM | POA: Diagnosis not present

## 2023-07-23 DIAGNOSIS — R319 Hematuria, unspecified: Secondary | ICD-10-CM | POA: Diagnosis not present

## 2023-07-29 DIAGNOSIS — G4733 Obstructive sleep apnea (adult) (pediatric): Secondary | ICD-10-CM | POA: Diagnosis not present

## 2023-07-30 DIAGNOSIS — I10 Essential (primary) hypertension: Secondary | ICD-10-CM | POA: Diagnosis not present

## 2023-07-30 DIAGNOSIS — N1831 Chronic kidney disease, stage 3a: Secondary | ICD-10-CM | POA: Diagnosis not present

## 2023-08-19 ENCOUNTER — Encounter: Payer: Self-pay | Admitting: Cardiology

## 2023-08-19 ENCOUNTER — Ambulatory Visit: Payer: Medicare HMO | Attending: Cardiology | Admitting: Cardiology

## 2023-08-19 VITALS — BP 150/78 | HR 61 | Ht 67.0 in | Wt 234.4 lb

## 2023-08-19 DIAGNOSIS — E7800 Pure hypercholesterolemia, unspecified: Secondary | ICD-10-CM

## 2023-08-19 DIAGNOSIS — I10 Essential (primary) hypertension: Secondary | ICD-10-CM | POA: Diagnosis not present

## 2023-08-19 DIAGNOSIS — Z6836 Body mass index (BMI) 36.0-36.9, adult: Secondary | ICD-10-CM

## 2023-08-19 MED ORDER — CHLORTHALIDONE 50 MG PO TABS: 50.0000 mg | ORAL_TABLET | Freq: Two times a day (BID) | ORAL | Status: DC

## 2023-08-19 MED ORDER — HYDRALAZINE HCL 50 MG PO TABS: 100.0000 mg | ORAL_TABLET | Freq: Two times a day (BID) | ORAL | 3 refills | Status: DC

## 2023-08-19 MED ORDER — CHLORTHALIDONE 50 MG PO TABS
100.0000 mg | ORAL_TABLET | Freq: Two times a day (BID) | ORAL | 3 refills | Status: DC
Start: 2023-08-19 — End: 2023-08-19

## 2023-08-19 NOTE — Progress Notes (Signed)
Cardiology Office Note:    Date:  08/19/2023   ID:  Todd Hutchinson, DOB Sep 06, 1951, MRN [id number]  PCP:  Danelle Berry, PA-C   [facility] Providers Cardiologist:  Debbe Odea, MD     Referring MD: Danelle Berry, PA-C   Chief Complaint  Patient presents with   Follow-up    Patient denies new or acute cardiac problems/concerns today.      History of Present Illness:    Todd Hutchinson is a 72 y.o. male with a hx of hypertension, hyperlipidemia, PAD, COPD, former smoker x40+ years who presents for follow-up.    Being seen for hypertension, BP still elevated after starting hydralazine 50 mg twice daily.  Systolics at home ranges in the 140s to 150s.  Tolerating meds with no adverse effects.  Eplerenone started over 4 weeks ago by nephrology, BPs at home still elevated.  Prior notes  Lower extremity ultrasound 07/2022 right ABI 0.7, left ABI 0.79 Renal artery ultrasound 01/2022 mild RAS, 1 to 59% previously was on carvedilol but did not tolerate due to abdominal discomfort and diarrhea.  Myalgias with both Pravachol and atorvastatin. Declined renal denervation procedure.   Past Medical History:  Diagnosis Date   Abnormal weight gain 03/13/2016   COPD (chronic obstructive pulmonary disease) (HCC)    Hx of malignant neoplasm of prostate 05/31/2012   Hx of tobacco use, presenting hazards to health 04/17/2016   Hypercholesterolemia 03/13/2016   Hyperlipidemia    Hypertension    Hypertensive nephrosclerosis 01/27/2018   Prostate cancer (HCC)    Sleep apnea    uses CPAP    Past Surgical History:  Procedure Laterality Date   COLONOSCOPY WITH PROPOFOL N/A 07/29/2018   Procedure: COLONOSCOPY WITH BIOPSIES;  Surgeon: Midge Minium, MD;  Location: [facility];  Service: Endoscopy;  Laterality: N/A;   POLYPECTOMY N/A 07/29/2018   Procedure: POLYPECTOMY;  Surgeon: Midge Minium, MD;  Location: [facility];  Service: Endoscopy;  Laterality: N/A;    PROSTATE SURGERY      Current Medications: Current Meds  Medication Sig   allopurinol (ZYLOPRIM) 100 MG tablet TAKE 1 TABLET BY MOUTH EVERY DAY   amLODipine-benazepril (LOTREL) 10-40 MG capsule Take 1 capsule by mouth daily.   aspirin EC 81 MG tablet Take 81 mg by mouth daily.   bisoprolol (ZEBETA) 10 MG tablet Take 1 tablet (10 mg total) by mouth daily.   Cholecalciferol (VITAMIN D-3) 25 MCG (1000 UT) CAPS Take 1 capsule (1,000 Units total) by mouth daily.   eplerenone (INSPRA) 25 MG tablet Take 25 mg by mouth daily.   ezetimibe (ZETIA) 10 MG tablet Take 1 tablet (10 mg total) by mouth daily.   Omega-3 Fatty Acids (FISH OIL) 1000 MG CAPS Take 1,000 mg by mouth daily.   [DISCONTINUED] chlorthalidone (HYGROTON) 50 MG tablet TAKE 1 TABLET DAILY (DOSE   INCREASE)   [DISCONTINUED] hydrALAZINE (APRESOLINE) 50 MG tablet Take 1 tablet (50 mg total) by mouth 2 (two) times daily.     Allergies:   Atorvastatin   Social History   Socioeconomic History   Marital status: Married    Spouse name: Elveria Rising    Number of children: 1   Years of education: Not on file   Highest education level: 12th grade  Occupational History    Comment: Retired   Tobacco Use   Smoking status: Former    Current packs/day: 0.00    Average packs/day: 1 pack/day for 40.0 years (40.0 ttl pk-yrs)    Types:  Cigarettes    Start date: 11/1973    Quit date: 11/2013    Years since quitting: 9.7   Smokeless tobacco: Never  Vaping Use   Vaping status: Never Used  Substance and Sexual Activity   Alcohol use: Yes    Alcohol/week: 16.0 standard drinks of alcohol    Types: 16 Shots of liquor per week    Comment: no more than 4 whiskey drinks a day; maybe 4 times day    Drug use: No   Sexual activity: Never    Partners: Female  Other Topics Concern   Not on file  Social History Narrative   Not on file   Social Determinants of Health   Financial Resource Strain: Low Risk  (07/02/2018)   Overall Financial Resource  Strain (CARDIA)    Difficulty of Paying Living Expenses: Not hard at all  Food Insecurity: No Food Insecurity (07/02/2018)   Hunger Vital Sign    Worried About Running Out of Food in the Last Year: Never true    Ran Out of Food in the Last Year: Never true  Transportation Needs: No Transportation Needs (04/02/2020)   PRAPARE - Administrator, Civil Service (Medical): No    Lack of Transportation (Non-Medical): No  Physical Activity: Inactive (07/05/2019)   Exercise Vital Sign    Days of Exercise per Week: 0 days    Minutes of Exercise per Session: 0 min  Stress: No Stress Concern Present (07/02/2018)   [employer] of Occupational Health - Occupational Stress Questionnaire    Feeling of Stress : Not at all  Social Connections: Moderately Integrated (07/02/2018)   Social Connection and Isolation Panel [NHANES]    Frequency of Communication with Friends and Family: More than three times a week    Frequency of Social Gatherings with Friends and Family: Once a week    Attends Religious Services: More than 4 times per year    Active Member of [employer] or Organizations: No    Attends Engineer, structural: Never    Marital Status: Married     Family History: The patient's family history includes Aneurysm (age of onset: 62) in his mother; Cancer in his brother; Heart attack in his father; Heart disease in his paternal grandfather; Heart failure in his brother; Hypertension in his brother, brother, father, mother, sister, and sister. There is no history of Kidney cancer, Kidney failure, Prostate cancer, Sickle cell anemia, or Tuberculosis.  ROS:   Please see the history of present illness.     All other systems reviewed and are negative.  EKGs/Labs/Other Studies Reviewed:    The following studies were reviewed today:   EKG:  EKG is ordered today.  EKG shows normal sinus rhythm  Recent Labs: 11/04/2022: ALT 34; Hemoglobin 12.0; Platelets 244 12/17/2022: BUN 28;  Creatinine, Ser 1.38; Potassium 4.1; Sodium 141  Recent Lipid Panel    Component Value Date/Time   CHOL 167 11/04/2022 1218   CHOL 139 04/18/2015 0823   TRIG 43 11/04/2022 1218   HDL 122 11/04/2022 1218   HDL 46 04/18/2015 0823   CHOLHDL 1.4 11/04/2022 1218   VLDL 46 (H) 04/15/2017 0921   LDLCALC 33 11/04/2022 1218     Risk Assessment/Calculations:          Physical Exam:    VS:  BP (!) 150/78 (BP Location: Left Arm, Patient Position: Sitting, Cuff Size: Large)   Pulse 61   Ht 5\' 7"  (1.702 m)   Wt 234 lb  6.4 oz (106.3 kg)   SpO2 95%   BMI 36.71 kg/m     Wt Readings from Last 3 Encounters:  08/19/23 234 lb 6.4 oz (106.3 kg)  06/10/23 237 lb (107.5 kg)  06/02/23 237 lb (107.5 kg)     GEN:  Well nourished, well developed in no acute distress HEENT: Normal NECK: No JVD; No carotid bruits CARDIAC: RRR, no murmurs, rubs, gallops RESPIRATORY:  Clear to auscultation without rales, wheezing or rhonchi  ABDOMEN: Soft, non-tender, non-distended MUSCULOSKELETAL:  No edema; No deformity  SKIN: Warm and dry NEUROLOGIC:  Alert and oriented x 3 PSYCHIATRIC:  Normal affect   ASSESSMENT:    1. Primary hypertension   2. Pure hypercholesterolemia   3. BMI 36.0-36.9,adult   4. Benign essential hypertension    PLAN:    In order of problems listed above:  Hypertension, BP elevated but improving.  Compliance with CPAP machine advised, low-salt diet, exercise also strongly encouraged.  Increase hydralazine to 100 mg twice daily, continue  bisoprolol 10 mg daily, chlorthalidone 25, Lotrel 10-40mg  qd as prescribed.  Renal artery ultrasound showed mild nonobstructive stenosis.   Patient previously declined renal denervation consideration.  Titrate hydralazine if BP not adequately controlled at follow-up visit.  Eplerenone 25 mg daily started by nephrology. Hyperlipidemia, myalgias with Pravachol.  Continue Zetia. Obesity, low-calorie diet, exercise, weight loss advised . Follow-up  in 5 months.  Medication Adjustments/Labs and Tests Ordered: Current medicines are reviewed at length with the patient today.  Concerns regarding medicines are outlined above.  Orders Placed This Encounter  Procedures   EKG 12-Lead    Meds ordered this encounter  Medications   DISCONTD: chlorthalidone (HYGROTON) 50 MG tablet    Sig: Take 2 tablets (100 mg total) by mouth 2 (two) times daily. TAKE 1 TABLET DAILY (DOSE   INCREASE)    Dispense:  180 tablet    Refill:  3   chlorthalidone (HYGROTON) 50 MG tablet    Sig: Take 1 tablet (50 mg total) by mouth 2 (two) times daily. TAKE 1 TABLET DAILY (DOSE   INCREASE)   hydrALAZINE (APRESOLINE) 50 MG tablet    Sig: Take 2 tablets (100 mg total) by mouth 2 (two) times daily.    Dispense:  180 tablet    Refill:  3     Patient Instructions  Medication Instructions:   Increase Hydralazine to 100 MG twice daily.   *If you need a refill on your cardiac medications before your next appointment, please call your pharmacy*   Lab Work: None ordered  If you have labs (blood work) drawn today and your tests are completely normal, you will receive your results only by: MyChart Message (if you have MyChart) OR A paper copy in the mail If you have any lab test that is abnormal or we need to change your treatment, we will call you to review the results.   Testing/Procedures: None ordered   Follow-Up: At [facility], you and your health needs are our priority.  As part of our continuing mission to provide you with exceptional heart care, we have created designated Provider Care Teams.  These Care Teams include your primary Cardiologist (physician) and Advanced Practice Providers (APPs -  Physician Assistants and Nurse Practitioners) who all work together to provide you with the care you need, when you need it.  We recommend signing up for the patient portal called "MyChart".  Sign up information is provided on this After Visit Summary.   MyChart  is used to connect with patients for Virtual Visits (Telemedicine).  Patients are able to view lab/test results, encounter notes, upcoming appointments, etc.  Non-urgent messages can be sent to your provider as well.   To learn more about what you can do with MyChart, go to [email or web address].    Your next appointment:   5 month(s)  Provider:   You may see Debbe Odea, MD or one of the following Advanced Practice Providers on your designated Care Team:   Nicolasa Ducking, NP Eula Listen, PA-C Cadence Fransico Michael, PA-C Charlsie Quest, NP Carlos Levering, NP       Signed, Debbe Odea, MD  08/19/2023 12:38 PM    [facility]

## 2023-08-19 NOTE — Patient Instructions (Addendum)
Medication Instructions:   Increase Hydralazine to 100 MG twice daily.   *If you need a refill on your cardiac medications before your next appointment, please call your pharmacy*   Lab Work: None ordered  If you have labs (blood work) drawn today and your tests are completely normal, you will receive your results only by: MyChart Message (if you have MyChart) OR A paper copy in the mail If you have any lab test that is abnormal or we need to change your treatment, we will call you to review the results.   Testing/Procedures: None ordered   Follow-Up: At [facility], you and your health needs are our priority.  As part of our continuing mission to provide you with exceptional heart care, we have created designated Provider Care Teams.  These Care Teams include your primary Cardiologist (physician) and Advanced Practice Providers (APPs -  Physician Assistants and Nurse Practitioners) who all work together to provide you with the care you need, when you need it.  We recommend signing up for the patient portal called "MyChart".  Sign up information is provided on this After Visit Summary.  MyChart is used to connect with patients for Virtual Visits (Telemedicine).  Patients are able to view lab/test results, encounter notes, upcoming appointments, etc.  Non-urgent messages can be sent to your provider as well.   To learn more about what you can do with MyChart, go to [email or web address].    Your next appointment:   5 month(s)  Provider:   You may see Debbe Odea, MD or one of the following Advanced Practice Providers on your designated Care Team:   Nicolasa Ducking, NP Eula Listen, PA-C Cadence Fransico Michael, PA-C Charlsie Quest, NP Carlos Levering, NP

## 2023-08-20 ENCOUNTER — Other Ambulatory Visit: Payer: Self-pay | Admitting: Emergency Medicine

## 2023-08-20 DIAGNOSIS — I10 Essential (primary) hypertension: Secondary | ICD-10-CM

## 2023-08-20 MED ORDER — CHLORTHALIDONE 50 MG PO TABS: 50.0000 mg | ORAL_TABLET | Freq: Two times a day (BID) | ORAL | 0 refills | Status: DC

## 2023-08-24 ENCOUNTER — Telehealth: Payer: Self-pay | Admitting: Cardiology

## 2023-08-24 NOTE — Telephone Encounter (Signed)
Pt c/o medication issue:  1. Name of Medication: hydrALAZINE (APRESOLINE) 50 MG tablet   2. How are you currently taking this medication (dosage and times per day)? Take 2 tablets (100 mg total) by mouth 2 (two) times daily. - Oral   3. Are you having a reaction (difficulty breathing--STAT)? No   4. What is your medication issue?  Pt called in stating this rx was supposed to be for 100 mg tablets instead of 50 mg tablets. Please advise.

## 2023-08-25 MED ORDER — HYDRALAZINE HCL 100 MG PO TABS: 100.0000 mg | ORAL_TABLET | Freq: Two times a day (BID) | ORAL | 3 refills | Status: DC

## 2023-08-25 NOTE — Telephone Encounter (Signed)
Per Dr. Azucena Cecil, ok to change to hydralazine to 100 mg BID instead of 50 mg 2 tablets BID. Pt made aware and updated Rx sent to requested pharmacy.

## 2023-10-16 ENCOUNTER — Other Ambulatory Visit: Payer: Self-pay

## 2023-10-16 MED ORDER — BISOPROLOL FUMARATE 10 MG PO TABS: 10.0000 mg | ORAL_TABLET | Freq: Every day | ORAL | 0 refills | Status: DC

## 2023-10-17 ENCOUNTER — Other Ambulatory Visit: Payer: Self-pay | Admitting: Family Medicine

## 2023-10-17 DIAGNOSIS — M1A0790 Idiopathic chronic gout, unspecified ankle and foot, without tophus (tophi): Secondary | ICD-10-CM

## 2023-10-19 ENCOUNTER — Other Ambulatory Visit: Payer: Self-pay

## 2023-10-19 MED ORDER — EZETIMIBE 10 MG PO TABS: 10.0000 mg | ORAL_TABLET | Freq: Every day | ORAL | 2 refills | Status: DC

## 2023-10-19 NOTE — Telephone Encounter (Signed)
Requested Prescriptions   Signed Prescriptions Disp Refills   ezetimibe (ZETIA) 10 MG tablet 90 tablet 2    Sig: Take 1 tablet (10 mg total) by mouth daily.    Authorizing Provider: Debbe Odea    Ordering User: Guerry Minors   Last office visit:  08/19/23  with plan to f/u in 5 months  Next office visit: none/active recall

## 2023-10-19 NOTE — Telephone Encounter (Signed)
Requested Prescriptions  Pending Prescriptions Disp Refills   allopurinol (ZYLOPRIM) 100 MG tablet [Pharmacy Med Name: ALLOPURINOL 100 MG TABLET] 90 tablet 1    Sig: TAKE 1 TABLET BY MOUTH EVERY DAY     Endocrinology:  Gout Agents - allopurinol Failed - 10/19/2023  1:54 PM      Failed - Uric Acid in normal range and within 360 days    Uric Acid, Serum  Date Value Ref Range Status  08/01/2022 7.0 4.0 - 8.0 mg/dL Final    Comment:    Therapeutic target for gout patients: <6.0 mg/dL .          Failed - Cr in normal range and within 360 days    Creat  Date Value Ref Range Status  11/04/2022 1.67 (H) 0.70 - 1.28 mg/dL Final   Creatinine, Ser  Date Value Ref Range Status  12/17/2022 1.38 (H) 0.61 - 1.24 mg/dL Final   Creatinine, Urine  Date Value Ref Range Status  07/29/2017 88 20 - 320 mg/dL Final         Passed - Valid encounter within last 12 months    Recent Outpatient Visits           5 months ago Benign essential hypertension   [facility] Danelle Berry, PA-C   11 months ago Benign essential hypertension   [facility] Danelle Berry, PA-C   1 year ago Benign essential hypertension   [facility] Danelle Berry, PA-C   1 year ago Benign essential hypertension   [facility] Mecum, Oswaldo Conroy, PA-C   1 year ago Anemia, unspecified type   [facility] Mecum, Oswaldo Conroy, PA-C       Future Appointments             In 3 weeks Danelle Berry, PA-C [facility]   In 7 months Sondra Come, MD [facility] [place]            Passed - CBC within normal limits and completed in the last 12 months    WBC  Date Value Ref Range Status  11/04/2022 8.2 3.8 - 10.8 Thousand/uL Final   RBC  Date Value Ref Range Status  11/04/2022 4.22 4.20 - 5.80 Million/uL Final   Hemoglobin  Date Value Ref Range Status  11/04/2022  12.0 (L) 13.2 - 17.1 g/dL Final  16/06/9603 54.0 (L) 12.6 - 17.7 g/dL Final   HCT  Date Value Ref Range Status  11/04/2022 36.5 (L) 38.5 - 50.0 % Final   Hematocrit  Date Value Ref Range Status  04/18/2015 33.0 (L) 37.5 - 51.0 % Final   MCHC  Date Value Ref Range Status  11/04/2022 32.9 32.0 - 36.0 g/dL Final   [facility]  Date Value Ref Range Status  11/04/2022 28.4 27.0 - 33.0 pg Final   MCV  Date Value Ref Range Status  11/04/2022 86.5 80.0 - 100.0 fL Final  04/18/2015 78 (L) 79 - 97 fL Final  11/03/2013 78 (L) 80 - 100 fL Final   No results found for: "PLTCOUNTKUC", "LABPLAT", "POCPLA" RDW  Date Value Ref Range Status  11/04/2022 14.2 11.0 - 15.0 % Final  04/18/2015 16.1 (H) 12.3 - 15.4 % Final  11/03/2013 18.7 (H) 11.5 - 14.5 % Final

## 2023-10-27 DIAGNOSIS — G4733 Obstructive sleep apnea (adult) (pediatric): Secondary | ICD-10-CM | POA: Diagnosis not present

## 2023-11-12 ENCOUNTER — Other Ambulatory Visit: Payer: Self-pay | Admitting: *Deleted

## 2023-11-12 DIAGNOSIS — I10 Essential (primary) hypertension: Secondary | ICD-10-CM

## 2023-11-12 MED ORDER — CHLORTHALIDONE 50 MG PO TABS: 50.0000 mg | ORAL_TABLET | Freq: Two times a day (BID) | ORAL | 2 refills | Status: DC

## 2023-11-13 ENCOUNTER — Ambulatory Visit (INDEPENDENT_AMBULATORY_CARE_PROVIDER_SITE_OTHER): Payer: Medicare HMO | Admitting: Family Medicine

## 2023-11-13 ENCOUNTER — Encounter: Payer: Self-pay | Admitting: Family Medicine

## 2023-11-13 ENCOUNTER — Encounter: Payer: Medicare HMO | Admitting: Family Medicine

## 2023-11-13 VITALS — BP 136/70 | HR 81 | Resp 16 | Ht 67.0 in | Wt 228.0 lb

## 2023-11-13 DIAGNOSIS — E0822 Diabetes mellitus due to underlying condition with diabetic chronic kidney disease: Secondary | ICD-10-CM

## 2023-11-13 DIAGNOSIS — Z136 Encounter for screening for cardiovascular disorders: Secondary | ICD-10-CM

## 2023-11-13 DIAGNOSIS — Z5320 Procedure and treatment not carried out because of patient's decision for unspecified reasons: Secondary | ICD-10-CM | POA: Diagnosis not present

## 2023-11-13 DIAGNOSIS — Z0000 Encounter for general adult medical examination without abnormal findings: Secondary | ICD-10-CM

## 2023-11-13 DIAGNOSIS — Z0001 Encounter for general adult medical examination with abnormal findings: Secondary | ICD-10-CM | POA: Diagnosis not present

## 2023-11-13 DIAGNOSIS — J449 Chronic obstructive pulmonary disease, unspecified: Secondary | ICD-10-CM | POA: Diagnosis not present

## 2023-11-13 DIAGNOSIS — I10 Essential (primary) hypertension: Secondary | ICD-10-CM | POA: Diagnosis not present

## 2023-11-13 DIAGNOSIS — Z1211 Encounter for screening for malignant neoplasm of colon: Secondary | ICD-10-CM

## 2023-11-13 DIAGNOSIS — Z6837 Body mass index (BMI) 37.0-37.9, adult: Secondary | ICD-10-CM | POA: Diagnosis not present

## 2023-11-13 DIAGNOSIS — N1832 Chronic kidney disease, stage 3b: Secondary | ICD-10-CM

## 2023-11-13 DIAGNOSIS — Z789 Other specified health status: Secondary | ICD-10-CM

## 2023-11-13 DIAGNOSIS — C61 Malignant neoplasm of prostate: Secondary | ICD-10-CM

## 2023-11-13 NOTE — Progress Notes (Signed)
 Patient: Todd Hutchinson, Male    DOB: [birth year], 73 y.o.   MRN: [id number] Danelle Berry, PA-C Visit Date: 11/13/2023  Today's Provider: Danelle Berry, PA-C   Chief Complaint  Patient presents with   Annual Exam   Subjective:   Annual physical exam:  Todd Hutchinson is a 72 y.o. male who presents today for health maintenance and annual & complete physical exam.   Exercise/Activity:  not exercising Diet/nutrition:  doesn't eat that much, pretty small portions Sleep:  CPAP sleeping better this last couple weeks  SDOH Screenings   Food Insecurity: No Food Insecurity (11/10/2023)  Housing: Low Risk  (11/10/2023)  Transportation Needs: No Transportation Needs (11/10/2023)  Utilities: Not At Risk (11/13/2023)  Alcohol Screen: Medium Risk (11/10/2023)  Depression (PHQ2-9): Low Risk  (11/13/2023)  Financial Resource Strain: Low Risk  (11/10/2023)  Physical Activity: Inactive (11/13/2023)  Social Connections: Socially Integrated (11/10/2023)  Stress: No Stress Concern Present (11/10/2023)  Tobacco Use: Medium Risk (11/13/2023)  Health Literacy: Adequate Health Literacy (11/13/2023)   USPSTF grade A and B recommendations - reviewed and addressed today  Depression:  Phq 9 completed today by patient, was reviewed by me with patient in the room, score is  negative, pt feels good    11/13/2023   11:19 AM 05/13/2023    2:23 PM 11/04/2022   10:48 AM  Depression screen PHQ 2/9  Decreased Interest 0 0 0  Down, Depressed, Hopeless 0 0 0  PHQ - 2 Score 0 0 0  Altered sleeping 0 0 0  Tired, decreased energy 0 0 0  Change in appetite 0 0 0  Feeling bad or failure about yourself  0 0 0  Trouble concentrating 0 0 0  Moving slowly or fidgety/restless 0 0 0  Suicidal thoughts 0 0 0  PHQ-9 Score 0 0 0  Difficult doing work/chores  Not difficult at all Not difficult at all    Hep C Screening: done  STD testing and prevention (HIV/chl/gon/syphilis): done previously, no new risk or  exposure  Intimate partner violence:  safe   Advanced Care Planning:  A voluntary discussion about advance care planning including the explanation and discussion of advance directives.  Discussed health care proxy and Living will, and the patient was able to identify a health care proxy as wife.    Health Maintenance  Topic Date Due   Lung Cancer Screening  Never done   COVID-19 Vaccine (3 - Moderna risk series) 01/07/2020   Medicare Annual Wellness (AWV)  07/04/2020   Colonoscopy  07/30/2023   Zoster Vaccines- Shingrix (1 of 2) 02/10/2024 (Originally 12/08/1969)   Pneumonia Vaccine 11+ Years old  Completed   INFLUENZA VACCINE  Completed   Hepatitis C Screening  Completed   HPV VACCINES  Aged Out   DTaP/Tdap/Td  Discontinued   Skin cancer:   last skin survey was.  Pt reports denies hx of skin cancer, suspicious lesions/biopsies in the past.  Colorectal cancer:  colonoscopy is DUE   Pt denies melena, hematochezia, change in BM pattern or caliber    Prostate cancer:  managing with urology sees every 6 months - Sninsky  Prostate cancer screening with PSA: Discussed risks and benefits of PSA testing and provided handout. Pt will have PSA drawn today. No results found for: "PSA" 5 months ago 0.3 1 year ago   Urinary Symptoms:   IPSS     Row Name 11/13/23 1123         International Prostate  Symptom Score   How often have you had the sensation of not emptying your bladder? Not at All     How often have you had to urinate less than every two hours? Not at All     How often have you found you stopped and started again several times when you urinated? Not at All     How often have you found it difficult to postpone urination? Not at All     How often have you had a weak urinary stream? Not at All     How often have you had to strain to start urination? Not at All     How many times did you typically get up at night to urinate? 1 Time     Total IPSS Score 1             Lung  cancer:   Low Dose CT Chest recommended if Age 73-80 years, 20 pack-year currently smoking OR have quit w/in 15years. Patient does qualify.   Social History   Tobacco Use   Smoking status: Former    Current packs/day: 0.00    Average packs/day: 1 pack/day for 40.0 years (40.0 ttl pk-yrs)    Types: Cigarettes    Start date: 11/1973    Quit date: 11/2013    Years since quitting: 10.0   Smokeless tobacco: Never  Substance Use Topics   Alcohol use: Yes    Alcohol/week: 16.0 standard drinks of alcohol    Types: 16 Shots of liquor per week    Comment: no more than 4 whiskey drinks a day; maybe 4 times day      Alcohol screening: Flowsheet Row Office Visit from 11/13/2023 in [facility]  AUDIT-C Score 7      Last vitamin D Lab Results  Component Value Date   VD25OH 37 (L) 04/26/2021   AAA:  The USPSTF recommends one-time screening with ultrasonography in men ages 27 to 14 years who have ever smoked  WUJ:WJXB with cardiology  Blood pressure/Hypertension: BP Readings from Last 3 Encounters:  11/13/23 136/70  08/19/23 (!) 150/78  06/10/23 (!) 171/67   Weight/Obesity: Wt Readings from Last 3 Encounters:  11/13/23 228 lb (103.4 kg)  08/19/23 234 lb 6.4 oz (106.3 kg)  06/10/23 237 lb (107.5 kg)   BMI Readings from Last 3 Encounters:  11/13/23 35.71 kg/m  08/19/23 36.71 kg/m  06/10/23 37.12 kg/m    Lipids:  Lab Results  Component Value Date   CHOL 167 11/04/2022   CHOL 149 11/05/2021   CHOL 177 10/11/2020   Lab Results  Component Value Date   HDL 122 11/04/2022   HDL 62 11/05/2021   HDL 70 10/11/2020   Lab Results  Component Value Date   LDLCALC 33 11/04/2022   LDLCALC 71 11/05/2021   LDLCALC 89 10/11/2020   Lab Results  Component Value Date   TRIG 43 11/04/2022   TRIG 78 11/05/2021   TRIG 87 10/11/2020   Lab Results  Component Value Date   CHOLHDL 1.4 11/04/2022   CHOLHDL 2.4 11/05/2021   CHOLHDL 2.5 10/11/2020   No  results found for: "LDLDIRECT" Based on the results of lipid panel his/her cardiovascular risk factor ( using Poole Cohort )  in the next 10 years is : The ASCVD Risk score (Arnett DK, et al., 2019) failed to calculate for the following reasons:   The valid HDL cholesterol range is 20 to 100 mg/dL Glucose:  Glucose, Bld  Date Value Ref Range Status  12/17/2022 99 70 - 99 mg/dL Final    Comment:    Glucose reference range applies only to samples taken after fasting for at least 8 hours.  11/04/2022 91 65 - 99 mg/dL Final    Comment:    .            Fasting reference interval .   08/01/2022 83 65 - 99 mg/dL Final    Comment:    .            Fasting reference interval .     Social History       Social History   Socioeconomic History   Marital status: Married    Spouse name: Elveria Rising    Number of children: 1   Years of education: Not on file   Highest education level: 12th grade  Occupational History    Comment: Retired   Tobacco Use   Smoking status: Former    Current packs/day: 0.00    Average packs/day: 1 pack/day for 40.0 years (40.0 ttl pk-yrs)    Types: Cigarettes    Start date: 11/1973    Quit date: 11/2013    Years since quitting: 10.0   Smokeless tobacco: Never  Vaping Use   Vaping status: Never Used  Substance and Sexual Activity   Alcohol use: Yes    Alcohol/week: 16.0 standard drinks of alcohol    Types: 16 Shots of liquor per week    Comment: no more than 4 whiskey drinks a day; maybe 4 times day    Drug use: No   Sexual activity: Not Currently    Partners: Female  Other Topics Concern   Not on file  Social History Narrative   Not on file   Social Drivers of Health   Financial Resource Strain: Low Risk  (11/10/2023)   Overall Financial Resource Strain (CARDIA)    Difficulty of Paying Living Expenses: Not hard at all  Food Insecurity: No Food Insecurity (11/10/2023)   Hunger Vital Sign    Worried About Running Out of Food in the Last Year: Never  true    Ran Out of Food in the Last Year: Never true  Transportation Needs: No Transportation Needs (11/10/2023)   PRAPARE - Administrator, Civil Service (Medical): No    Lack of Transportation (Non-Medical): No  Physical Activity: Inactive (11/13/2023)   Exercise Vital Sign    Days of Exercise per Week: 0 days    Minutes of Exercise per Session: 0 min  Stress: No Stress Concern Present (11/10/2023)   [employer] of Occupational Health - Occupational Stress Questionnaire    Feeling of Stress : Not at all  Social Connections: Socially Integrated (11/10/2023)   Social Connection and Isolation Panel [NHANES]    Frequency of Communication with Friends and Family: More than three times a week    Frequency of Social Gatherings with Friends and Family: Once a week    Attends Religious Services: More than 4 times per year    Active Member of [employer] or Organizations: Yes    Attends Engineer, structural: More than 4 times per year    Marital Status: Married   Family History        Family History  Problem Relation Age of Onset   Hypertension Mother    Aneurysm Mother 85       brain   Hypertension Father    Heart attack Father    Hypertension  Sister    Hypertension Brother    Heart failure Brother    Heart disease Paternal Grandfather    Hypertension Sister    Hypertension Brother    Cancer Brother        throat cancer   Kidney cancer Neg Hx    Kidney failure Neg Hx    Prostate cancer Neg Hx    Sickle cell anemia Neg Hx    Tuberculosis Neg Hx     Patient Active Problem List   Diagnosis Date Noted   Myalgia due to statin 05/13/2023   Atherosclerosis of native artery of both lower extremities with intermittent claudication (HCC) 11/04/2022   Abnormal ankle brachial index (ABI) 08/04/2022   Chronic gout of foot 08/01/2022   Anemia 10/11/2020   Irregular heart rhythm 10/11/2020   Class 2 severe obesity with serious comorbidity and body mass index (BMI) of  37.0 to 37.9 in adult [facility]) 11/02/2018   History of colonic polyps    Benign neoplasm of ascending colon    Benign neoplasm of cecum    Hypertensive nephrosclerosis 01/27/2018   Prediabetes 06/30/2017   COPD (chronic obstructive pulmonary disease) (HCC) 04/15/2017   Hx of tobacco use, presenting hazards to health 04/17/2016   Hyperlipidemia 03/13/2016   Benign essential hypertension 07/10/2015   Stage 3 chronic kidney disease (HCC) 07/10/2015   Cystitis, radiation 05/31/2012   Hematuria, microscopic 05/31/2012   ED (erectile dysfunction) of organic origin 05/31/2012   Hx of malignant neoplasm of prostate 05/31/2012    Past Surgical History:  Procedure Laterality Date   COLONOSCOPY WITH PROPOFOL N/A 07/29/2018   Procedure: COLONOSCOPY WITH BIOPSIES;  Surgeon: Midge Minium, MD;  Location: [facility];  Service: Endoscopy;  Laterality: N/A;   POLYPECTOMY N/A 07/29/2018   Procedure: POLYPECTOMY;  Surgeon: Midge Minium, MD;  Location: [facility];  Service: Endoscopy;  Laterality: N/A;   PROSTATE SURGERY       Current Outpatient Medications:    allopurinol (ZYLOPRIM) 100 MG tablet, TAKE 1 TABLET BY MOUTH EVERY DAY, Disp: 90 tablet, Rfl: 1   amLODipine-benazepril (LOTREL) 10-40 MG capsule, Take 1 capsule by mouth daily., Disp: 90 capsule, Rfl: 1   aspirin EC 81 MG tablet, Take 81 mg by mouth daily., Disp: , Rfl:    bisoprolol (ZEBETA) 10 MG tablet, Take 1 tablet (10 mg total) by mouth daily., Disp: 90 tablet, Rfl: 0   chlorthalidone (HYGROTON) 50 MG tablet, Take 1 tablet (50 mg total) by mouth 2 (two) times daily., Disp: 180 tablet, Rfl: 2   Cholecalciferol (VITAMIN D-3) 25 MCG (1000 UT) CAPS, Take 1 capsule (1,000 Units total) by mouth daily., Disp: 90 capsule, Rfl: 1   eplerenone (INSPRA) 25 MG tablet, Take 25 mg by mouth daily., Disp: , Rfl:    ezetimibe (ZETIA) 10 MG tablet, Take 1 tablet (10 mg total) by mouth daily., Disp: 90 tablet, Rfl: 2   hydrALAZINE  (APRESOLINE) 100 MG tablet, Take 1 tablet (100 mg total) by mouth 2 (two) times daily., Disp: 180 tablet, Rfl: 3   Omega-3 Fatty Acids (FISH OIL) 1000 MG CAPS, Take 1,000 mg by mouth daily., Disp: , Rfl:   Allergies  Allergen Reactions   Atorvastatin Rash    Patient Care Team: Danelle Berry, PA-C as PCP - General (Family Medicine) Debbe Odea, MD as PCP - Cardiology (Cardiology) Midge Minium, MD as Consulting Physician (Gastroenterology) Lamont Dowdy, MD as Consulting Physician (Nephrology) Shane Crutch, MD as Consulting Physician (Pulmonary Disease) Sondra Come, MD  as Consulting Physician (Urology)   Chart Review: I personally reviewed active problem list, medication list, allergies, family history, social history, health maintenance, notes from last encounter, lab results, imaging with the patient/caregiver today.   Review of Systems  Constitutional: Negative.   HENT: Negative.    Eyes: Negative.   Respiratory: Negative.    Cardiovascular: Negative.   Gastrointestinal: Negative.   Endocrine: Negative.   Genitourinary: Negative.   Musculoskeletal: Negative.   Skin: Negative.   Allergic/Immunologic: Negative.   Neurological: Negative.   Hematological: Negative.   Psychiatric/Behavioral: Negative.    All other systems reviewed and are negative.         Objective:   Vitals:  Vitals:   11/13/23 1120  BP: 136/70  Pulse: 81  Resp: 16  SpO2: 99%  Weight: 228 lb (103.4 kg)  Height: 5\' 7"  (1.702 m)    Body mass index is 35.71 kg/m.  Physical Exam Constitutional:      General: He is not in acute distress.    Appearance: Normal appearance. He is well-developed. He is obese. He is not ill-appearing, toxic-appearing or diaphoretic.  HENT:     Head: Normocephalic and atraumatic.     Jaw: No trismus.     Right Ear: Tympanic membrane, ear canal and external ear normal. There is no impacted cerumen.     Left Ear: Tympanic membrane, ear canal and  external ear normal. There is no impacted cerumen.     Nose: Nose normal. No mucosal edema, congestion or rhinorrhea.     Right Sinus: No maxillary sinus tenderness or frontal sinus tenderness.     Left Sinus: No maxillary sinus tenderness or frontal sinus tenderness.     Mouth/Throat:     Mouth: Mucous membranes are moist.     Pharynx: Oropharynx is clear. Uvula midline. No oropharyngeal exudate, posterior oropharyngeal erythema or uvula swelling.  Eyes:     General: Lids are normal.        Right eye: No discharge.        Left eye: No discharge.     Conjunctiva/sclera: Conjunctivae normal.  Neck:     Trachea: Trachea and phonation normal. No tracheal deviation.  Cardiovascular:     Rate and Rhythm: Normal rate and regular rhythm.     Pulses: Normal pulses.          Radial pulses are 2+ on the right side and 2+ on the left side.       Posterior tibial pulses are 2+ on the right side and 2+ on the left side.     Heart sounds: Normal heart sounds. No murmur heard.    No friction rub. No gallop.  Pulmonary:     Effort: Pulmonary effort is normal.     Breath sounds: Normal breath sounds. No wheezing, rhonchi or rales.  Abdominal:     General: Bowel sounds are normal.     Palpations: Abdomen is soft.  Musculoskeletal:     Cervical back: Normal range of motion and neck supple.     Right lower leg: Edema present.     Left lower leg: Edema present.  Skin:    General: Skin is warm and dry.     Capillary Refill: Capillary refill takes less than 2 seconds.     Findings: No rash.  Neurological:     Mental Status: He is alert and oriented to person, place, and time.     Gait: Gait normal.  Psychiatric:  Mood and Affect: Mood normal.        Speech: Speech normal.        Behavior: Behavior normal.      No results found for this or any previous visit (from the past 2160 hours).  Fall Risk:    11/13/2023   11:19 AM 05/13/2023    2:23 PM 11/04/2022   10:48 AM 08/01/2022   11:14  AM 01/29/2022   11:38 AM  Fall Risk   Falls in the past year? 0 0 0 0 0  Number falls in past yr: 0 0 0 0 0  Injury with Fall? 0 0 0 0 0  Risk for fall due to : No Fall Risks No Fall Risks No Fall Risks No Fall Risks No Fall Risks  Follow up Falls prevention discussed Falls prevention discussed;Falls evaluation completed;Education provided Falls prevention discussed;Education provided;Falls evaluation completed Falls prevention discussed;Education provided;Falls evaluation completed Falls prevention discussed    Functional Status Survey: Is the patient deaf or have difficulty hearing?: No Does the patient have difficulty seeing, even when wearing glasses/contacts?: No Does the patient have difficulty concentrating, remembering, or making decisions?: No Does the patient have difficulty walking or climbing stairs?: No Does the patient have difficulty dressing or bathing?: No Does the patient have difficulty doing errands alone such as visiting a doctor's office or shopping?: No   Assessment & Plan:    CPE completed today  Prostate cancer screening and PSA options (with potential risks and benefits of testing vs not testing) were discussed along with recent recs/guidelines, shared decision making and handout/information given to pt today  USPSTF grade A and B recommendations reviewed with patient; age-appropriate recommendations, preventive care, screening tests, etc discussed and encouraged; healthy living encouraged; see AVS for patient education given to patient  Discussed importance of 150 minutes of physical activity weekly, AHA exercise recommendations given to pt in AVS/handout  Discussed importance of healthy diet:  eating lean meats and proteins, avoiding trans fats and saturated fats, avoid simple sugars and excessive carbs in diet, eat 6 servings of fruit/vegetables daily and drink plenty of water and avoid sweet beverages.  DASH diet reviewed if pt has HTN  Recommended pt to do  annual eye exam and routine dental exams/cleanings  Advance Care planning information and packet discussed and offered today, encouraged pt to discuss with family members/spouse/partner/friends and complete Advanced directive packet and bring copy to office   Reviewed Health Maintenance: Health Maintenance  Topic Date Due   Lung Cancer Screening  Never done   COVID-19 Vaccine (3 - Moderna risk series) 01/07/2020   Medicare Annual Wellness (AWV)  07/04/2020   Colonoscopy  07/30/2023   Zoster Vaccines- Shingrix (1 of 2) 02/10/2024 (Originally 12/08/1969)   Pneumonia Vaccine 21+ Years old  Completed   INFLUENZA VACCINE  Completed   Hepatitis C Screening  Completed   HPV VACCINES  Aged Out   DTaP/Tdap/Td  Discontinued    Immunizations: Immunization History  Administered Date(s) Administered   Fluad Quad(high Dose 65+) 06/20/2019, 06/18/2020   Influenza, High Dose Seasonal PF 06/04/2018, 06/04/2023   Influenza-Unspecified 06/04/2018, 06/29/2021, 06/24/2022   Moderna Sars-Covid-2 Vaccination 11/12/2019, 12/10/2019   Pneumococcal Conjugate-13 04/15/2017   Pneumococcal Polysaccharide-23 06/18/2015, 10/11/2020   Vaccines:  HPV: up to at age 36 , ask insurance if age between 70-45  Shingrix: 7-64 yo and ask insurance if covered when patient above 56 yo Pneumonia:  educated and discussed with patient. Flu:  educated and discussed with patient. COVID:  ICD-10-CM   1. Well adult exam  Z00.00 CBC with Differential/Platelet    COMPLETE METABOLIC PANEL WITH GFR    Hemoglobin A1c    Lipid panel    PSA   done today    2. Colon cancer screening  Z12.11 Ambulatory referral to Gastroenterology    3. Stage 3b chronic kidney disease (HCC)  N18.32 COMPLETE METABOLIC PANEL WITH GFR    VITAMIN D 25 Hydroxy (Vit-D Deficiency, Fractures)   seeing nephrology - Kolluru    4. Prostate cancer (HCC)  C61 PSA   seeing urology q 6 m, Sninsky    5. Encounter for abdominal aortic aneurysm (AAA)  screening  Z13.6 US AORTA MEDICARE SCREENING   recommended    6. Lung cancer screening declined by patient  Z53.20    recommende with 40 pack year hx, stopped smoking 10 years ago    7. Benign essential hypertension  I10    has been poorly controlled, closer to goal today    8. Alcohol use  Z78.9    AUDIT positive, 14-16 liquor drinks weekly, educated pt on risks, encouraged to decrease use/intake    9. Diabetes mellitus due to underlying condition with stage 3 chronic kidney disease, without long-term current use of insulin, unspecified whether stage 3a or 3b CKD (HCC) Chronic E08.22    N18.30    recheck A1C today, last was at goal    10. Chronic obstructive pulmonary disease, unspecified COPD type (HCC) Chronic J44.9    not on inhalers, no current sx or concerns    11. Class 2 severe obesity with serious comorbidity and body mass index (BMI) of 37.0 to 37.9 in adult, unspecified obesity type [facility]) Chronic E66.812    E66.01    Z68.37    weight down a little, multiple comorbid conditions, HTN, HLD, OSA, CKD          Danelle Berry, PA-C 11/13/23 11:43 AM  [facility]

## 2023-11-14 LAB — CBC WITH DIFFERENTIAL/PLATELET
Absolute Lymphocytes: 1277 {cells}/uL (ref 850–3900)
Absolute Monocytes: 842 {cells}/uL (ref 200–950)
Basophils Absolute: 50 {cells}/uL (ref 0–200)
Basophils Relative: 0.5 %
Eosinophils Absolute: 129 {cells}/uL (ref 15–500)
Eosinophils Relative: 1.3 %
HCT: 35.7 % — ABNORMAL LOW (ref 38.5–50.0)
Hemoglobin: 11.4 g/dL — ABNORMAL LOW (ref 13.2–17.1)
MCH: 27.7 pg (ref 27.0–33.0)
MCHC: 31.9 g/dL — ABNORMAL LOW (ref 32.0–36.0)
MCV: 86.7 fL (ref 80.0–100.0)
MPV: 11.9 fL (ref 7.5–12.5)
Monocytes Relative: 8.5 %
Neutro Abs: 7603 {cells}/uL (ref 1500–7800)
Neutrophils Relative %: 76.8 %
Platelets: 263 10*3/uL (ref 140–400)
RBC: 4.12 10*6/uL — ABNORMAL LOW (ref 4.20–5.80)
RDW: 14.1 % (ref 11.0–15.0)
Total Lymphocyte: 12.9 %
WBC: 9.9 10*3/uL (ref 3.8–10.8)

## 2023-11-14 LAB — LIPID PANEL
Cholesterol: 170 mg/dL (ref <200)
HDL: 78 mg/dL (ref >=40)
LDL Cholesterol (Calc): 75 mg/dL
Non-HDL Cholesterol (Calc): 92 mg/dL (ref <130)
Total CHOL/HDL Ratio: 2.2 (calc) (ref <5.0)
Triglycerides: 89 mg/dL (ref <150)

## 2023-11-14 LAB — COMPLETE METABOLIC PANEL WITH GFR
AG Ratio: 1.2 (calc) (ref 1.0–2.5)
ALT: 12 U/L (ref 9–46)
AST: 11 U/L (ref 10–35)
Albumin: 4.2 g/dL (ref 3.6–5.1)
Alkaline phosphatase (APISO): 35 U/L (ref 35–144)
BUN/Creatinine Ratio: 20 (calc) (ref 6–22)
BUN: 28 mg/dL — ABNORMAL HIGH (ref 7–25)
CO2: 22 mmol/L (ref 20–32)
Calcium: 10.4 mg/dL — ABNORMAL HIGH (ref 8.6–10.3)
Chloride: 106 mmol/L (ref 98–110)
Creat: 1.39 mg/dL — ABNORMAL HIGH (ref 0.70–1.28)
Globulin: 3.5 g/dL (ref 1.9–3.7)
Glucose, Bld: 106 mg/dL — ABNORMAL HIGH (ref 65–99)
Potassium: 4.4 mmol/L (ref 3.5–5.3)
Sodium: 139 mmol/L (ref 135–146)
Total Bilirubin: 0.4 mg/dL (ref 0.2–1.2)
Total Protein: 7.7 g/dL (ref 6.1–8.1)
eGFR: 54 mL/min/{1.73_m2} — ABNORMAL LOW (ref >=60)

## 2023-11-14 LAB — HEMOGLOBIN A1C
Hgb A1c MFr Bld: 5.3 %{Hb} (ref <5.7)
Mean Plasma Glucose: 105 mg/dL
eAG (mmol/L): 5.8 mmol/L

## 2023-11-14 LAB — VITAMIN D 25 HYDROXY (VIT D DEFICIENCY, FRACTURES): Vit D, 25-Hydroxy: 37 ng/mL (ref 30–100)

## 2023-11-14 LAB — PSA: PSA: 0.46 ng/mL (ref <=4.00)

## 2023-11-16 ENCOUNTER — Encounter: Payer: Self-pay | Admitting: Interventional Radiology

## 2023-11-18 ENCOUNTER — Encounter: Payer: Self-pay | Admitting: Family Medicine

## 2023-11-19 ENCOUNTER — Telehealth: Payer: Self-pay

## 2023-11-19 ENCOUNTER — Other Ambulatory Visit: Payer: Self-pay

## 2023-11-19 DIAGNOSIS — Z860100 Personal history of colon polyps, unspecified: Secondary | ICD-10-CM

## 2023-11-19 MED ORDER — NA SULFATE-K SULFATE-MG SULF 17.5-3.13-1.6 GM/177ML PO SOLN: 1.0000 | Freq: Once | ORAL | 0 refills | Status: DC

## 2023-11-19 MED ORDER — GOLYTELY 236 G PO SOLR: 4000.0000 mL | Freq: Once | ORAL | 0 refills | Status: AC

## 2023-11-19 NOTE — Telephone Encounter (Signed)
 Gastroenterology Pre-Procedure Review  Request Date: 12/24/23 Requesting Physician: Dr. Servando Snare  PATIENT REVIEW QUESTIONS: The patient responded to the following health history questions as indicated:    1. Are you having any GI issues? no 2. Do you have a personal history of Polyps? Yes last colonoscopy performed by Dr. Servando Snare 07/29/18 recommended repeat in 5 years 3. Do you have a family history of Colon Cancer or Polyps? no 4. Diabetes Mellitus? no 5. Joint replacements in the past 12 months?no 6. Major health problems in the past 3 months?no 7. Any artificial heart valves, MVP, or defibrillator?no    MEDICATIONS & ALLERGIES:    Patient reports the following regarding taking any anticoagulation/antiplatelet therapy:   Plavix, Coumadin, Eliquis, Xarelto, Lovenox, Pradaxa, Brilinta, or Effient? no Aspirin? yes (81 mg daily)  Patient confirms/reports the following medications:  Current Outpatient Medications  Medication Sig Dispense Refill   allopurinol (ZYLOPRIM) 100 MG tablet TAKE 1 TABLET BY MOUTH EVERY DAY 90 tablet 1   amLODipine-benazepril (LOTREL) 10-40 MG capsule Take 1 capsule by mouth daily. 90 capsule 1   aspirin EC 81 MG tablet Take 81 mg by mouth daily.     bisoprolol (ZEBETA) 10 MG tablet Take 1 tablet (10 mg total) by mouth daily. 90 tablet 0   chlorthalidone (HYGROTON) 50 MG tablet Take 1 tablet (50 mg total) by mouth 2 (two) times daily. 180 tablet 2   Cholecalciferol (VITAMIN D-3) 25 MCG (1000 UT) CAPS Take 1 capsule (1,000 Units total) by mouth daily. 90 capsule 1   eplerenone (INSPRA) 25 MG tablet Take 25 mg by mouth daily.     ezetimibe (ZETIA) 10 MG tablet Take 1 tablet (10 mg total) by mouth daily. 90 tablet 2   hydrALAZINE (APRESOLINE) 100 MG tablet Take 1 tablet (100 mg total) by mouth 2 (two) times daily. 180 tablet 3   Omega-3 Fatty Acids (FISH OIL) 1000 MG CAPS Take 1,000 mg by mouth daily.     polyethylene glycol (GOLYTELY) 236 g solution Take 4,000 mLs by  mouth once for 1 dose. 4000 mL 0   No current facility-administered medications for this visit.    Patient confirms/reports the following allergies:  Allergies  Allergen Reactions   Atorvastatin Rash    No orders of the defined types were placed in this encounter.   AUTHORIZATION INFORMATION Primary Insurance: 1D#: Group #:  Secondary Insurance: 1D#: Group #:  SCHEDULE INFORMATION: Date: 12/24/23 Time: Location: [facility]

## 2023-11-19 NOTE — Telephone Encounter (Signed)
 New instructions for prep change from Suprep to Golytely. Thanks, [place], [place]

## 2023-12-01 DIAGNOSIS — H25013 Cortical age-related cataract, bilateral: Secondary | ICD-10-CM | POA: Diagnosis not present

## 2023-12-01 DIAGNOSIS — H35033 Hypertensive retinopathy, bilateral: Secondary | ICD-10-CM | POA: Diagnosis not present

## 2023-12-01 DIAGNOSIS — H2513 Age-related nuclear cataract, bilateral: Secondary | ICD-10-CM | POA: Diagnosis not present

## 2023-12-01 LAB — HM DIABETES EYE EXAM

## 2023-12-02 ENCOUNTER — Encounter: Payer: Self-pay | Admitting: Family Medicine

## 2023-12-16 ENCOUNTER — Other Ambulatory Visit: Payer: Self-pay | Admitting: Family Medicine

## 2023-12-16 DIAGNOSIS — I10 Essential (primary) hypertension: Secondary | ICD-10-CM

## 2023-12-21 ENCOUNTER — Other Ambulatory Visit: Payer: Self-pay | Admitting: Family Medicine

## 2023-12-21 DIAGNOSIS — I10 Essential (primary) hypertension: Secondary | ICD-10-CM

## 2023-12-21 NOTE — Telephone Encounter (Signed)
 Copied from CRM [id number]. Topic: Clinical - Medication Refill >> Dec 21, 2023  9:36 AM Alessandra Bevels wrote: Most Recent Primary Care Visit:  Provider: Danelle Berry  Department: [facility]  Visit Type: PHYSICAL  Date: 11/13/2023  Medication: chlorthalidone (HYGROTON) 50 MG tablet [[id number]]  Has the patient contacted their pharmacy? Yes (Agent: If no, request that the patient contact the pharmacy for the refill. If patient does not wish to contact the pharmacy document the reason why and proceed with request.) (Agent: If yes, when and what did the pharmacy advise?)  Is this the correct pharmacy for this prescription? Yes If no, delete pharmacy and type the correct one.  This is the patient's preferred pharmacy:    [facility] - [place], [place] - [address] [facility] AT Portal to Registered Caremark Sites [address] [place] [address] Phone: [phone] Fax: [phone]   Has the prescription been filled recently? Yes  Is the patient out of the medication? Yes  Has the patient been seen for an appointment in the last year OR does the patient have an upcoming appointment? Yes  Can we respond through MyChart? Yes  Agent: Please be advised that Rx refills may take up to 3 business days. We ask that you follow-up with your pharmacy.

## 2023-12-22 NOTE — Telephone Encounter (Signed)
 Too soon for refill, last refill 11/10/23 for 90 and 2 refills. Last refilled by another provider not at this practice  Requested Prescriptions  Pending Prescriptions Disp Refills   chlorthalidone (HYGROTON) 50 MG tablet 180 tablet 2    Sig: Take 1 tablet (50 mg total) by mouth 2 (two) times daily.     Cardiovascular: Diuretics - Thiazide Failed - 12/22/2023 11:33 AM      Failed - Cr in normal range and within 180 days    Creat  Date Value Ref Range Status  11/13/2023 1.39 (H) 0.70 - 1.28 mg/dL Final   Creatinine, Urine  Date Value Ref Range Status  07/29/2017 88 20 - 320 mg/dL Final         Passed - K in normal range and within 180 days    Potassium  Date Value Ref Range Status  11/13/2023 4.4 3.5 - 5.3 mmol/L Final         Passed - Na in normal range and within 180 days    Sodium  Date Value Ref Range Status  11/13/2023 139 135 - 146 mmol/L Final  11/14/2021 141 134 - 144 mmol/L Final         Passed - Last BP in normal range    BP Readings from Last 1 Encounters:  11/13/23 136/70         Passed - Valid encounter within last 6 months    Recent Outpatient Visits           1 month ago Well adult exam   [facility] Danelle Berry, PA-C       Future Appointments             In 4 months Danelle Berry, PA-C [facility]   In 5 months Richardo Hanks, Laurette Schimke, MD [facility] [place]

## 2023-12-23 ENCOUNTER — Other Ambulatory Visit: Payer: Self-pay | Admitting: Family Medicine

## 2023-12-23 DIAGNOSIS — I10 Essential (primary) hypertension: Secondary | ICD-10-CM

## 2023-12-24 ENCOUNTER — Encounter: Payer: Self-pay | Admitting: Gastroenterology

## 2023-12-24 ENCOUNTER — Ambulatory Visit: Admitting: Certified Registered"

## 2023-12-24 ENCOUNTER — Other Ambulatory Visit: Payer: Self-pay

## 2023-12-24 ENCOUNTER — Ambulatory Visit
Admission: RE | Admit: 2023-12-24 | Discharge: 2023-12-24 | Disposition: A | Discharge status: Home / Self Care | Attending: Gastroenterology | Admitting: Gastroenterology

## 2023-12-24 ENCOUNTER — Encounter
Admission: RE | Disposition: A | Discharge status: Home / Self Care | Payer: Self-pay | Source: Home / Self Care | Attending: Gastroenterology

## 2023-12-24 DIAGNOSIS — Z860100 Personal history of colon polyps, unspecified: Secondary | ICD-10-CM | POA: Diagnosis not present

## 2023-12-24 DIAGNOSIS — I129 Hypertensive chronic kidney disease with stage 1 through stage 4 chronic kidney disease, or unspecified chronic kidney disease: Secondary | ICD-10-CM | POA: Diagnosis not present

## 2023-12-24 DIAGNOSIS — Z1211 Encounter for screening for malignant neoplasm of colon: Secondary | ICD-10-CM | POA: Diagnosis not present

## 2023-12-24 DIAGNOSIS — K5140 Inflammatory polyps of colon without complications: Secondary | ICD-10-CM | POA: Insufficient documentation

## 2023-12-24 DIAGNOSIS — G4730 Sleep apnea, unspecified: Secondary | ICD-10-CM | POA: Diagnosis not present

## 2023-12-24 DIAGNOSIS — K56699 Other intestinal obstruction unspecified as to partial versus complete obstruction: Secondary | ICD-10-CM

## 2023-12-24 DIAGNOSIS — K5730 Diverticulosis of large intestine without perforation or abscess without bleeding: Secondary | ICD-10-CM | POA: Insufficient documentation

## 2023-12-24 DIAGNOSIS — E66813 Obesity, class 3: Secondary | ICD-10-CM | POA: Diagnosis not present

## 2023-12-24 DIAGNOSIS — Z6835 Body mass index (BMI) 35.0-35.9, adult: Secondary | ICD-10-CM | POA: Insufficient documentation

## 2023-12-24 DIAGNOSIS — I10 Essential (primary) hypertension: Secondary | ICD-10-CM | POA: Insufficient documentation

## 2023-12-24 DIAGNOSIS — K56609 Unspecified intestinal obstruction, unspecified as to partial versus complete obstruction: Secondary | ICD-10-CM | POA: Diagnosis not present

## 2023-12-24 DIAGNOSIS — J449 Chronic obstructive pulmonary disease, unspecified: Secondary | ICD-10-CM | POA: Insufficient documentation

## 2023-12-24 DIAGNOSIS — I739 Peripheral vascular disease, unspecified: Secondary | ICD-10-CM | POA: Diagnosis not present

## 2023-12-24 DIAGNOSIS — Z87891 Personal history of nicotine dependence: Secondary | ICD-10-CM | POA: Diagnosis not present

## 2023-12-24 DIAGNOSIS — N183 Chronic kidney disease, stage 3 unspecified: Secondary | ICD-10-CM | POA: Diagnosis not present

## 2023-12-24 DIAGNOSIS — K635 Polyp of colon: Secondary | ICD-10-CM | POA: Diagnosis not present

## 2023-12-24 DIAGNOSIS — E1122 Type 2 diabetes mellitus with diabetic chronic kidney disease: Secondary | ICD-10-CM | POA: Diagnosis not present

## 2023-12-24 DIAGNOSIS — I70213 Atherosclerosis of native arteries of extremities with intermittent claudication, bilateral legs: Secondary | ICD-10-CM | POA: Diagnosis not present

## 2023-12-24 HISTORY — PX: COLONOSCOPY: SHX5424

## 2023-12-24 HISTORY — PX: POLYPECTOMY: SHX149

## 2023-12-24 SURGERY — COLONOSCOPY
Anesthesia: General

## 2023-12-24 MED ORDER — PHENYLEPHRINE 80 MCG/ML (10ML) SYRINGE FOR IV PUSH (FOR BLOOD PRESSURE SUPPORT)
PREFILLED_SYRINGE | INTRAVENOUS | Status: DC | PRN
  Administered 2023-12-24: 80 ug via INTRAVENOUS
  Administered 2023-12-24: 160 ug via INTRAVENOUS
  Administered 2023-12-24: 80 ug via INTRAVENOUS
  Administered 2023-12-24: 160 ug via INTRAVENOUS
  Administered 2023-12-24: 80 ug via INTRAVENOUS

## 2023-12-24 MED ORDER — PROPOFOL 10 MG/ML IV BOLUS
INTRAVENOUS | Status: AC
  Filled 2023-12-24: qty 40

## 2023-12-24 MED ORDER — PROPOFOL 500 MG/50ML IV EMUL
INTRAVENOUS | Status: DC | PRN
  Administered 2023-12-24: 120 mg via INTRAVENOUS
  Administered 2023-12-24: 110 ug/kg/min via INTRAVENOUS

## 2023-12-24 MED ORDER — SODIUM CHLORIDE 0.9 % IV SOLN: INTRAVENOUS | Status: DC

## 2023-12-24 MED ORDER — EPHEDRINE SULFATE-NACL 50-0.9 MG/10ML-% IV SOSY
PREFILLED_SYRINGE | INTRAVENOUS | Status: DC | PRN
  Administered 2023-12-24 (×3): 5 mg via INTRAVENOUS

## 2023-12-24 MED ORDER — PHENYLEPHRINE HCL-NACL 20-0.9 MG/250ML-% IV SOLN
INTRAVENOUS | Status: AC
  Filled 2023-12-24: qty 250

## 2023-12-24 MED ORDER — LIDOCAINE HCL (PF) 2 % IJ SOLN
INTRAMUSCULAR | Status: AC
  Filled 2023-12-24: qty 5

## 2023-12-24 MED ORDER — LIDOCAINE HCL (PF) 2 % IJ SOLN
INTRAMUSCULAR | Status: DC | PRN
  Administered 2023-12-24: 100 mg via INTRADERMAL

## 2023-12-24 NOTE — Anesthesia Postprocedure Evaluation (Signed)
 Anesthesia Post Note  Patient: Todd Hutchinson  Procedure(s) Performed: COLONOSCOPY Balloon dilation wire-guided POLYPECTOMY, INTESTINE  Patient location during evaluation: PACU Anesthesia Type: General Level of consciousness: awake Pain management: satisfactory to patient Vital Signs Assessment: post-procedure vital signs reviewed and stable Respiratory status: spontaneous breathing and nonlabored ventilation Cardiovascular status: stable Anesthetic complications: no   No notable events documented.   Last Vitals:  Vitals:   12/24/23 1100 12/24/23 1110  BP: (!) 109/50 121/67  Pulse: 88   Resp: 17 20  Temp: (!) 36.1 C   SpO2: 96% 96%    Last Pain:  Vitals:   12/24/23 1110  TempSrc:   PainSc: 0-No pain                 VAN STAVEREN,Lacoya Wilbanks

## 2023-12-24 NOTE — Telephone Encounter (Signed)
 Requested medication (s) are due for refill today:   Requested medication (s) are on the active medication list: Yes  Last refill:  11/12/23  Future visit scheduled: Yes  Notes to clinic:  Last filled by different provider.    Requested Prescriptions  Pending Prescriptions Disp Refills   chlorthalidone (HYGROTON) 50 MG tablet [Pharmacy Med Name: CHLORTHALID  TAB 50MG ] 90 tablet 1    Sig: TAKE 1 TABLET DAILY (DOSE  INCREASE)     Cardiovascular: Diuretics - Thiazide Failed - 12/24/2023  1:24 PM      Failed - Cr in normal range and within 180 days    Creat  Date Value Ref Range Status  11/13/2023 1.39 (H) 0.70 - 1.28 mg/dL Final   Creatinine, Urine  Date Value Ref Range Status  07/29/2017 88 20 - 320 mg/dL Final         Passed - K in normal range and within 180 days    Potassium  Date Value Ref Range Status  11/13/2023 4.4 3.5 - 5.3 mmol/L Final         Passed - Na in normal range and within 180 days    Sodium  Date Value Ref Range Status  11/13/2023 139 135 - 146 mmol/L Final  11/14/2021 141 134 - 144 mmol/L Final         Passed - Last BP in normal range    BP Readings from Last 1 Encounters:  12/24/23 121/67         Passed - Valid encounter within last 6 months    Recent Outpatient Visits           1 month ago Well adult exam   [facility] Danelle Berry, PA-C       Future Appointments             In 4 months Danelle Berry, PA-C [facility]   In 5 months Richardo Hanks, Laurette Schimke, MD [facility] [place]

## 2023-12-24 NOTE — Anesthesia Preprocedure Evaluation (Signed)
 Anesthesia Evaluation  Patient identified by MRN, date of birth, ID band Patient awake    Reviewed: Allergy & Precautions, NPO status , Patient's Chart, lab work & pertinent test results  Airway Mallampati: II  TM Distance: >3 FB Neck ROM: full    Dental  (+) Teeth Intact   Pulmonary neg pulmonary ROS, sleep apnea and Continuous Positive Airway Pressure Ventilation , COPD, former smoker   Pulmonary exam normal  + decreased breath sounds      Cardiovascular Exercise Tolerance: Poor hypertension, Pt. on medications + Peripheral Vascular Disease  negative cardio ROS Normal cardiovascular exam Rhythm:Regular Rate:Normal     Neuro/Psych negative neurological ROS  negative psych ROS   GI/Hepatic negative GI ROS, Neg liver ROS,,,  Endo/Other  negative endocrine ROS  Class 3 obesity  Renal/GU negative Renal ROS  negative genitourinary   Musculoskeletal   Abdominal  (+) + obese  Peds negative pediatric ROS (+)  Hematology negative hematology ROS (+)   Anesthesia Other Findings Past Medical History: 03/13/2016: Abnormal weight gain No date: Arthritis No date: COPD (chronic obstructive pulmonary disease) (HCC) 05/31/2012: Hx of malignant neoplasm of prostate 04/17/2016: Hx of tobacco use, presenting hazards to health 03/13/2016: Hypercholesterolemia No date: Hyperlipidemia No date: Hypertension 01/27/2018: Hypertensive nephrosclerosis No date: Prostate cancer (HCC) No date: Sleep apnea     Comment:  uses CPAP  Past Surgical History: 07/29/2018: COLONOSCOPY WITH PROPOFOL; N/A     Comment:  Procedure: COLONOSCOPY WITH BIOPSIES;  Surgeon: Midge Minium, MD;  Location: [facility];  Service:               Endoscopy;  Laterality: N/A; 07/29/2018: POLYPECTOMY; N/A     Comment:  Procedure: POLYPECTOMY;  Surgeon: Midge Minium, MD;                Location: [facility];  Service: Endoscopy;                 Laterality: N/A; No date: PROSTATE SURGERY     Reproductive/Obstetrics negative OB ROS                              Anesthesia Physical Anesthesia Plan  ASA: 3  Anesthesia Plan: General   Post-op Pain Management:    Induction: Intravenous  PONV Risk Score and Plan: Propofol infusion and TIVA  Airway Management Planned: Natural Airway and Nasal Cannula  Additional Equipment:   Intra-op Plan:   Post-operative Plan:   Informed Consent: I have reviewed the patients History and Physical, chart, labs and discussed the procedure including the risks, benefits and alternatives for the proposed anesthesia with the patient or authorized representative who has indicated his/her understanding and acceptance.     Dental Advisory Given  Plan Discussed with: CRNA  Anesthesia Plan Comments:          Anesthesia Quick Evaluation

## 2023-12-24 NOTE — H&P (Signed)
 Midge Minium, MD [facility] [address] [place], [place] [address] Phone:[phone] Fax : [phone]  Primary Care Physician:  Danelle Berry, PA-C Primary Gastroenterologist:  Dr. Servando Snare  Pre-Procedure History & Physical: HPI:  Todd Hutchinson is a 73 y.o. male is here for an colonoscopy.   Past Medical History:  Diagnosis Date   Abnormal weight gain 03/13/2016   Arthritis    COPD (chronic obstructive pulmonary disease) (HCC)    Hx of malignant neoplasm of prostate 05/31/2012   Hx of tobacco use, presenting hazards to health 04/17/2016   Hypercholesterolemia 03/13/2016   Hyperlipidemia    Hypertension    Hypertensive nephrosclerosis 01/27/2018   Prostate cancer (HCC)    Sleep apnea    uses CPAP    Past Surgical History:  Procedure Laterality Date   COLONOSCOPY WITH PROPOFOL N/A 07/29/2018   Procedure: COLONOSCOPY WITH BIOPSIES;  Surgeon: Midge Minium, MD;  Location: [facility];  Service: Endoscopy;  Laterality: N/A;   POLYPECTOMY N/A 07/29/2018   Procedure: POLYPECTOMY;  Surgeon: Midge Minium, MD;  Location: [facility];  Service: Endoscopy;  Laterality: N/A;   PROSTATE SURGERY      Prior to Admission medications   Medication Sig Start Date End Date Taking? Authorizing Provider  aspirin EC 81 MG tablet Take 81 mg by mouth daily.   Yes [provider]  Cholecalciferol (VITAMIN D-3) 25 MCG (1000 UT) CAPS Take 1 capsule (1,000 Units total) by mouth daily. 06/01/23  Yes Danelle Berry, PA-C  Omega-3 Fatty Acids (FISH OIL) 1000 MG CAPS Take 1,000 mg by mouth daily.   Yes [provider]  allopurinol (ZYLOPRIM) 100 MG tablet TAKE 1 TABLET BY MOUTH EVERY DAY 10/19/23   Danelle Berry, PA-C  amLODipine-benazepril (LOTREL) 10-40 MG capsule Take 1 capsule by mouth daily. 06/01/23   Danelle Berry, PA-C  bisoprolol (ZEBETA) 10 MG tablet Take 1 tablet (10 mg total) by mouth daily. 10/16/23   Debbe Odea, MD  chlorthalidone (HYGROTON) 50  MG tablet Take 1 tablet (50 mg total) by mouth 2 (two) times daily. 11/12/23   Debbe Odea, MD  eplerenone (INSPRA) 25 MG tablet Take 25 mg by mouth daily.    [provider]  ezetimibe (ZETIA) 10 MG tablet Take 1 tablet (10 mg total) by mouth daily. 10/19/23 10/13/24  Debbe Odea, MD  hydrALAZINE (APRESOLINE) 100 MG tablet Take 1 tablet (100 mg total) by mouth 2 (two) times daily. 08/25/23 08/19/24  Debbe Odea, MD    Allergies as of 11/19/2023 - Review Complete 11/19/2023  Allergen Reaction Noted   Atorvastatin Rash 04/10/2015    Family History  Problem Relation Age of Onset   Hypertension Mother    Aneurysm Mother 78       brain   Hypertension Father    Heart attack Father    Hypertension Sister    Hypertension Brother    Heart failure Brother    Heart disease Paternal Grandfather    Hypertension Sister    Hypertension Brother    Cancer Brother        throat cancer   Kidney cancer Neg Hx    Kidney failure Neg Hx    Prostate cancer Neg Hx    Sickle cell anemia Neg Hx    Tuberculosis Neg Hx     Social History   Socioeconomic History   Marital status: Married    Spouse name: Elveria Rising    Number of children: 1   Years of education: Not on  file   Highest education level: 12th grade  Occupational History    Comment: Retired   Tobacco Use   Smoking status: Former    Current packs/day: 0.00    Average packs/day: 1 pack/day for 40.0 years (40.0 ttl pk-yrs)    Types: Cigarettes    Start date: 11/1973    Quit date: 11/2013    Years since quitting: 10.1   Smokeless tobacco: Never  Vaping Use   Vaping status: Never Used  Substance and Sexual Activity   Alcohol use: Yes    Alcohol/week: 16.0 standard drinks of alcohol    Types: 16 Shots of liquor per week    Comment: no more than 4 whiskey drinks a day; maybe 4 times day    Drug use: No   Sexual activity: Not Currently    Partners: Female  Other Topics Concern   Not on file  Social History  Narrative   Not on file   Social Drivers of Health   Financial Resource Strain: Low Risk  (11/10/2023)   Overall Financial Resource Strain (CARDIA)    Difficulty of Paying Living Expenses: Not hard at all  Food Insecurity: No Food Insecurity (11/10/2023)   Hunger Vital Sign    Worried About Running Out of Food in the Last Year: Never true    Ran Out of Food in the Last Year: Never true  Transportation Needs: No Transportation Needs (11/10/2023)   PRAPARE - Administrator, Civil Service (Medical): No    Lack of Transportation (Non-Medical): No  Physical Activity: Inactive (11/13/2023)   Exercise Vital Sign    Days of Exercise per Week: 0 days    Minutes of Exercise per Session: 0 min  Stress: No Stress Concern Present (11/10/2023)   [employer] of Occupational Health - Occupational Stress Questionnaire    Feeling of Stress : Not at all  Social Connections: Socially Integrated (11/10/2023)   Social Connection and Isolation Panel [NHANES]    Frequency of Communication with Friends and Family: More than three times a week    Frequency of Social Gatherings with Friends and Family: Once a week    Attends Religious Services: More than 4 times per year    Active Member of [employer] or Organizations: Yes    Attends Engineer, structural: More than 4 times per year    Marital Status: Married  Catering manager Violence: Not At Risk (11/13/2023)   Humiliation, Afraid, Rape, and Kick questionnaire    Fear of Current or Ex-Partner: No    Emotionally Abused: No    Physically Abused: No    Sexually Abused: No    Review of Systems: See HPI, otherwise negative ROS  Physical Exam: There were no vitals taken for this visit. General:   Alert,  pleasant and cooperative in NAD Head:  Normocephalic and atraumatic. Neck:  Supple; no masses or thyromegaly. Lungs:  Clear throughout to auscultation.    Heart:  Regular rate and rhythm. Abdomen:  Soft, nontender and nondistended.  Normal bowel sounds, without guarding, and without rebound.   Neurologic:  Alert and  oriented x4;  grossly normal neurologically.  Impression/Plan: Todd Hutchinson is here for an colonoscopy to be performed for a history of adenomatous polyps on 2019   Risks, benefits, limitations, and alternatives regarding  colonoscopy have been reviewed with the patient.  Questions have been answered.  All parties agreeable.   Midge Minium, MD  12/24/2023, 10:16 AM

## 2023-12-24 NOTE — Transfer of Care (Signed)
 Immediate Anesthesia Transfer of Care Note  Patient: Todd Hutchinson  Procedure(s) Performed: COLONOSCOPY Balloon dilation wire-guided POLYPECTOMY, INTESTINE  Patient Location: Endoscopy Unit  Anesthesia Type:General  Level of Consciousness: awake, alert , and oriented  Airway & Oxygen Therapy: Patient Spontanous Breathing  Post-op Assessment: Report given to RN and Post -op Vital signs reviewed and stable  Post vital signs: Reviewed and stable  Last Vitals:  Vitals Value Taken Time  BP 109/50 12/24/23 1100  Temp 36.1 C 12/24/23 1100  Pulse 89 12/24/23 1102  Resp 20 12/24/23 1102  SpO2 96 % 12/24/23 1102  Vitals shown include unfiled device data.  Last Pain:  Vitals:   12/24/23 1100  TempSrc: Temporal  PainSc: 0-No pain         Complications: No notable events documented.

## 2023-12-24 NOTE — Op Note (Signed)
 [facility] Patient Name: Todd Hutchinson Procedure Date: 12/24/2023 10:16 AM MRN: [id number] Account #: [id number] Date of Birth: 1951-01-04 Admit Type: Outpatient Age: 73 Room: [facility] ENDO ROOM 4 Gender: Male Note Status: Finalized Instrument Name: Prentice Docker [id number] Procedure:             Colonoscopy Indications:           High risk colon cancer surveillance: Personal history                         of colonic polyps Providers:             Midge Minium MD, MD Referring MD:          Danelle Berry (Referring MD) Medicines:             Propofol per Anesthesia Complications:         No immediate complications. Procedure:             Pre-Anesthesia Assessment:                        - Prior to the procedure, a History and Physical was                         performed, and patient medications and allergies were                         reviewed. The patient's tolerance of previous                         anesthesia was also reviewed. The risks and benefits                         of the procedure and the sedation options and risks                         were discussed with the patient. All questions were                         answered, and informed consent was obtained. Prior                         Anticoagulants: The patient has taken no anticoagulant                         or antiplatelet agents. ASA Grade Assessment: II - A                         patient with mild systemic disease. After reviewing                         the risks and benefits, the patient was deemed in                         satisfactory condition to undergo the procedure.                        After obtaining informed consent, the colonoscope was  passed under direct vision. Throughout the procedure,                         the patient's blood pressure, pulse, and oxygen                         saturations were monitored continuously. The                          Colonoscope was introduced through the anus and                         advanced to the the cecum, identified by appendiceal                         orifice and ileocecal valve. The patient tolerated the                         procedure well. The quality of the bowel preparation                         was excellent. The colonoscopy was technically                         difficult and complex. Successful completion of the                         procedure was aided by withdrawing the scope and                         replacing with the pediatric colonoscope. Findings:      The perianal and digital rectal examinations were normal.      A benign-appearing, intrinsic moderate stenosis was found in the sigmoid       colon and was traversed after dilation. A TTS dilator was passed through       the scope. Dilation with a 12-13.5-15 mm balloon dilator was performed       to 15 mm. The dilation site was examined following endoscope reinsertion       and showed complete resolution of luminal narrowing.      A 3 mm polyp was found in the transverse colon. The polyp was sessile.       The polyp was removed with a cold snare. Resection and retrieval were       complete.      A 5 mm polyp was found in the sigmoid colon. The polyp was sessile. The       polyp was removed with a cold snare. Resection and retrieval were       complete.      Multiple small-mouthed diverticula were found in the sigmoid colon. Impression:            - Stricture in the sigmoid colon. Dilated.                        - One 3 mm polyp in the transverse colon, removed with                         a cold snare. Resected and retrieved.                        -  One 5 mm polyp in the sigmoid colon, removed with a                         cold snare. Resected and retrieved.                        - Diverticulosis in the sigmoid colon. Recommendation:        - Discharge patient to home.                        - Resume  previous diet.                        - Continue present medications.                        - Await pathology results.                        - Repeat colonoscopy is not recommended for                         surveillance. Procedure Code(s):     --- Professional ---                        [phone], Colonoscopy, flexible; with removal of                         tumor(s), polyp(s), or other lesion(s) by snare                         technique                        [phone], Colonoscopy, flexible; with transendoscopic                         balloon dilation Diagnosis Code(s):     --- Professional ---                        Z86.010, Personal history of colonic polyps                        D12.3, Benign neoplasm of transverse colon (hepatic                         flexure or splenic flexure)                        D12.5, Benign neoplasm of sigmoid colon                        K56.699, Other intestinal obstruction unspecified as                         to partial versus complete obstruction CPT copyright 2022 American Medical Association. All rights reserved. The codes documented in this report are preliminary and upon coder review may  be revised to meet current compliance requirements. Midge Minium MD, MD 12/24/2023 10:59:40 AM This report has been signed electronically. Number of Addenda: 0 Note Initiated On:  12/24/2023 10:16 AM Scope Withdrawal Time: 0 hours 6 minutes 14 seconds  Total Procedure Duration: 0 hours 33 minutes 33 seconds  Estimated Blood Loss:  Estimated blood loss: none.      [facility]

## 2023-12-25 ENCOUNTER — Encounter: Payer: Self-pay | Admitting: Gastroenterology

## 2023-12-28 ENCOUNTER — Other Ambulatory Visit: Payer: Self-pay | Admitting: Family Medicine

## 2023-12-28 DIAGNOSIS — I10 Essential (primary) hypertension: Secondary | ICD-10-CM

## 2023-12-29 LAB — SURGICAL PATHOLOGY

## 2023-12-29 NOTE — Telephone Encounter (Signed)
 Requested medications are due for refill today.  no  Requested medications are on the active medications list.  yes  Last refill. 11/12/2023 #180 2 rf  Future visit scheduled.   yes  Notes to clinic.  Rx signed by a different provider. Refill not due.    Requested Prescriptions  Pending Prescriptions Disp Refills   chlorthalidone (HYGROTON) 50 MG tablet [Pharmacy Med Name: CHLORTHALID  TAB 50MG ] 90 tablet 1    Sig: TAKE 1 TABLET DAILY (DOSE  INCREASE)     Cardiovascular: Diuretics - Thiazide Failed - 12/29/2023 12:08 PM      Failed - Cr in normal range and within 180 days    Creat  Date Value Ref Range Status  11/13/2023 1.39 (H) 0.70 - 1.28 mg/dL Final   Creatinine, Urine  Date Value Ref Range Status  07/29/2017 88 20 - 320 mg/dL Final         Passed - K in normal range and within 180 days    Potassium  Date Value Ref Range Status  11/13/2023 4.4 3.5 - 5.3 mmol/L Final         Passed - Na in normal range and within 180 days    Sodium  Date Value Ref Range Status  11/13/2023 139 135 - 146 mmol/L Final  11/14/2021 141 134 - 144 mmol/L Final         Passed - Last BP in normal range    BP Readings from Last 1 Encounters:  12/24/23 121/67         Passed - Valid encounter within last 6 months    Recent Outpatient Visits           1 month ago Well adult exam   [facility] Adeline Hone, PA-C       Future Appointments             In 4 months Adeline Hone, PA-C [facility]   In 5 months Estanislao Heimlich, Dennard Fisher, MD [facility] [place]

## 2023-12-30 ENCOUNTER — Encounter: Payer: Self-pay | Admitting: Gastroenterology

## 2024-01-14 ENCOUNTER — Other Ambulatory Visit: Payer: Self-pay

## 2024-01-14 MED ORDER — BISOPROLOL FUMARATE 10 MG PO TABS: 10.0000 mg | ORAL_TABLET | Freq: Every day | ORAL | 0 refills | Status: DC

## 2024-01-25 DIAGNOSIS — R319 Hematuria, unspecified: Secondary | ICD-10-CM | POA: Diagnosis not present

## 2024-01-25 DIAGNOSIS — I10 Essential (primary) hypertension: Secondary | ICD-10-CM | POA: Diagnosis not present

## 2024-01-25 DIAGNOSIS — G4733 Obstructive sleep apnea (adult) (pediatric): Secondary | ICD-10-CM | POA: Diagnosis not present

## 2024-01-25 DIAGNOSIS — N1831 Chronic kidney disease, stage 3a: Secondary | ICD-10-CM | POA: Diagnosis not present

## 2024-01-28 DIAGNOSIS — D631 Anemia in chronic kidney disease: Secondary | ICD-10-CM | POA: Diagnosis not present

## 2024-01-28 DIAGNOSIS — N1831 Chronic kidney disease, stage 3a: Secondary | ICD-10-CM | POA: Diagnosis not present

## 2024-01-28 DIAGNOSIS — R319 Hematuria, unspecified: Secondary | ICD-10-CM | POA: Diagnosis not present

## 2024-01-28 DIAGNOSIS — I10 Essential (primary) hypertension: Secondary | ICD-10-CM | POA: Diagnosis not present

## 2024-02-02 ENCOUNTER — Encounter (INDEPENDENT_AMBULATORY_CARE_PROVIDER_SITE_OTHER): Payer: Self-pay

## 2024-02-25 ENCOUNTER — Encounter: Payer: Self-pay | Admitting: Urology

## 2024-02-25 ENCOUNTER — Ambulatory Visit (INDEPENDENT_AMBULATORY_CARE_PROVIDER_SITE_OTHER): Admitting: Urology

## 2024-02-25 VITALS — BP 151/68 | HR 77

## 2024-02-25 DIAGNOSIS — N393 Stress incontinence (female) (male): Secondary | ICD-10-CM

## 2024-02-25 DIAGNOSIS — R310 Gross hematuria: Secondary | ICD-10-CM | POA: Diagnosis not present

## 2024-02-25 DIAGNOSIS — C61 Malignant neoplasm of prostate: Secondary | ICD-10-CM | POA: Diagnosis not present

## 2024-02-25 LAB — BLADDER SCAN AMB NON-IMAGING: Scan Result: 15

## 2024-02-25 NOTE — Progress Notes (Signed)
 02/25/2024 4:36 PM   Alanson Hua September 29, 1950 [id number]  Referring provider: Adeline Hone, PA-C [address] [place],  [place] [address]  Urological history: 1.  Prostate cancer - PSA (10/2023) 0.46 - Open radical prostatectomy (2000) -Gleason's 3+4 equal 7 with negative margins and no extraprostatic extension - Salvage radiation (2006) - ADT completed (2019)   2. SUI - Kegel  Chief Complaint  Patient presents with   Follow-up     blood in urine    HPI: Rogue Rafalski is a 73 y.o. man who presents today for blood in the urine with his wife, Agnes Alderman.   Previous records reviewed.   On Monday evening, he passed a blood clot and it was followed by painless gross hematuria that gradually improved and had completely abated by Tuesday.  Patient denies any modifying or aggravating factors.  Patient denies any recent UTI's, dysuria or suprapubic/flank pain.  Patient denies any fevers, chills, nausea or vomiting.    UA yellow clear, specific gravity 1.010, pH 6.0, 1+ leukocytes, greater than 30 WBCs, 0-2 RBCs, 0-10 epithelial cells and many bacteria.  He also says that he is having worsening of stress urinary continence for the last several months.    PVR 15 mL  PMH: Past Medical History:  Diagnosis Date   Abnormal weight gain 03/13/2016   Arthritis    COPD (chronic obstructive pulmonary disease) (HCC)    Hx of malignant neoplasm of prostate 05/31/2012   Hx of tobacco use, presenting hazards to health 04/17/2016   Hypercholesterolemia 03/13/2016   Hyperlipidemia    Hypertension    Hypertensive nephrosclerosis 01/27/2018   Prostate cancer (HCC)    Sleep apnea    uses CPAP    Surgical History: Past Surgical History:  Procedure Laterality Date   COLONOSCOPY N/A 12/24/2023   Procedure: COLONOSCOPY;  Surgeon: Marnee Sink, MD;  Location: [facility] ENDOSCOPY;  Service: Endoscopy;  Laterality: N/A;   COLONOSCOPY WITH PROPOFOL  N/A 07/29/2018    Procedure: COLONOSCOPY WITH BIOPSIES;  Surgeon: Marnee Sink, MD;  Location: [facility];  Service: Endoscopy;  Laterality: N/A;   POLYPECTOMY N/A 07/29/2018   Procedure: POLYPECTOMY;  Surgeon: Marnee Sink, MD;  Location: [facility];  Service: Endoscopy;  Laterality: N/A;   POLYPECTOMY  12/24/2023   Procedure: POLYPECTOMY, INTESTINE;  Surgeon: Marnee Sink, MD;  Location: [facility] ENDOSCOPY;  Service: Endoscopy;;   PROSTATE SURGERY      Home Medications:  Allergies as of 02/25/2024       Reactions   Atorvastatin Rash        Medication List        Accurate as of February 25, 2024  4:36 PM. If you have any questions, ask your nurse or doctor.          allopurinol  100 MG tablet Commonly known as: ZYLOPRIM  TAKE 1 TABLET BY MOUTH EVERY DAY   amLODipine -benazepril  10-40 MG capsule Commonly known as: LOTREL Take 1 capsule by mouth daily.   aspirin EC 81 MG tablet Take 81 mg by mouth daily.   bisoprolol  10 MG tablet Commonly known as: ZEBETA  Take 1 tablet (10 mg total) by mouth daily. PLEASE CALL [phone] TO SCHEDULE AN APPOINTMENT PRIOR TO NEXT REFILL REQUEST. THANK YOU.   chlorthalidone  50 MG tablet Commonly known as: HYGROTON  Take 1 tablet (50 mg total) by mouth 2 (two) times daily.   eplerenone 25 MG tablet Commonly known as: INSPRA Take 25 mg by mouth daily.   ezetimibe  10 MG  tablet Commonly known as: ZETIA  Take 1 tablet (10 mg total) by mouth daily.   Fish Oil 1000 MG Caps Take 1,000 mg by mouth daily.   hydrALAZINE  100 MG tablet Commonly known as: APRESOLINE  Take 1 tablet (100 mg total) by mouth 2 (two) times daily.   Vitamin D -3 25 MCG (1000 UT) Caps Take 1 capsule (1,000 Units total) by mouth daily.        Allergies:  Allergies  Allergen Reactions   Atorvastatin Rash    Family History: Family History  Problem Relation Age of Onset   Hypertension Mother    Aneurysm Mother 85       brain   Hypertension Father    Heart attack  Father    Hypertension Sister    Hypertension Brother    Heart failure Brother    Heart disease Paternal Grandfather    Hypertension Sister    Hypertension Brother    Cancer Brother        throat cancer   Kidney cancer Neg Hx    Kidney failure Neg Hx    Prostate cancer Neg Hx    Sickle cell anemia Neg Hx    Tuberculosis Neg Hx     Social History:  reports that he quit smoking about 10 years ago. His smoking use included cigarettes. He started smoking about 50 years ago. He has a 40 pack-year smoking history. He has never used smokeless tobacco. He reports current alcohol use of about 16.0 standard drinks of alcohol per week. He reports that he does not use drugs.  ROS: Pertinent ROS in HPI  Physical Exam: BP (!) 151/68   Pulse 77   Constitutional:  Well nourished. Alert and oriented, No acute distress. HEENT: [place] AT, moist mucus membranes.  Trachea midline Cardiovascular: No clubbing, cyanosis, or edema. Respiratory: Normal respiratory effort, no increased work of breathing. Neurologic: Grossly intact, no focal deficits, moving all 4 extremities. Psychiatric: Normal mood and affect.  Laboratory Data: Lab Results  Component Value Date   WBC 9.9 11/13/2023   HGB 11.4 (L) 11/13/2023   HCT 35.7 (L) 11/13/2023   MCV 86.7 11/13/2023   PLT 263 11/13/2023    Lab Results  Component Value Date   CREATININE 1.39 (H) 11/13/2023    Lab Results  Component Value Date   PSA 0.46 11/13/2023    Lab Results  Component Value Date   HGBA1C 5.3 11/13/2023       Component Value Date/Time   CHOL 170 11/13/2023 1227   CHOL 139 04/18/2015 0823   HDL 78 11/13/2023 1227   HDL 46 04/18/2015 0823   CHOLHDL 2.2 11/13/2023 1227   VLDL 46 (H) 04/15/2017 0921   LDLCALC 75 11/13/2023 1227    Lab Results  Component Value Date   AST 11 11/13/2023   Lab Results  Component Value Date   ALT 12 11/13/2023   Urinalysis See EPIC and HPI  I have reviewed the labs.   Pertinent  Imaging:  02/25/24 15:50  Scan Result 15 ml   Assessment & Plan:    1. Gross hematuria  - we discussed that there are a number of causes that can be associated with blood in the urine, such as stones, radiation cystitis, UTI's, damage to the urinary tract and/or cancer.   Sometimes, we do not find a cause or source of the hematuria.   -we discussed that new guidelines place individuals into risk categories of low, intermediate and high risk categories.  These factors  are based on age, smoking history and degree of blood in urine.   -we discussed that the recommended protocol for further work up are are CT urogram and cysto -we discussed that for a CT urogram a contrast material will be injected into a vein and that in rare instances, an allergic reaction can result and may even life threatening (1:100,000)  The patient denies any allergies to contrast, iodine and/or seafood and is not taking metformin. -we discussed that a cystoscopy is a procedure that consists of passing a camera up their urethra after administering lidocaine  to anesthetize and that after the procedure a minor amount of blood in the urine and/or burning which usually resolves in 24 to 48 hours may occur  -the patient had the opportunity to ask questions which were answered. Based upon this discussion, the patient is willing to proceed. Therefore, I've ordered: a CT Urogram and cystoscopy  - The patient will return following all of the above for discussion of the results.  - BLADDER SCAN AMB NON-IMAGING - CULTURE, URINE COMPREHENSIVE - Urinalysis, Complete  2. SUI - Will discuss further after he completes hematuria workup  3. Prostate cancer - Followed by Dr. Estanislao Heimlich has follow up in September   Return for CT Urogram report and cystoscopy for gross heme .  These notes generated with voice recognition software. I apologize for typographical errors.  Briant Camper  [facility] [address]  [address] [place], [place] [address] [phone]

## 2024-02-25 NOTE — Patient Instructions (Signed)

## 2024-02-26 LAB — MICROSCOPIC EXAMINATION: WBC, UA: >30 /HPF — AB (ref 0–5)

## 2024-02-26 LAB — URINALYSIS, COMPLETE
Bilirubin, UA: NEGATIVE
Glucose, UA: NEGATIVE
Ketones, UA: NEGATIVE
Nitrite, UA: NEGATIVE
Protein,UA: NEGATIVE
RBC, UA: NEGATIVE
Specific Gravity, UA: 1.01 (ref 1.005–1.030)
Urobilinogen, Ur: 0.2 mg/dL (ref 0.2–1.0)
pH, UA: 6 (ref 5.0–7.5)

## 2024-03-01 LAB — CULTURE, URINE COMPREHENSIVE

## 2024-03-03 ENCOUNTER — Ambulatory Visit: Payer: Self-pay | Admitting: Physician Assistant

## 2024-03-03 ENCOUNTER — Ambulatory Visit (INDEPENDENT_AMBULATORY_CARE_PROVIDER_SITE_OTHER): Admitting: Urology

## 2024-03-03 VITALS — BP 155/65 | HR 69 | Ht 67.0 in | Wt 236.0 lb

## 2024-03-03 DIAGNOSIS — N3001 Acute cystitis with hematuria: Secondary | ICD-10-CM

## 2024-03-03 MED ORDER — CEPHALEXIN 500 MG PO CAPS: 500.0000 mg | ORAL_CAPSULE | Freq: Two times a day (BID) | ORAL | 0 refills | Status: DC

## 2024-03-03 NOTE — Progress Notes (Signed)
   03/03/2024 3:54 PM   Todd Hutchinson 16-Feb-1951 [id number]  Reason for visit: Hematuria, UTI  HPI: 73 year old male previously followed by Dr. Aram Knights, history of open radical prostatectomy in 2000 with favorable intermediate risk disease, underwent salvage radiation in 2006 for reported biochemical recurrence.  Had been on long-term Lupron  ADT, however PSA was undetectable and in January 2020 we stopped ADT and PSA has remained very low since that time.  He was scheduled for cystoscopy today by Todd Son, PA.  CT has not been performed yet.  He had an episode of gross hematuria and some worsening of his baseline stress incontinence, urine culture did grow E. coli.  I think Todd Hutchinson was out of the office this week and did not see that culture.  I recommended treating his UTI with antibiotics prior to considering cystoscopy or CT.  He is in agreement.  Keflex 500 mg twice daily x 10 days for UTI RTC 6 to 8 weeks repeat urinalysis  Todd Pressman, MD  [facility] [address], [address] [place], [place] [address] [phone]

## 2024-03-03 NOTE — Patient Instructions (Signed)
 Please call [phone] to cancel your CT scan

## 2024-03-08 ENCOUNTER — Ambulatory Visit: Admission: RE | Admit: 2024-03-08 | Source: Ambulatory Visit

## 2024-03-16 ENCOUNTER — Ambulatory Visit: Attending: Medical | Admitting: Medical

## 2024-03-16 ENCOUNTER — Encounter: Payer: Self-pay | Admitting: Medical

## 2024-03-16 VITALS — BP 140/60 | HR 61

## 2024-03-16 DIAGNOSIS — I10 Essential (primary) hypertension: Secondary | ICD-10-CM | POA: Diagnosis not present

## 2024-03-16 DIAGNOSIS — E785 Hyperlipidemia, unspecified: Secondary | ICD-10-CM | POA: Diagnosis not present

## 2024-03-16 DIAGNOSIS — Z6836 Body mass index (BMI) 36.0-36.9, adult: Secondary | ICD-10-CM | POA: Diagnosis not present

## 2024-03-16 NOTE — Progress Notes (Signed)
  Cardiology Office Note   Date:  03/16/2024  ID:  Todd Hutchinson, DOB 1951/09/05, MRN [id number] PCP: Leavy Mole, PA-C  [facility] Providers Cardiologist:  Redell Cave, MD   History of Present Illness Todd Hutchinson is a 73 y.o. male with a h/o HTN, HLD, PAD, COPD, CKD stage 3, former smoker x 40 years who presents for follow-up for HTN.   The patient did not tolerate Coreg  due to abdmonal pain and diarrhea. He did not tolerate lipitor or pravachol  due to myalgias. Lower extremity US  07/2022 showed right ABI 0.7 and left ABI 0.79. renal artery US  01/2022 showed mild RAS, 1-59%, he declined renal denervation procedure.   Today, the patient is overall doing OK. He denies chest pain, SOB, lower leg edema. He has occasional dependent edema. He eats a low salt diet. He denies heart racing, palpitations, lightheadedness or dizziness. He does not formal edema.   Studies Reviewed EKG Interpretation Date/Time:  Wednesday March 16 2024 13:36:57 EDT Ventricular Rate:  61 PR Interval:  134 QRS Duration:  82 QT Interval:  426 QTC Calculation: 428 R Axis:   16  Text Interpretation: Normal sinus rhythm Low voltage QRS Nonspecific T wave abnormality When compared with ECG of 19-Aug-2023 11:16, No significant change was found Confirmed by Franchester, Ulla Mckiernan ([id number]) on 03/16/2024 1:38:03 PM       Physical Exam VS:  BP (!) 140/60 (BP Location: Left Arm, Patient Position: Sitting, Cuff Size: Large)   Pulse 61   SpO2 98%        Wt Readings from Last 3 Encounters:  03/03/24 236 lb (107 kg)  12/24/23 226 lb 3.2 oz (102.6 kg)  11/13/23 228 lb (103.4 kg)    GEN: Well nourished, well developed in no acute distress NECK: No JVD; No carotid bruits CARDIAC: RRR, no murmurs, rubs, gallops RESPIRATORY:  Clear to auscultation without rales, wheezing or rhonchi  ABDOMEN: Soft, non-tender, non-distended EXTREMITIES:  No edema; No deformity   ASSESSMENT AND PLAN  HTN BP today is  reasonable. At home it is mostly normal. He reports a low salt diet. Continue hydralazine  100mg  BID, bisoprolol  10mg  daily, chlorthalidone  25mg  daily, eplerenon 25mg  daily and Lotrel 1040mg  daily. Renal artery US  showed mild nonobstructive disease, he declined denervation. Activity/walking was advised.   HLD LDL 75. History of myalgias with pravachol . Continue Zetia .   Obesity Diet and exercise discussed.        Dispo: follow-up in 1 year  Signed, Cartina Brousseau VEAR Franchester, PA-C

## 2024-03-16 NOTE — Patient Instructions (Signed)
 Medication Instructions:   Your physician recommends that you continue on your current medications as directed. Please refer to the Current Medication list given to you today.   *If you need a refill on your cardiac medications before your next appointment, please call your pharmacy*  Lab Work:  No labs ordered today   If you have labs (blood work) drawn today and your tests are completely normal, you will receive your results only by: MyChart Message (if you have MyChart) OR A paper copy in the mail If you have any lab test that is abnormal or we need to change your treatment, we will call you to review the results.  Testing/Procedures:  No test ordered today   Follow-Up: At [facility], you and your health needs are our priority.  As part of our continuing mission to provide you with exceptional heart care, our providers are all part of one team.  This team includes your primary Cardiologist (physician) and Advanced Practice Providers or APPs (Physician Assistants and Nurse Practitioners) who all work together to provide you with the care you need, when you need it.  Your next appointment:    12 month(s)  Provider:    You may see Redell Cave, MD or one of the following Advanced Practice Providers on your designated Care Team:    Cadence Zwolle, PA-C

## 2024-04-11 ENCOUNTER — Other Ambulatory Visit: Payer: Self-pay | Admitting: Family Medicine

## 2024-04-11 ENCOUNTER — Other Ambulatory Visit: Payer: Self-pay | Admitting: Cardiology

## 2024-04-11 DIAGNOSIS — M1A0790 Idiopathic chronic gout, unspecified ankle and foot, without tophus (tophi): Secondary | ICD-10-CM

## 2024-04-12 NOTE — Telephone Encounter (Signed)
 Requested medication (s) are due for refill today: yes  Requested medication (s) are on the active medication list: yes  Last refill:  10/19/23 #90 1 RF  Future visit scheduled: yes  Notes to clinic:  overdue lab work    Requested Prescriptions  Pending Prescriptions Disp Refills   allopurinol  (ZYLOPRIM ) 100 MG tablet [Pharmacy Med Name: ALLOPURINOL  100 MG TABLET] 90 tablet 1    Sig: TAKE 1 TABLET BY MOUTH EVERY DAY     Endocrinology:  Gout Agents - allopurinol  Failed - 04/12/2024 11:46 AM      Failed - Uric Acid in normal range and within 360 days    Uric Acid, Serum  Date Value Ref Range Status  08/01/2022 7.0 4.0 - 8.0 mg/dL Final    Comment:    Therapeutic target for gout patients: <6.0 mg/dL .          Failed - Cr in normal range and within 360 days    Creat  Date Value Ref Range Status  11/13/2023 1.39 (H) 0.70 - 1.28 mg/dL Final   Creatinine, Urine  Date Value Ref Range Status  07/29/2017 88 20 - 320 mg/dL Final         Passed - Valid encounter within last 12 months    Recent Outpatient Visits           5 months ago Well adult exam   [facility] Leavy Mole, PA-C       Future Appointments             In 2 weeks Francisca Redell BROCKS, MD [facility]    In 1 month Leavy Mole, PA-C [facility]   In 2 months Francisca, Redell BROCKS, MD [facility] [place]            Passed - CBC within normal limits and completed in the last 12 months    WBC  Date Value Ref Range Status  11/13/2023 9.9 3.8 - 10.8 Thousand/uL Final   RBC  Date Value Ref Range Status  11/13/2023 4.12 (L) 4.20 - 5.80 Million/uL Final   Hemoglobin  Date Value Ref Range Status  11/13/2023 11.4 (L) 13.2 - 17.1 g/dL Final  91/96/7983 89.1 (L) 12.6 - 17.7 g/dL Final   HCT  Date Value Ref Range Status  11/13/2023 35.7 (L) 38.5 - 50.0 % Final   Hematocrit  Date Value Ref Range Status  04/18/2015 33.0 (L) 37.5  - 51.0 % Final   MCHC  Date Value Ref Range Status  11/13/2023 31.9 (L) 32.0 - 36.0 g/dL Final    Comment:    For adults, a slight decrease in the calculated MCHC value (in the range of 30 to 32 g/dL) is most likely not clinically significant; however, it should be interpreted with caution in correlation with other red cell parameters and the patient's clinical condition.    [facility]  Date Value Ref Range Status  11/13/2023 27.7 27.0 - 33.0 pg Final   MCV  Date Value Ref Range Status  11/13/2023 86.7 80.0 - 100.0 fL Final  04/18/2015 78 (L) 79 - 97 fL Final  11/03/2013 78 (L) 80 - 100 fL Final   No results found for: PLTCOUNTKUC, LABPLAT, POCPLA RDW  Date Value Ref Range Status  11/13/2023 14.1 11.0 - 15.0 % Final  04/18/2015 16.1 (H) 12.3 - 15.4 % Final  11/03/2013 18.7 (H) 11.5 - 14.5 % Final

## 2024-04-26 DIAGNOSIS — G4733 Obstructive sleep apnea (adult) (pediatric): Secondary | ICD-10-CM | POA: Diagnosis not present

## 2024-04-27 ENCOUNTER — Ambulatory Visit (INDEPENDENT_AMBULATORY_CARE_PROVIDER_SITE_OTHER): Admitting: Urology

## 2024-04-27 VITALS — BP 135/80 | HR 70 | Wt 236.0 lb

## 2024-04-27 DIAGNOSIS — R310 Gross hematuria: Secondary | ICD-10-CM

## 2024-04-27 DIAGNOSIS — R399 Unspecified symptoms and signs involving the genitourinary system: Secondary | ICD-10-CM | POA: Diagnosis not present

## 2024-04-27 LAB — URINALYSIS, COMPLETE
Bilirubin, UA: NEGATIVE
Glucose, UA: NEGATIVE
Ketones, UA: NEGATIVE
Leukocytes,UA: NEGATIVE
Nitrite, UA: NEGATIVE
Protein,UA: NEGATIVE
RBC, UA: NEGATIVE
Specific Gravity, UA: <1.005 — ABNORMAL LOW (ref 1.005–1.030)
Urobilinogen, Ur: 0.2 mg/dL (ref 0.2–1.0)
pH, UA: 6 (ref 5.0–7.5)

## 2024-04-27 LAB — MICROSCOPIC EXAMINATION
Epithelial Cells (non renal): >10 /HPF — AB (ref 0–10)
RBC, Urine: NONE SEEN /HPF (ref 0–2)

## 2024-04-27 NOTE — Progress Notes (Signed)
   04/27/2024 12:54 PM   Todd Hutchinson [birth year] [id number]  Reason for visit: Hematuria, UTI, history of prostate cancer, incontinence  HPI: 73 year old male previously followed by Dr. Ike, history of open radical prostatectomy in 2000 with favorable intermediate risk disease, underwent salvage radiation in 2006 for reported biochemical recurrence.  Had been on long-term Lupron  ADT, however PSA was undetectable and in January 2020 we stopped ADT and PSA has remained very low since that time.  Most recent PSA 0.46 from February 2025, PSA has been essentially stable ranging from 0.1-0.6 since 2020.  He has had mild stress incontinence since time of surgery.  He has been scheduled for cystoscopy and CT with me in June 2025 by Clotilda Cornwall, PA for gross hematuria, however urine culture grew E. coli and he was treated with antibiotics.  He opted to complete antibiotics and repeat a urinalysis prior to considering a cystoscopy/CT workup.  He denies any gross hematuria or urinary problems since our last visit.  Urinalysis today benign, no RBCs.  He would like to defer cystoscopy and CT which I think is reasonable.  He was interested in referral to Dr. Lovie in [place] for consideration of incontinence procedures, and information was provided.  RTC 1 year PSA prior  Todd JAYSON Burnet, MD  [facility] [address] [place], [place] [address] [phone]

## 2024-04-27 NOTE — Patient Instructions (Addendum)
 Dr Lovie is a urologist in [place] with [facility] who performs surgeries for incontinence in patients who have had prostate cancer.  His website is [email or web address].  You can request an appointment through the website, or reach out to his office   We will call with your urinalysis from today, if there is microscopic blood this would warrant further evaluation with a CT scan and cystoscopy

## 2024-05-06 ENCOUNTER — Other Ambulatory Visit: Payer: Self-pay | Admitting: Family Medicine

## 2024-05-06 DIAGNOSIS — I10 Essential (primary) hypertension: Secondary | ICD-10-CM

## 2024-05-06 NOTE — Telephone Encounter (Signed)
 Requested Prescriptions  Pending Prescriptions Disp Refills   amLODipine -benazepril  (LOTREL) 10-40 MG capsule [Pharmacy Med Name: AMLODIPINE -BENAZEPRIL  10-40 MG] 90 capsule 0    Sig: TAKE 1 CAPSULE BY MOUTH EVERY DAY     Cardiovascular: CCB + ACEI Combos Failed - 05/06/2024  4:09 PM      Failed - Cr in normal range and within 180 days    Creat  Date Value Ref Range Status  11/13/2023 1.39 (H) 0.70 - 1.28 mg/dL Final   Creatinine, Urine  Date Value Ref Range Status  07/29/2017 88 20 - 320 mg/dL Final         Passed - K in normal range and within 180 days    Potassium  Date Value Ref Range Status  11/13/2023 4.4 3.5 - 5.3 mmol/L Final         Passed - Na in normal range and within 180 days    Sodium  Date Value Ref Range Status  11/13/2023 139 135 - 146 mmol/L Final  11/14/2021 141 134 - 144 mmol/L Final         Passed - eGFR is 30 or above and within 180 days    GFR, Est African American  Date Value Ref Range Status  10/11/2020 72 > OR = 60 mL/min/1.85m2 Final   GFR, Est Non African American  Date Value Ref Range Status  10/11/2020 62 > OR = 60 mL/min/1.47m2 Final   GFR, Estimated  Date Value Ref Range Status  12/17/2022 55 (L) >60 mL/min Final    Comment:    (NOTE) Calculated using the CKD-EPI Creatinine Equation (2021)    eGFR  Date Value Ref Range Status  11/13/2023 54 (L) > OR = 60 mL/min/1.14m2 Final  11/14/2021 74 >59 mL/min/1.73 Final         Passed - Patient is not pregnant      Passed - Last BP in normal range    BP Readings from Last 1 Encounters:  04/27/24 135/80         Passed - Valid encounter within last 6 months    Recent Outpatient Visits           5 months ago Well adult exam   [facility] Todd Mole, Todd Hutchinson       Future Appointments             In 6 days Todd Mole, Todd Hutchinson [facility]   In 1 month Todd Hutchinson, Todd BROCKS, Todd Hutchinson [facility] [place]

## 2024-05-12 ENCOUNTER — Ambulatory Visit (INDEPENDENT_AMBULATORY_CARE_PROVIDER_SITE_OTHER): Payer: Medicare HMO | Admitting: Family Medicine

## 2024-05-12 ENCOUNTER — Encounter: Payer: Self-pay | Admitting: Family Medicine

## 2024-05-12 VITALS — BP 132/70 | HR 90 | Resp 16 | Ht 67.0 in | Wt 231.0 lb

## 2024-05-12 DIAGNOSIS — N1832 Chronic kidney disease, stage 3b: Secondary | ICD-10-CM | POA: Diagnosis not present

## 2024-05-12 DIAGNOSIS — N183 Chronic kidney disease, stage 3 unspecified: Secondary | ICD-10-CM

## 2024-05-12 DIAGNOSIS — M1A0790 Idiopathic chronic gout, unspecified ankle and foot, without tophus (tophi): Secondary | ICD-10-CM | POA: Diagnosis not present

## 2024-05-12 DIAGNOSIS — D649 Anemia, unspecified: Secondary | ICD-10-CM | POA: Diagnosis not present

## 2024-05-12 DIAGNOSIS — J449 Chronic obstructive pulmonary disease, unspecified: Secondary | ICD-10-CM

## 2024-05-12 DIAGNOSIS — M791 Myalgia, unspecified site: Secondary | ICD-10-CM

## 2024-05-12 DIAGNOSIS — I739 Peripheral vascular disease, unspecified: Secondary | ICD-10-CM | POA: Diagnosis not present

## 2024-05-12 DIAGNOSIS — I10 Essential (primary) hypertension: Secondary | ICD-10-CM

## 2024-05-12 DIAGNOSIS — Z6837 Body mass index (BMI) 37.0-37.9, adult: Secondary | ICD-10-CM

## 2024-05-12 DIAGNOSIS — E66812 Obesity, class 2: Secondary | ICD-10-CM

## 2024-05-12 DIAGNOSIS — T466X5D Adverse effect of antihyperlipidemic and antiarteriosclerotic drugs, subsequent encounter: Secondary | ICD-10-CM

## 2024-05-12 DIAGNOSIS — E0822 Diabetes mellitus due to underlying condition with diabetic chronic kidney disease: Secondary | ICD-10-CM | POA: Diagnosis not present

## 2024-05-12 DIAGNOSIS — C61 Malignant neoplasm of prostate: Secondary | ICD-10-CM

## 2024-05-12 NOTE — Progress Notes (Signed)
 Name: Todd Hutchinson   MRN: [id number]    DOB: March 29, 1951   Date:05/12/2024       Progress Note  Chief Complaint  Patient presents with   Medical Management of Chronic Issues    6 month follow-up   Diabetes   Chronic Kidney Disease   COPD     Subjective:   Todd Hutchinson is a 73 y.o. male, presents to clinic for routine follow up on chronic conditions  Lab Results  Component Value Date   HGBA1C 5.3 11/13/2023   Lab Results  Component Value Date   LABURIC 7.0 08/01/2022   Last vitamin D  Lab Results  Component Value Date   VD25OH 37 11/13/2023   Discussed the use of AI scribe software for clinical note transcription with the patient, who gave verbal consent to proceed.  History of Present Illness Todd Hutchinson is a 73 year old male who presents for a routine follow-up visit.  Hematuria - Persistent trickle of blood without associated urinary symptoms - Recent cystoscopy demonstrated normal findings  Cutaneous purpura and easy bruising - Purplish spots present on arms, attributed to minor trauma from dog jumping - No recent significant trauma - Skin is more prone to bruising and tearing - Aspirin use may contribute to increased bruising  Gout management - Currently taking allopurinol  - No recent gout flares  Hypertension management - Medication regimen includes amlodipine , benazepril , bisoprolol , chlorthalidone , and hydralazine  - Also taking an unknown medication starting with 'E' at 25 mg, prescribed by a nephrologist  Cardiac rhythm and rate - No issues with heart rate - Recent EKG was normal - Currently taking bisoprolol   Peripheral neuropathy and foot symptoms - No current issues with feet, including pain or sensation problems      Current Outpatient Medications:    allopurinol  (ZYLOPRIM ) 100 MG tablet, TAKE 1 TABLET BY MOUTH EVERY DAY, Disp: 90 tablet, Rfl: 1   amLODipine -benazepril  (LOTREL) 10-40 MG capsule, TAKE 1  CAPSULE BY MOUTH EVERY DAY, Disp: 90 capsule, Rfl: 0   aspirin EC 81 MG tablet, Take 81 mg by mouth daily., Disp: , Rfl:    bisoprolol  (ZEBETA ) 10 MG tablet, TAKE 1 TABLET BY MOUTH EVERY DAY, Disp: 90 tablet, Rfl: 1   cephALEXin  (KEFLEX ) 500 MG capsule, Take 1 capsule (500 mg total) by mouth 2 (two) times daily., Disp: 20 capsule, Rfl: 0   chlorthalidone  (HYGROTON ) 50 MG tablet, Take 1 tablet (50 mg total) by mouth 2 (two) times daily., Disp: 180 tablet, Rfl: 2   Cholecalciferol (VITAMIN D -3) 25 MCG (1000 UT) CAPS, Take 1 capsule (1,000 Units total) by mouth daily., Disp: 90 capsule, Rfl: 1   eplerenone (INSPRA) 25 MG tablet, Take 25 mg by mouth daily., Disp: , Rfl:    ezetimibe  (ZETIA ) 10 MG tablet, Take 1 tablet (10 mg total) by mouth daily., Disp: 90 tablet, Rfl: 2   hydrALAZINE  (APRESOLINE ) 100 MG tablet, Take 1 tablet (100 mg total) by mouth 2 (two) times daily., Disp: 180 tablet, Rfl: 3   Omega-3 Fatty Acids (FISH OIL) 1000 MG CAPS, Take 1,000 mg by mouth daily., Disp: , Rfl:   Patient Active Problem List   Diagnosis Date Noted   Polyp of transverse colon 12/24/2023   Stenosis of intestine (HCC) 12/24/2023   Diabetes mellitus due to underlying condition with stage 3 chronic kidney disease, without long-term current use of insulin, unspecified whether stage 3a or 3b CKD (HCC) 11/13/2023   Myalgia due to statin 05/13/2023  Atherosclerosis of native artery of both lower extremities with intermittent claudication (HCC) 11/04/2022   Abnormal ankle brachial index (ABI) 08/04/2022   Chronic gout of foot 08/01/2022   Anemia 10/11/2020   Irregular heart rhythm 10/11/2020   Class 2 severe obesity with serious comorbidity and body mass index (BMI) of 37.0 to 37.9 in adult [facility]) 11/02/2018   History of colonic polyps    Benign neoplasm of ascending colon    Benign neoplasm of cecum    Hypertensive nephrosclerosis 01/27/2018   Prediabetes 06/30/2017   COPD (chronic obstructive pulmonary  disease) (HCC) 04/15/2017   Hx of tobacco use, presenting hazards to health 04/17/2016   Hyperlipidemia 03/13/2016   Benign essential hypertension 07/10/2015   Stage 3 chronic kidney disease (HCC) 07/10/2015   Cystitis, radiation 05/31/2012   Hematuria, microscopic 05/31/2012   ED (erectile dysfunction) of organic origin 05/31/2012   Hx of malignant neoplasm of prostate 05/31/2012    Past Surgical History:  Procedure Laterality Date   COLONOSCOPY N/A 12/24/2023   Procedure: COLONOSCOPY;  Surgeon: Jinny Carmine, MD;  Location: [facility] ENDOSCOPY;  Service: Endoscopy;  Laterality: N/A;   COLONOSCOPY WITH PROPOFOL  N/A 07/29/2018   Procedure: COLONOSCOPY WITH BIOPSIES;  Surgeon: Jinny Carmine, MD;  Location: [facility];  Service: Endoscopy;  Laterality: N/A;   POLYPECTOMY N/A 07/29/2018   Procedure: POLYPECTOMY;  Surgeon: Jinny Carmine, MD;  Location: [facility];  Service: Endoscopy;  Laterality: N/A;   POLYPECTOMY  12/24/2023   Procedure: POLYPECTOMY, INTESTINE;  Surgeon: Jinny Carmine, MD;  Location: [facility] ENDOSCOPY;  Service: Endoscopy;;   PROSTATE SURGERY      Family History  Problem Relation Age of Onset   Hypertension Mother    Aneurysm Mother 80       brain   Hypertension Father    Heart attack Father    Hypertension Sister    Hypertension Brother    Heart failure Brother    Heart disease Paternal Grandfather    Hypertension Sister    Hypertension Brother    Cancer Brother        throat cancer   Kidney cancer Neg Hx    Kidney failure Neg Hx    Prostate cancer Neg Hx    Sickle cell anemia Neg Hx    Tuberculosis Neg Hx     Social History   Tobacco Use   Smoking status: Former    Current packs/day: 0.00    Average packs/day: 1 pack/day for 40.0 years (40.0 ttl pk-yrs)    Types: Cigarettes    Start date: 11/1973    Quit date: 11/2013    Years since quitting: 10.5   Smokeless tobacco: Never  Vaping Use   Vaping status: Never Used  Substance Use Topics    Alcohol use: Yes    Alcohol/week: 16.0 standard drinks of alcohol    Types: 16 Shots of liquor per week    Comment: no more than 4 whiskey drinks a day; maybe 4 times day    Drug use: No     Allergies  Allergen Reactions   Atorvastatin Rash    Health Maintenance  Topic Date Due   FOOT EXAM  Never done   Medicare Annual Wellness (AWV)  07/04/2020   HEMOGLOBIN A1C  05/12/2024   COVID-19 Vaccine (3 - Moderna risk series) 05/27/2024 (Originally 01/07/2020)   Zoster Vaccines- Shingrix  (1 of 2) 08/11/2024 (Originally 12/08/1970)   Diabetic kidney evaluation - Urine ACR  09/02/2024 (Originally 07/29/2018)   Lung Cancer Screening  11/17/2024 (Originally 12/07/2001)   INFLUENZA VACCINE  12/13/2024 (Originally 04/15/2024)   Diabetic kidney evaluation - eGFR measurement  11/12/2024   OPHTHALMOLOGY EXAM  11/30/2024   Colonoscopy  12/23/2028   Pneumococcal Vaccine: 50+ Years  Completed   Hepatitis C Screening  Completed   HPV VACCINES  Aged Out   Meningococcal B Vaccine  Aged Out   DTaP/Tdap/Td  Discontinued    Chart Review Today: I personally reviewed active problem list, medication list, allergies, family history, social history, health maintenance, notes from last encounter, lab results, imaging with the patient/caregiver today.   Review of Systems  Constitutional: Negative.   HENT: Negative.    Eyes: Negative.   Respiratory: Negative.    Cardiovascular: Negative.   Gastrointestinal: Negative.   Endocrine: Negative.   Genitourinary: Negative.   Musculoskeletal: Negative.   Skin: Negative.   Allergic/Immunologic: Negative.   Neurological: Negative.   Hematological: Negative.   Psychiatric/Behavioral: Negative.    All other systems reviewed and are negative.    Objective:   Vitals:   05/12/24 1059  BP: 132/70  Pulse: 90  Resp: 16  SpO2: 96%  Weight: 231 lb (104.8 kg)  Height: 5' 7 (1.702 m)    Body mass index is 36.18 kg/m.  Physical Exam Vitals and nursing  note reviewed.  Constitutional:      General: He is not in acute distress.    Appearance: Normal appearance. He is well-developed. He is obese. He is not ill-appearing, toxic-appearing or diaphoretic.  HENT:     Head: Normocephalic and atraumatic.     Right Ear: External ear normal.     Left Ear: External ear normal.     Nose: Nose normal.  Eyes:     General: No scleral icterus.       Right eye: No discharge.        Left eye: No discharge.     Conjunctiva/sclera: Conjunctivae normal.  Neck:     Trachea: No tracheal deviation.  Cardiovascular:     Rate and Rhythm: Normal rate and regular rhythm.     Chest Wall: PMI is displaced.     Pulses: Normal pulses.          Radial pulses are 2+ on the right side and 2+ on the left side.     Heart sounds: Normal heart sounds. No murmur heard.    No friction rub. No gallop.  Pulmonary:     Effort: Pulmonary effort is normal. No respiratory distress.     Breath sounds: Normal breath sounds. No stridor. No wheezing, rhonchi or rales.  Musculoskeletal:     Right lower leg: 1+ Edema present.     Left lower leg: 1+ Edema present.  Skin:    General: Skin is warm and dry.     Findings: No rash.  Neurological:     Mental Status: He is alert.     Motor: No abnormal muscle tone.     Coordination: Coordination normal.     Gait: Gait normal.  Psychiatric:        Mood and Affect: Mood normal.        Behavior: Behavior normal.     Home BP readings from pt today     Results for orders placed or performed in visit on 04/27/24  Microscopic Examination   Collection Time: 04/27/24  1:01 PM   Urine  Result Value Ref Range   WBC, UA 0-5 0 - 5 /hpf   RBC, Urine None seen 0 -  2 /hpf   Epithelial Cells (non renal) >10 (A) 0 - 10 /hpf   Bacteria, UA Few None seen/Few  Urinalysis, Complete   Collection Time: 04/27/24  1:01 PM  Result Value Ref Range   Specific Gravity, UA <1.005 (L) 1.005 - 1.030   pH, UA 6.0 5.0 - 7.5   Color, UA Yellow Yellow    Appearance Ur Clear Clear   Leukocytes,UA Negative Negative   Protein,UA Negative Negative/Trace   Glucose, UA Negative Negative   Ketones, UA Negative Negative   RBC, UA Negative Negative   Bilirubin, UA Negative Negative   Urobilinogen, Ur 0.2 0.2 - 1.0 mg/dL   Nitrite, UA Negative Negative   Microscopic Examination Comment    Microscopic Examination See below:       Assessment & Plan:    Assessment & Plan  Benign essential hypertension Assessment & Plan: BP control improved, at goal today, home readings have been excellent see photo BP Readings from Last 3 Encounters:  05/12/24 132/70  04/27/24 135/80  03/16/24 (!) 140/60  Continue same meds/doses with diet/lifestyle efforts Blood pressure management ongoing with amlodipine -benazepril , bisoprolol , chlorthalidone , and hydralazine . Hydralazine  dosage confirmed at 100 mg twice daily. - Continue current antihypertensive medications. - Ensure hydralazine  is taken at 100 mg twice daily. - Monitor for symptoms of fluid retention or complications such as increased leg swelling, weight gain, or difficulty breathing.   Chronic gout of foot, unspecified cause, unspecified laterality Assessment & Plan: Gout well-managed with allopurinol . No recent flares reported. - Check uric acid levels. - Continue allopurinol .  Orders: -     Uric acid  Diabetes mellitus due to underlying condition with stage 3 chronic kidney disease, without long-term current use of insulin, unspecified whether stage 3a or 3b CKD (HCC) Assessment & Plan: Diabetes management ongoing with focus on kidney function. Cardiologist reported good blood sugar levels. - Check A1c, kidney, and liver function tests.  Orders: -     Hemoglobin A1c -     Comprehensive metabolic panel with GFR -     Microalbumin / creatinine urine ratio  Chronic obstructive pulmonary disease, unspecified COPD type (HCC) Assessment & Plan: Sx stable   Class 2 severe obesity with  serious comorbidity and body mass index (BMI) of 37.0 to 37.9 in adult, unspecified obesity type [facility]) Assessment & Plan: With multiple associated cormobidities HTN, HLD anemia COPD CKD Weight down a few lbs Diet/lifestyle efforts encouraged Wt Readings from Last 5 Encounters:  05/12/24 231 lb (104.8 kg)  04/27/24 236 lb (107 kg)  03/03/24 236 lb (107 kg)  12/24/23 226 lb 3.2 oz (102.6 kg)  11/13/23 228 lb (103.4 kg)   BMI Readings from Last 5 Encounters:  05/12/24 36.18 kg/m  04/27/24 36.96 kg/m  03/03/24 36.96 kg/m  12/24/23 35.43 kg/m  11/13/23 35.71 kg/m      Anemia, unspecified type Assessment & Plan: Hemoglobin  Date Value Ref Range Status  11/13/2023 11.4 (L) 13.2 - 17.1 g/dL Final  97/79/7975 87.9 (L) 13.2 - 17.1 g/dL Final  88/82/7976 88.5 (L) 13.2 - 17.1 g/dL Final  91/96/7983 89.1 (L) 12.6 - 17.7 g/dL Final   HGB  Date Value Ref Range Status  11/03/2013 12.2 (L) 13.0 - 18.0 g/dL Final     Orders: -     CBC with Differential/Platelet  Myalgia due to statin Assessment & Plan: On repatha    Stage 3 chronic kidney disease, unspecified whether stage 3a or 3b CKD (HCC) Assessment & Plan: eGFR has ranged  45 to greater than 60 Recheck renal function      General Health Maintenance Routine screenings and preventative health measures coordinated. PSA checked earlier this year, not due until next year. Abdominal aorta screening discussed, further clarification needed with referral team. - Coordinate routine screenings and preventative health measures. - Clarify abdominal aorta screening status with referral team.  Recording duration: 16 minutes    Return in about 6 months (around 11/12/2024) for CPE/MWV.   Michelene Cower, PA-C 05/12/24 11:17 AM

## 2024-05-13 ENCOUNTER — Ambulatory Visit: Payer: Self-pay | Admitting: Family Medicine

## 2024-05-13 LAB — COMPREHENSIVE METABOLIC PANEL WITH GFR
AG Ratio: 1.3 (calc) (ref 1.0–2.5)
ALT: 21 U/L (ref 9–46)
AST: 14 U/L (ref 10–35)
Albumin: 4.2 g/dL (ref 3.6–5.1)
Alkaline phosphatase (APISO): 31 U/L — ABNORMAL LOW (ref 35–144)
BUN: 25 mg/dL (ref 7–25)
CO2: 24 mmol/L (ref 20–32)
Calcium: 9.7 mg/dL (ref 8.6–10.3)
Chloride: 106 mmol/L (ref 98–110)
Creat: 1.27 mg/dL (ref 0.70–1.28)
Globulin: 3.2 g/dL (ref 1.9–3.7)
Glucose, Bld: 90 mg/dL (ref 65–99)
Potassium: 4.5 mmol/L (ref 3.5–5.3)
Sodium: 139 mmol/L (ref 135–146)
Total Bilirubin: 0.4 mg/dL (ref 0.2–1.2)
Total Protein: 7.4 g/dL (ref 6.1–8.1)
eGFR: 60 mL/min/1.73m2 (ref >=60)

## 2024-05-13 LAB — CBC WITH DIFFERENTIAL/PLATELET
Absolute Lymphocytes: 1494 {cells}/uL (ref 850–3900)
Absolute Monocytes: 845 {cells}/uL (ref 200–950)
Basophils Absolute: 52 {cells}/uL (ref 0–200)
Basophils Relative: 0.5 %
Eosinophils Absolute: 268 {cells}/uL (ref 15–500)
Eosinophils Relative: 2.6 %
HCT: 33.3 % — ABNORMAL LOW (ref 38.5–50.0)
Hemoglobin: 10.5 g/dL — ABNORMAL LOW (ref 13.2–17.1)
MCH: 27.4 pg (ref 27.0–33.0)
MCHC: 31.5 g/dL — ABNORMAL LOW (ref 32.0–36.0)
MCV: 86.9 fL (ref 80.0–100.0)
MPV: 13.1 fL — ABNORMAL HIGH (ref 7.5–12.5)
Monocytes Relative: 8.2 %
Neutro Abs: 7643 {cells}/uL (ref 1500–7800)
Neutrophils Relative %: 74.2 %
Platelets: 226 Thousand/uL (ref 140–400)
RBC: 3.83 Million/uL — ABNORMAL LOW (ref 4.20–5.80)
RDW: 15.2 % — ABNORMAL HIGH (ref 11.0–15.0)
Total Lymphocyte: 14.5 %
WBC: 10.3 Thousand/uL (ref 3.8–10.8)

## 2024-05-13 LAB — HEMOGLOBIN A1C
Hgb A1c MFr Bld: 5.3 % (ref <5.7)
Mean Plasma Glucose: 105 mg/dL
eAG (mmol/L): 5.8 mmol/L

## 2024-05-13 LAB — URIC ACID: Uric Acid, Serum: 6.6 mg/dL (ref 4.0–8.0)

## 2024-05-13 NOTE — Assessment & Plan Note (Addendum)
 BP control improved, at goal today, home readings have been excellent see photo BP Readings from Last 3 Encounters:  05/12/24 132/70  04/27/24 135/80  03/16/24 (!) 140/60  Continue same meds/doses with diet/lifestyle efforts Blood pressure management ongoing with amlodipine -benazepril , bisoprolol , chlorthalidone , and hydralazine . Hydralazine  dosage confirmed at 100 mg twice daily. - Continue current antihypertensive medications. - Ensure hydralazine  is taken at 100 mg twice daily. - Monitor for symptoms of fluid retention or complications such as increased leg swelling, weight gain, or difficulty breathing.

## 2024-05-13 NOTE — Assessment & Plan Note (Signed)
 On repatha

## 2024-05-13 NOTE — Assessment & Plan Note (Signed)
 Diabetes management ongoing with focus on kidney function. Cardiologist reported good blood sugar levels. - Check A1c, kidney, and liver function tests.

## 2024-05-13 NOTE — Assessment & Plan Note (Signed)
 Gout well-managed with allopurinol . No recent flares reported. - Check uric acid levels. - Continue allopurinol .

## 2024-05-13 NOTE — Assessment & Plan Note (Signed)
 eGFR has ranged 45 to greater than 60 Recheck renal function

## 2024-05-13 NOTE — Assessment & Plan Note (Signed)
 Hemoglobin  Date Value Ref Range Status  11/13/2023 11.4 (L) 13.2 - 17.1 g/dL Final  97/79/7975 87.9 (L) 13.2 - 17.1 g/dL Final  88/82/7976 88.5 (L) 13.2 - 17.1 g/dL Final  91/96/7983 89.1 (L) 12.6 - 17.7 g/dL Final   HGB  Date Value Ref Range Status  11/03/2013 12.2 (L) 13.0 - 18.0 g/dL Final

## 2024-05-13 NOTE — Assessment & Plan Note (Signed)
 With multiple associated cormobidities HTN, HLD anemia COPD CKD Weight down a few lbs Diet/lifestyle efforts encouraged Wt Readings from Last 5 Encounters:  05/12/24 231 lb (104.8 kg)  04/27/24 236 lb (107 kg)  03/03/24 236 lb (107 kg)  12/24/23 226 lb 3.2 oz (102.6 kg)  11/13/23 228 lb (103.4 kg)   BMI Readings from Last 5 Encounters:  05/12/24 36.18 kg/m  04/27/24 36.96 kg/m  03/03/24 36.96 kg/m  12/24/23 35.43 kg/m  11/13/23 35.71 kg/m

## 2024-05-13 NOTE — Assessment & Plan Note (Signed)
Sx stable

## 2024-05-17 ENCOUNTER — Other Ambulatory Visit: Payer: Self-pay

## 2024-05-17 DIAGNOSIS — D649 Anemia, unspecified: Secondary | ICD-10-CM

## 2024-05-23 ENCOUNTER — Other Ambulatory Visit: Payer: Self-pay

## 2024-05-23 ENCOUNTER — Emergency Department
Admission: EM | Admit: 2024-05-23 | Discharge: 2024-05-23 | Disposition: A | Discharge status: Home / Self Care | Source: Ambulatory Visit

## 2024-05-23 ENCOUNTER — Encounter: Payer: Self-pay | Admitting: Emergency Medicine

## 2024-05-23 DIAGNOSIS — I129 Hypertensive chronic kidney disease with stage 1 through stage 4 chronic kidney disease, or unspecified chronic kidney disease: Secondary | ICD-10-CM | POA: Insufficient documentation

## 2024-05-23 DIAGNOSIS — Z8546 Personal history of malignant neoplasm of prostate: Secondary | ICD-10-CM | POA: Insufficient documentation

## 2024-05-23 DIAGNOSIS — K130 Diseases of lips: Secondary | ICD-10-CM | POA: Diagnosis not present

## 2024-05-23 DIAGNOSIS — J449 Chronic obstructive pulmonary disease, unspecified: Secondary | ICD-10-CM | POA: Insufficient documentation

## 2024-05-23 DIAGNOSIS — T464X5A Adverse effect of angiotensin-converting-enzyme inhibitors, initial encounter: Secondary | ICD-10-CM | POA: Diagnosis not present

## 2024-05-23 DIAGNOSIS — T783XXA Angioneurotic edema, initial encounter: Secondary | ICD-10-CM | POA: Insufficient documentation

## 2024-05-23 DIAGNOSIS — N183 Chronic kidney disease, stage 3 unspecified: Secondary | ICD-10-CM | POA: Diagnosis not present

## 2024-05-23 DIAGNOSIS — T7840XA Allergy, unspecified, initial encounter: Secondary | ICD-10-CM | POA: Diagnosis present

## 2024-05-23 MED ORDER — EPINEPHRINE PF 1 MG/ML IJ SOLN
0.3000 mg | Freq: Once | INTRAMUSCULAR | Status: AC
  Administered 2024-05-23: 0.3 mg via INTRAMUSCULAR
  Filled 2024-05-23: qty 1

## 2024-05-23 MED ORDER — DIPHENHYDRAMINE HCL 25 MG PO CAPS: 25.0000 mg | ORAL_CAPSULE | Freq: Three times a day (TID) | ORAL | 0 refills | Status: AC

## 2024-05-23 MED ORDER — PREDNISONE 50 MG PO TABS: ORAL_TABLET | ORAL | 0 refills | Status: AC

## 2024-05-23 MED ORDER — TRANEXAMIC ACID-NACL 1000-0.7 MG/100ML-% IV SOLN
1000.0000 mg | INTRAVENOUS | Status: AC
  Administered 2024-05-23: 1000 mg via INTRAVENOUS
  Filled 2024-05-23: qty 100

## 2024-05-23 MED ORDER — DIPHENHYDRAMINE HCL 50 MG/ML IJ SOLN
50.0000 mg | Freq: Once | INTRAMUSCULAR | Status: AC
  Administered 2024-05-23: 50 mg via INTRAVENOUS
  Filled 2024-05-23: qty 1

## 2024-05-23 MED ORDER — AMLODIPINE BESYLATE 10 MG PO TABS: 10.0000 mg | ORAL_TABLET | Freq: Every day | ORAL | 0 refills | Status: DC

## 2024-05-23 MED ORDER — METHYLPREDNISOLONE SODIUM SUCC 125 MG IJ SOLR
125.0000 mg | Freq: Once | INTRAMUSCULAR | Status: AC
  Administered 2024-05-23: 125 mg via INTRAVENOUS
  Filled 2024-05-23: qty 2

## 2024-05-23 NOTE — ED Triage Notes (Addendum)
 PT denies any SOB, denies any pain, no itching or difficulty swallowing. Swelling started after breakfast at [employer]. He ate biscuit and gravy but has had many times in the past.

## 2024-05-23 NOTE — ED Provider Notes (Signed)
 [facility] Provider Note    Event Date/Time   First MD Initiated Contact with Patient 05/23/24 1808     (approximate)   History   Allergic Reaction   HPI  Jayren Cease is a 73 y.o. male  with a past medical history of COPD, stage III CKD, hypertension, prostate cancer presents to the emergency department with upper and lower lip swelling that started around 10 AM this morning.  Patient was seen at urgent care and told to come here for evaluation.  Patient denies nausea, vomiting, difficulty breathing/shortness of breath, rash, feeling like his throat is closing up, throat itching, tongue swelling.  Patient originally had facial swelling when he came to urgent care but states it has decreased and his lip swelling has decreased as well.  Patient is on a calcium channel blocker-ACE inhibitor combo.  Patient states he has never had a reaction like this before.  Last dose of blood pressure medication was last night.  Patient has not taken any medication prior to today's visit.  Patient has not tried any new foods, detergents, creams.    Physical Exam   Triage Vital Signs: ED Triage Vitals  Encounter Vitals Group     BP 05/23/24 1631 (!) 141/66     Girls Systolic BP Percentile --      Girls Diastolic BP Percentile --      Boys Systolic BP Percentile --      Boys Diastolic BP Percentile --      Pulse Rate 05/23/24 1631 90     Resp 05/23/24 1631 18     Temp 05/23/24 1631 99.3 F (37.4 C)     Temp src --      SpO2 05/23/24 1631 95 %     Weight 05/23/24 1632 229 lb 4.5 oz (104 kg)     Height --      Head Circumference --      Peak Flow --      Pain Score 05/23/24 1632 0     Pain Loc --      Pain Education --      Exclude from Growth Chart --     Most recent vital signs: Vitals:   05/23/24 2030 05/23/24 2100  BP: (!) 137/55 (!) 134/57  Pulse: 80 80  Resp: (!) 25 (!) 22  Temp:    SpO2: 95% 92%    General: Awake, in no acute distress.  Appears stated age. Head: Normocephalic, atraumatic. Eyes: No scleral icterus or conjunctival injection.  No periorbital swelling. Ears/Nose/Throat: Nares patent, no nasal discharge. Oropharynx moist, no erythema or exudate.  Uvula midline.  No tongue swelling or asymmetry.  Upper and lower lips with significant swelling.  No facial swelling. Airway visible. Neck: Supple, no lymphadenopathy. CV: Good peripheral perfusion. Regular rate, 80 bpm. Respiratory:Normal respiratory effort.  No respiratory distress. CTAB. GI: Soft, non-distended, non-tender.  MSK: Moving all extremities with ease. Skin:Warm, dry, intact. No rashes.  ED Results / Procedures / Treatments   Labs (all labs ordered are listed, but only abnormal results are displayed) Labs Reviewed - No data to display   EKG     RADIOLOGY    PROCEDURES:  Critical Care performed: No   Procedures   MEDICATIONS ORDERED IN ED: Medications  methylPREDNISolone  sodium succinate (SOLU-MEDROL ) 125 mg/2 mL injection 125 mg (125 mg Intravenous Given 05/23/24 1916)  diphenhydrAMINE  (BENADRYL ) injection 50 mg (50 mg Intravenous Given 05/23/24 1916)  EPINEPHrine  (ADRENALIN ) 0.3 mg (0.3 mg  Intramuscular Given 05/23/24 1917)  tranexamic acid  (CYKLOKAPRON ) IVPB 1,000 mg (0 mg Intravenous Stopped 05/23/24 2000)     IMPRESSION / MDM / ASSESSMENT AND PLAN / ED COURSE  I reviewed the triage vital signs and the nursing notes.                              Differential diagnosis includes, but is not limited to, ACE-inhibitor induced angioedema, anaphylaxis, allergic reaction  Patient's presentation is most consistent with acute presentation with potential threat to life or bodily function.  6:45 p.m. Patient is a 73 year old male who presented with lip and facial swelling that started around 10 AM today. Review of his chart shows patient was seen at [facility] clinic today around 2:45 p.m. and told to come to the ER for evaluation and treatment.   Patient had no tongue swelling, uvular deviation, signs of airway compromise at this time.  Patient was given methylprednisolone , Benadryl , epinephrine  and tranexamic acid  here in the emergency department and will remain for observation for 4 hours at minimum.  Believe this is likely ACE inhibitor induced angioedema.  11:15 p.m. patient reevaluated.  Patient states lip swelling feels less than compared to when he was initially first roomed.  On my exam it seems the lip swelling is about the same at this time; no facial swelling or tongue swelling.  Patient has been well-appearing, no respiratory distress; normal respiratory rate in the room although the monitor has read values of 23, 24 and 26 in the past 1-2 hours.  Patient has had no throat itching, feel like his throat closing up, or difficulty breathing during his observation period here.  Did discuss with him the need to discontinue his amlodipine -benazepril  medication as this is likely a reaction to his ACE-inhibitor.  Will send him a home with a new prescription for just amlodipine . Did teach back with the patient and his wife to make sure they understood instructions. He will need to follow-up with his primary care provider to change his blood pressure medications accordingly.  Also provided prednisone  and Benadryl  prescriptions that he will need to take as prescribed.    The patient may return to the emergency department for any new, worsening, or concerning symptoms. Patient was given the opportunity to ask questions; all questions were answered. Strict emergency department return precautions were discussed with the patient.  Patient is in agreement to the treatment plan.  Patient is stable for discharge.   FINAL CLINICAL IMPRESSION(S) / ED DIAGNOSES   Final diagnoses:  ACE inhibitor-aggravated angioedema, initial encounter     Rx / DC Orders   ED Discharge Orders     None        Note:  This document was prepared using Dragon voice  recognition software and may include unintentional dictation errors.     Sheron Salm, PA-C 05/23/24 2332    Clarine Ozell LABOR, MD 05/24/24 [phone]

## 2024-05-23 NOTE — ED Triage Notes (Signed)
 Pt comes from [facility] with new lip swelling that started this morning. Pt denies any new medications or changes.

## 2024-05-23 NOTE — Discharge Instructions (Addendum)
 You were seen in the emergency department for a reaction from one of your blood pressure medications.  Please stop taking the amlodipine -benazepril  medication immediately.  I will send you in a new prescription for just amlodipine .  You may take this with your regular blood pressure medication.  Please return to the emergency department for any difficulty breathing, throat itching, feeling like your throat is closing up, facial swelling, rash, nausea, vomiting or any worsening in your lip swelling.  I have also sent in a steroid prescription and Benadryl  for you.  Please pick up and take these medications as prescribed.  Please follow-up with your primary care provider immediately for change in blood pressure medications.

## 2024-05-27 ENCOUNTER — Encounter: Payer: Self-pay | Admitting: Family Medicine

## 2024-05-27 ENCOUNTER — Ambulatory Visit (INDEPENDENT_AMBULATORY_CARE_PROVIDER_SITE_OTHER): Admitting: Family Medicine

## 2024-05-27 VITALS — BP 118/70 | HR 89 | Resp 16 | Ht 67.0 in | Wt 229.0 lb

## 2024-05-27 DIAGNOSIS — D649 Anemia, unspecified: Secondary | ICD-10-CM

## 2024-05-27 DIAGNOSIS — Z09 Encounter for follow-up examination after completed treatment for conditions other than malignant neoplasm: Secondary | ICD-10-CM

## 2024-05-27 DIAGNOSIS — N1832 Chronic kidney disease, stage 3b: Secondary | ICD-10-CM | POA: Diagnosis not present

## 2024-05-27 DIAGNOSIS — T783XXA Angioneurotic edema, initial encounter: Secondary | ICD-10-CM

## 2024-05-27 DIAGNOSIS — I10 Essential (primary) hypertension: Secondary | ICD-10-CM

## 2024-05-27 NOTE — Progress Notes (Signed)
 Patient ID: Todd Todd, male    DOB: 1950-10-07, 73 y.o.   MRN: [id number]  PCP: Leavy Mole, PA-C  Chief Complaint  Patient presents with   Hospitalization Follow-up    Subjective:   Todd Todd is a 73 y.o. male, presents to clinic with CC of the following:  HPI  ED f/up for angioedema suspected from ACEI Tx with steroids benadryl  and epi, told to stop ACEI BP good today No SOB airway swelling lips back to normal now BP Readings from Last 3 Encounters:  05/27/24 118/70  05/23/24 139/60  05/12/24 132/70   Currently on amlodipine  10, hydralazine  100 mg BID, chlorthalidone  50 mg BID per cardiology, bisoprolol  Discussed the use of AI scribe software for clinical note transcription with the patient, who gave verbal consent to proceed.  History of Present Illness Todd Todd is a 73 year old male with hypertension who presents with lip swelling after a possible angioedema reaction to an ACE inhibitor.  Angioedema and lip swelling - Lip swelling occurred approximately four days ago, prompting an ER visit on September 8th - No throat closing or tongue swelling - Treated in the ER with steroids and Benadryl   Antihypertensive medication exposure and change - Longstanding use of an ACE inhibitor (10+ years) prior to the episode of lip swelling - Instructed to discontinue combination blood pressure medication after the angioedema event - ER sent in amlodipine  10 by itself (it was in combo with ACEI) with a 30-day supply - No prior use of ARBs such as losartan - Blood pressure has remained stable since the medication change - Currently monitoring blood pressure at home  Anemia and hematologic findings - History of anemia - Recent hemoglobin decreased from 11.4 in February to 10.5 - No bleeding or blood in the stool - Previous stool sample tests negative for occult blood  Genitourinary and oncologic history - History of prostate cancer  treated with radiation - Abnormal urinalysis in August - No recent urine sample obtained - No significant genitourinary symptoms      Patient Active Problem List   Diagnosis Date Noted   Polyp of transverse colon 12/24/2023   Stenosis of intestine (HCC) 12/24/2023   Diabetes mellitus due to underlying condition with stage 3 chronic kidney disease, without long-term current use of insulin, unspecified whether stage 3a or 3b CKD (HCC) 11/13/2023   Myalgia due to statin 05/13/2023   Atherosclerosis of native artery of both lower extremities with intermittent claudication (HCC) 11/04/2022   Abnormal ankle brachial index (ABI) 08/04/2022   Chronic gout of foot 08/01/2022   Anemia 10/11/2020   Irregular heart rhythm 10/11/2020   Class 2 severe obesity with serious comorbidity and body mass index (BMI) of 37.0 to 37.9 in adult [facility]) 11/02/2018   History of colonic polyps    Benign neoplasm of ascending colon    Benign neoplasm of cecum    Hypertensive nephrosclerosis 01/27/2018   Prediabetes 06/30/2017   COPD (chronic obstructive pulmonary disease) (HCC) 04/15/2017   Hx of tobacco use, presenting hazards to health 04/17/2016   Hyperlipidemia 03/13/2016   Benign essential hypertension 07/10/2015   Stage 3 chronic kidney disease (HCC) 07/10/2015   Cystitis, radiation 05/31/2012   Hematuria, microscopic 05/31/2012   ED (erectile dysfunction) of organic origin 05/31/2012   Hx of malignant neoplasm of prostate 05/31/2012      Current Outpatient Medications:    allopurinol  (ZYLOPRIM ) 100 MG tablet, TAKE 1 TABLET BY MOUTH EVERY DAY, Disp: 90  tablet, Rfl: 1   amLODipine  (NORVASC ) 10 MG tablet, Take 1 tablet (10 mg total) by mouth daily., Disp: 30 tablet, Rfl: 0   aspirin EC 81 MG tablet, Take 81 mg by mouth daily., Disp: , Rfl:    bisoprolol  (ZEBETA ) 10 MG tablet, TAKE 1 TABLET BY MOUTH EVERY DAY, Disp: 90 tablet, Rfl: 1   chlorthalidone  (HYGROTON ) 50 MG tablet, Take 1 tablet (50 mg  total) by mouth 2 (two) times daily., Disp: 180 tablet, Rfl: 2   Cholecalciferol (VITAMIN D -3) 25 MCG (1000 UT) CAPS, Take 1 capsule (1,000 Units total) by mouth daily., Disp: 90 capsule, Rfl: 1   diphenhydrAMINE  (BENADRYL  ALLERGY) 25 mg capsule, Take 1 capsule (25 mg total) by mouth every 8 (eight) hours for 7 days., Disp: 21 capsule, Rfl: 0   eplerenone (INSPRA) 25 MG tablet, Take 25 mg by mouth daily., Disp: , Rfl:    ezetimibe  (ZETIA ) 10 MG tablet, Take 1 tablet (10 mg total) by mouth daily., Disp: 90 tablet, Rfl: 2   hydrALAZINE  (APRESOLINE ) 100 MG tablet, Take 1 tablet (100 mg total) by mouth 2 (two) times daily., Disp: 180 tablet, Rfl: 3   Omega-3 Fatty Acids (FISH OIL) 1000 MG CAPS, Take 1,000 mg by mouth daily., Disp: , Rfl:    predniSONE  (DELTASONE ) 50 MG tablet, Please take 1 pill each day in the morning., Disp: 5 tablet, Rfl: 0   Allergies  Allergen Reactions   Atorvastatin Rash     Social History   Tobacco Use   Smoking status: Former    Current packs/day: 0.00    Average packs/day: 1 pack/day for 40.0 years (40.0 ttl pk-yrs)    Types: Cigarettes    Start date: 11/1973    Quit date: 11/2013    Years since quitting: 10.5   Smokeless tobacco: Never  Vaping Use   Vaping status: Never Used  Substance Use Topics   Alcohol use: Yes    Alcohol/week: 16.0 standard drinks of alcohol    Types: 16 Shots of liquor per week    Comment: no more than 4 whiskey drinks a day; maybe 4 times day    Drug use: No      Chart Review Today: I personally reviewed active problem list, medication list, allergies, family history, social history, health maintenance, notes from last encounter, lab results, imaging with the patient/caregiver today.   Review of Systems  Constitutional: Negative.   HENT: Negative.    Eyes: Negative.   Respiratory: Negative.    Cardiovascular: Negative.   Gastrointestinal: Negative.   Endocrine: Negative.   Genitourinary: Negative.   Musculoskeletal:  Negative.   Skin: Negative.   Allergic/Immunologic: Negative.   Neurological: Negative.   Hematological: Negative.   Psychiatric/Behavioral: Negative.    All other systems reviewed and are negative.      Objective:   Vitals:   05/27/24 1125  BP: 118/70  Pulse: 89  Resp: 16  SpO2: 98%  Weight: 229 lb (103.9 kg)  Height: 5' 7 (1.702 m)    Body mass index is 35.87 kg/m.  Physical Exam Vitals and nursing note reviewed.  Constitutional:      General: He is not in acute distress.    Appearance: Normal appearance. He is well-developed. He is obese. He is not ill-appearing, toxic-appearing or diaphoretic.  HENT:     Head: Normocephalic and atraumatic.     Right Ear: External ear normal.     Left Ear: External ear normal.     Nose: Nose  normal.     Mouth/Throat:     Mouth: Mucous membranes are moist. No oral lesions or angioedema.     Tongue: No lesions.     Pharynx: Oropharynx is clear. Uvula midline. No pharyngeal swelling, oropharyngeal exudate, posterior oropharyngeal erythema, uvula swelling or postnasal drip.  Eyes:     General: No scleral icterus.       Right eye: No discharge.        Left eye: No discharge.     Conjunctiva/sclera: Conjunctivae normal.  Neck:     Trachea: No tracheal deviation.  Cardiovascular:     Rate and Rhythm: Normal rate.     Pulses: Normal pulses.     Heart sounds: Normal heart sounds.  Pulmonary:     Effort: Pulmonary effort is normal. No respiratory distress.     Breath sounds: Normal breath sounds. No stridor.  Skin:    General: Skin is warm and dry.     Findings: No rash.  Neurological:     Mental Status: He is alert.     Motor: No abnormal muscle tone.     Coordination: Coordination normal.     Gait: Gait normal.  Psychiatric:        Mood and Affect: Mood normal.        Behavior: Behavior normal.      Results for orders placed or performed in visit on 05/12/24  HgB A1c   Collection Time: 05/12/24 12:01 PM  Result Value  Ref Range   Hgb A1c MFr Bld 5.3 <5.7 %   Mean Plasma Glucose 105 mg/dL   eAG (mmol/L) 5.8 mmol/L  Comprehensive Metabolic Panel (CMET)   Collection Time: 05/12/24 12:01 PM  Result Value Ref Range   Glucose, Bld 90 65 - 99 mg/dL   BUN 25 7 - 25 mg/dL   Creat 8.72 9.29 - 8.71 mg/dL   eGFR 60 > OR = 60 fO/fpw/8.26f7   BUN/Creatinine Ratio SEE NOTE: 6 - 22 (calc)   Sodium 139 135 - 146 mmol/L   Potassium 4.5 3.5 - 5.3 mmol/L   Chloride 106 98 - 110 mmol/L   CO2 24 20 - 32 mmol/L   Calcium 9.7 8.6 - 10.3 mg/dL   Total Protein 7.4 6.1 - 8.1 g/dL   Albumin 4.2 3.6 - 5.1 g/dL   Globulin 3.2 1.9 - 3.7 g/dL (calc)   AG Ratio 1.3 1.0 - 2.5 (calc)   Total Bilirubin 0.4 0.2 - 1.2 mg/dL   Alkaline phosphatase (APISO) 31 (L) 35 - 144 U/L   AST 14 10 - 35 U/L   ALT 21 9 - 46 U/L  CBC with Differential/Platelet   Collection Time: 05/12/24 12:01 PM  Result Value Ref Range   WBC 10.3 3.8 - 10.8 Thousand/uL   RBC 3.83 (L) 4.20 - 5.80 Million/uL   Hemoglobin 10.5 (L) 13.2 - 17.1 g/dL   HCT 66.6 (L) 61.4 - 49.9 %   MCV 86.9 80.0 - 100.0 fL   MCH 27.4 27.0 - 33.0 pg   MCHC 31.5 (L) 32.0 - 36.0 g/dL   RDW 84.7 (H) 88.9 - 84.9 %   Platelets 226 140 - 400 Thousand/uL   MPV 13.1 (H) 7.5 - 12.5 fL   Neutro Abs 7,643 1,500 - 7,800 cells/uL   Absolute Lymphocytes 1,494 850 - 3,900 cells/uL   Absolute Monocytes 845 200 - 950 cells/uL   Eosinophils Absolute 268 15 - 500 cells/uL   Basophils Absolute 52 0 - 200 cells/uL  Neutrophils Relative % 74.2 %   Total Lymphocyte 14.5 %   Monocytes Relative 8.2 %   Eosinophils Relative 2.6 %   Basophils Relative 0.5 %  Uric acid   Collection Time: 05/12/24 12:01 PM  Result Value Ref Range   Uric Acid, Serum 6.6 4.0 - 8.0 mg/dL       Assessment & Plan:   Assessment & Plan  1. Hospital discharge follow-up (Primary) 2. Angioedema, initial encounter ER visit reviewed previously and again today Angioedema ED encounter Monday suspected due to ACE  inhibitor (added to allergy list on chart) Recent episode of angioedema likely due to ACE inhibitor, presenting with lip swelling. Resolved with steroids and Benadryl . No throat or tongue swelling reported. ACE inhibitors discontinued, and amlodipine  initiated. Blood pressure currently stable. - Monitor blood pressure at home and report readings before running out of amlodipine . - Consider switching to an ARB like losartan if blood pressure control is needed and no further angioedema occurs. - Avoid ACE inhibitors in the future due to allergy. - Consider referral to an allergy specialist if angioedema recurs.  3. Essential hypertension Blood pressure well-controlled on amlodipine  after discontinuation of ACE inhibitor due to angioedema. Long history of difficult-to-control hypertension. Discussed potential need for ARB therapy for renal and cardiac protection if blood pressure control becomes necessary. - Monitor blood pressure at home and report readings before running out of amlodipine . - Consider adding an ARB like losartan if blood pressure control is needed. With his past med hx ARB may be beneficial to get on Will do close f/up and recheck in a few weeks to determine this BP Readings from Last 3 Encounters:  05/27/24 118/70  05/23/24 139/60  05/12/24 132/70     4. Anemia, unspecified type - Fecal Globin By Immunochemistry Hemoglobin  Date Value Ref Range Status  05/12/2024 10.5 (L) 13.2 - 17.1 g/dL Final  97/71/7974 88.5 (L) 13.2 - 17.1 g/dL Final  97/79/7975 87.9 (L) 13.2 - 17.1 g/dL Final  88/82/7976 88.5 (L) 13.2 - 17.1 g/dL Final  91/96/7983 89.1 (L) 12.6 - 17.7 g/dL Final   HGB  Date Value Ref Range Status  11/03/2013 12.2 (L) 13.0 - 18.0 g/dL Final   Recent drop in hemoglobin from 11.4 to 10.5 over six months. No reported bleeding or indigestion. Previous stool cards negative for blood loss. Possible GI blood loss or other causes considered. Discussed need for further  evaluation to rule out GI blood loss and other potential causes.  Pt denies any hematuria, melena, hematochezia, bruising, bleeding - Perform stool test for occult blood using a one-time mail-in card because he wishes to not do the home stool cards x 3 which were previously ordered along with f/up CBC in 1 month - Recheck CBC - Evaluate for potential GI blood loss if anemia persists  5.  Stage 3 chronic kidney disease Chronic kidney disease stage 3. ACE inhibitors previously used for renal protection, but discontinued due to angioedema. Discussed potential benefit of ARB therapy for renal protection if blood pressure control is needed. - Consider ARB for renal protection if blood pressure control is needed and no further angioedema occurs.      Recording duration: 19 minutes        Michelene Cower, PA-C 05/27/24 11:52 AM

## 2024-05-31 ENCOUNTER — Ambulatory Visit (INDEPENDENT_AMBULATORY_CARE_PROVIDER_SITE_OTHER): Payer: Medicare HMO | Admitting: Vascular Surgery

## 2024-05-31 ENCOUNTER — Encounter (INDEPENDENT_AMBULATORY_CARE_PROVIDER_SITE_OTHER): Payer: Self-pay | Admitting: Vascular Surgery

## 2024-05-31 ENCOUNTER — Ambulatory Visit (INDEPENDENT_AMBULATORY_CARE_PROVIDER_SITE_OTHER): Payer: Medicare HMO

## 2024-05-31 VITALS — BP 138/86 | HR 62 | Resp 16 | Wt 223.4 lb

## 2024-05-31 DIAGNOSIS — I70213 Atherosclerosis of native arteries of extremities with intermittent claudication, bilateral legs: Secondary | ICD-10-CM

## 2024-05-31 DIAGNOSIS — N183 Chronic kidney disease, stage 3 unspecified: Secondary | ICD-10-CM

## 2024-05-31 DIAGNOSIS — E785 Hyperlipidemia, unspecified: Secondary | ICD-10-CM

## 2024-05-31 DIAGNOSIS — I10 Essential (primary) hypertension: Secondary | ICD-10-CM

## 2024-05-31 NOTE — Progress Notes (Unsigned)
 MRN : [id number]  Todd Hutchinson is a 73 y.o. (02/01/1951) male who presents with chief complaint of No chief complaint on file. SABRA  History of Present Illness: Patient returns today in follow up of ***  Current Outpatient Medications  Medication Sig Dispense Refill   allopurinol  (ZYLOPRIM ) 100 MG tablet TAKE 1 TABLET BY MOUTH EVERY DAY 90 tablet 1   amLODipine  (NORVASC ) 10 MG tablet Take 1 tablet (10 mg total) by mouth daily. 30 tablet 0   aspirin EC 81 MG tablet Take 81 mg by mouth daily.     bisoprolol  (ZEBETA ) 10 MG tablet TAKE 1 TABLET BY MOUTH EVERY DAY 90 tablet 1   chlorthalidone  (HYGROTON ) 50 MG tablet Take 1 tablet (50 mg total) by mouth 2 (two) times daily. 180 tablet 2   Cholecalciferol (VITAMIN D -3) 25 MCG (1000 UT) CAPS Take 1 capsule (1,000 Units total) by mouth daily. 90 capsule 1   diphenhydrAMINE  (BENADRYL  ALLERGY) 25 mg capsule Take 1 capsule (25 mg total) by mouth every 8 (eight) hours for 7 days. 21 capsule 0   eplerenone (INSPRA) 25 MG tablet Take 25 mg by mouth daily.     ezetimibe  (ZETIA ) 10 MG tablet Take 1 tablet (10 mg total) by mouth daily. 90 tablet 2   hydrALAZINE  (APRESOLINE ) 100 MG tablet Take 1 tablet (100 mg total) by mouth 2 (two) times daily. 180 tablet 3   Omega-3 Fatty Acids (FISH OIL) 1000 MG CAPS Take 1,000 mg by mouth daily.     No current facility-administered medications for this visit.    Past Medical History:  Diagnosis Date   Abnormal weight gain 03/13/2016   Arthritis    COPD (chronic obstructive pulmonary disease) (HCC)    Hx of malignant neoplasm of prostate 05/31/2012   Hx of tobacco use, presenting hazards to health 04/17/2016   Hypercholesterolemia 03/13/2016   Hyperlipidemia 2010   Hypertension    Hypertensive nephrosclerosis 01/27/2018   Prostate cancer (HCC)    Sleep apnea    uses CPAP    Past Surgical History:  Procedure Laterality Date   COLONOSCOPY N/A 12/24/2023   Procedure: COLONOSCOPY;  Surgeon: Jinny Carmine, MD;  Location: [facility] ENDOSCOPY;  Service: Endoscopy;  Laterality: N/A;   COLONOSCOPY WITH PROPOFOL  N/A 07/29/2018   Procedure: COLONOSCOPY WITH BIOPSIES;  Surgeon: Jinny Carmine, MD;  Location: [facility];  Service: Endoscopy;  Laterality: N/A;   POLYPECTOMY N/A 07/29/2018   Procedure: POLYPECTOMY;  Surgeon: Jinny Carmine, MD;  Location: [facility];  Service: Endoscopy;  Laterality: N/A;   POLYPECTOMY  12/24/2023   Procedure: POLYPECTOMY, INTESTINE;  Surgeon: Jinny Carmine, MD;  Location: [facility] ENDOSCOPY;  Service: Endoscopy;;   PROSTATE SURGERY       Social History   Tobacco Use   Smoking status: Former    Current packs/day: 0.00    Average packs/day: 1 pack/day for 40.0 years (40.0 ttl pk-yrs)    Types: Cigarettes    Start date: 11/1973    Quit date: 11/2013    Years since quitting: 10.5   Smokeless tobacco: Never  Vaping Use   Vaping status: Never Used  Substance Use Topics   Alcohol use: Yes    Alcohol/week: 16.0 standard drinks of alcohol    Types: 16 Shots of liquor per week    Comment: no more than 4 whiskey drinks a day; maybe 4 times day    Drug use: No      Family History  Problem Relation Age  of Onset   Hypertension Mother    Aneurysm Mother 60       brain   Hypertension Father    Heart attack Father    Hypertension Sister    Hypertension Brother    Heart failure Brother    Heart disease Paternal Grandfather    Hypertension Sister    Hypertension Brother    Cancer Brother        throat cancer   Kidney cancer Neg Hx    Kidney failure Neg Hx    Prostate cancer Neg Hx    Sickle cell anemia Neg Hx    Tuberculosis Neg Hx      Allergies  Allergen Reactions   Ace Inhibitors Anaphylaxis and Swelling    Angioedema    Atorvastatin Rash    REVIEW OF SYSTEMS (Negative unless checked)   Constitutional: [] Weight loss  [] Fever  [] Chills Cardiac: [] Chest pain   [] Chest pressure   [] Palpitations   [] Shortness of breath when laying flat    [] Shortness of breath at rest   [] Shortness of breath with exertion. Vascular:  [x] Pain in legs with walking   [] Pain in legs at rest   [] Pain in legs when laying flat   [] Claudication   [] Pain in feet when walking  [] Pain in feet at rest  [] Pain in feet when laying flat   [] History of DVT   [] Phlebitis   [x] Swelling in legs   [] Varicose veins   [] Non-healing ulcers Pulmonary:   [] Uses home oxygen   [] Productive cough   [] Hemoptysis   [] Wheeze  [x] COPD   [] Asthma Neurologic:  [] Dizziness  [] Blackouts   [] Seizures   [] History of stroke   [] History of TIA  [] Aphasia   [] Temporary blindness   [] Dysphagia   [] Weakness or numbness in arms   [] Weakness or numbness in legs Musculoskeletal:  [x] Arthritis   [] Joint swelling   [x] Joint pain   [] Low back pain Hematologic:  [] Easy bruising  [] Easy bleeding   [] Hypercoagulable state   [] Anemic   Gastrointestinal:  [] Blood in stool   [] Vomiting blood  [] Gastroesophageal reflux/heartburn   [] Abdominal pain Genitourinary:  [] Chronic kidney disease   [] Difficult urination  [] Frequent urination  [] Burning with urination   [] Hematuria Skin:  [] Rashes   [] Ulcers   [] Wounds Psychological:  [] History of anxiety   []  History of major depression.  Physical Examination  There were no vitals taken for this visit. Gen:  WD/WN, NAD Head: [place]/AT, No temporalis wasting. Ear/Nose/Throat: Hearing grossly intact, nares w/o erythema or drainage Eyes: Conjunctiva clear. Sclera non-icteric Neck: Supple.  Trachea midline Pulmonary:  Good air movement, no use of accessory muscles.  Cardiac: RRR, no JVD Vascular:  Vessel Right Left  Radial Palpable Palpable                          PT Palpable Palpable  DP Palpable Palpable   Gastrointestinal: soft, non-tender/non-distended. No guarding/reflex.  Musculoskeletal: M/S 5/5 throughout.  No deformity or atrophy. *** edema. Neurologic: Sensation grossly intact in extremities.  Symmetrical.  Speech is fluent.  Psychiatric:  Judgment intact, Mood & affect appropriate for pt's clinical situation. Dermatologic: No rashes or ulcers noted.  No cellulitis or open wounds.      Labs Recent Results (from the past 2160 hours)  Urinalysis, Complete     Status: Abnormal   Collection Time: 04/27/24  1:01 PM  Result Value Ref Range   Specific Gravity, UA <1.005 (L) 1.005 - 1.030  pH, UA 6.0 5.0 - 7.5   Color, UA Yellow Yellow   Appearance Ur Clear Clear   Leukocytes,UA Negative Negative   Protein,UA Negative Negative/Trace   Glucose, UA Negative Negative   Ketones, UA Negative Negative   RBC, UA Negative Negative   Bilirubin, UA Negative Negative   Urobilinogen, Ur 0.2 0.2 - 1.0 mg/dL   Nitrite, UA Negative Negative   Microscopic Examination Comment     Comment: Microscopic follows if indicated.   Microscopic Examination See below:     Comment: Microscopic was indicated and was performed.  Microscopic Examination     Status: Abnormal   Collection Time: 04/27/24  1:01 PM   Urine  Result Value Ref Range   WBC, UA 0-5 0 - 5 /hpf   RBC, Urine None seen 0 - 2 /hpf   Epithelial Cells (non renal) >10 (A) 0 - 10 /hpf   Bacteria, UA Few None seen/Few  HgB A1c     Status: None   Collection Time: 05/12/24 12:01 PM  Result Value Ref Range   Hgb A1c MFr Bld 5.3 <5.7 %    Comment: For the purpose of screening for the presence of diabetes: . <5.7%       Consistent with the absence of diabetes 5.7-6.4%    Consistent with increased risk for diabetes             (prediabetes) > or =6.5%  Consistent with diabetes . This assay result is consistent with a decreased risk of diabetes. . Currently, no consensus exists regarding use of hemoglobin A1c for diagnosis of diabetes in children. . According to American Diabetes Association (ADA) guidelines, hemoglobin A1c <7.0% represents optimal control in non-pregnant diabetic patients. Different metrics may apply to specific patient populations.  Standards of Medical  Care in Diabetes(ADA). .    Mean Plasma Glucose 105 mg/dL   eAG (mmol/L) 5.8 mmol/L  Comprehensive Metabolic Panel (CMET)     Status: Abnormal   Collection Time: 05/12/24 12:01 PM  Result Value Ref Range   Glucose, Bld 90 65 - 99 mg/dL    Comment: .            Fasting reference interval .    BUN 25 7 - 25 mg/dL   Creat 8.72 9.29 - 8.71 mg/dL   eGFR 60 > OR = 60 fO/fpw/8.26f7   BUN/Creatinine Ratio SEE NOTE: 6 - 22 (calc)    Comment:    Not Reported: BUN and Creatinine are within    reference range. .    Sodium 139 135 - 146 mmol/L   Potassium 4.5 3.5 - 5.3 mmol/L   Chloride 106 98 - 110 mmol/L   CO2 24 20 - 32 mmol/L   Calcium 9.7 8.6 - 10.3 mg/dL   Total Protein 7.4 6.1 - 8.1 g/dL   Albumin 4.2 3.6 - 5.1 g/dL   Globulin 3.2 1.9 - 3.7 g/dL (calc)   AG Ratio 1.3 1.0 - 2.5 (calc)   Total Bilirubin 0.4 0.2 - 1.2 mg/dL   Alkaline phosphatase (APISO) 31 (L) 35 - 144 U/L   AST 14 10 - 35 U/L   ALT 21 9 - 46 U/L  CBC with Differential/Platelet     Status: Abnormal   Collection Time: 05/12/24 12:01 PM  Result Value Ref Range   WBC 10.3 3.8 - 10.8 Thousand/uL   RBC 3.83 (L) 4.20 - 5.80 Million/uL   Hemoglobin 10.5 (L) 13.2 - 17.1 g/dL   HCT 66.6 (L) 61.4 -  50.0 %   MCV 86.9 80.0 - 100.0 fL   MCH 27.4 27.0 - 33.0 pg   MCHC 31.5 (L) 32.0 - 36.0 g/dL    Comment: For adults, a slight decrease in the calculated MCHC value (in the range of 30 to 32 g/dL) is most likely not clinically significant; however, it should be interpreted with caution in correlation with other red cell parameters and the patient's clinical condition.    RDW 15.2 (H) 11.0 - 15.0 %   Platelets 226 140 - 400 Thousand/uL   MPV 13.1 (H) 7.5 - 12.5 fL   Neutro Abs 7,643 1,500 - 7,800 cells/uL   Absolute Lymphocytes 1,494 850 - 3,900 cells/uL   Absolute Monocytes 845 200 - 950 cells/uL   Eosinophils Absolute 268 15 - 500 cells/uL   Basophils Absolute 52 0 - 200 cells/uL   Neutrophils Relative % 74.2 %    Total Lymphocyte 14.5 %   Monocytes Relative 8.2 %   Eosinophils Relative 2.6 %   Basophils Relative 0.5 %  Uric acid     Status: None   Collection Time: 05/12/24 12:01 PM  Result Value Ref Range   Uric Acid, Serum 6.6 4.0 - 8.0 mg/dL    Comment: Therapeutic target for gout patients: <6.0 mg/dL .     Radiology No results found.  Assessment/Plan  No problem-specific Assessment & Plan notes found for this encounter.  Benign essential hypertension blood pressure control important in reducing the progression of atherosclerotic disease. On appropriate oral medications.     Stage 3 chronic kidney disease (HCC) Hydrate and limit contrast if the patient needs any contrast requiring procedures for revascularization.   Hyperlipidemia lipid control important in reducing the progression of atherosclerotic disease.    Selinda Gu, MD  05/31/2024 1:16 PM    This note was created with Dragon medical transcription system.  Any errors from dictation are purely unintentional

## 2024-06-01 LAB — VAS US ABI WITH/WO TBI
Left ABI: 0.93
Right ABI: 0.97

## 2024-06-01 NOTE — Assessment & Plan Note (Signed)
 His ABIs today are slightly improved up to 0.97 on the right and 0.93 on the left.  Claudication symptoms have improved over time with exercise, avoidance of tobacco, and medical risk factor treatment.  No role for intervention.  Continue to follow on an annual basis with noninvasive studies.

## 2024-06-02 ENCOUNTER — Ambulatory Visit: Payer: Self-pay | Admitting: Family Medicine

## 2024-06-02 LAB — FECAL GLOBIN BY IMMUNOCHEMISTRY
FECAL GLOBIN RESULT:: NOT DETECTED
MICRO NUMBER:: 16979736
SPECIMEN QUALITY:: ADEQUATE

## 2024-06-07 ENCOUNTER — Other Ambulatory Visit
Admission: RE | Admit: 2024-06-07 | Discharge: 2024-06-07 | Disposition: A | Discharge status: Home / Self Care | Attending: Urology | Admitting: Urology

## 2024-06-07 DIAGNOSIS — C61 Malignant neoplasm of prostate: Secondary | ICD-10-CM | POA: Diagnosis present

## 2024-06-07 LAB — PSA: Prostatic Specific Antigen: 0.27 ng/mL (ref 0.00–4.00)

## 2024-06-14 ENCOUNTER — Ambulatory Visit: Payer: Self-pay | Admitting: Urology

## 2024-06-14 VITALS — BP 179/75 | HR 80 | Wt 227.0 lb

## 2024-06-14 DIAGNOSIS — C61 Malignant neoplasm of prostate: Secondary | ICD-10-CM

## 2024-06-14 NOTE — Progress Notes (Signed)
 Todd Hutchinson                                          MRN: [id number]   06/14/2024   The [facility] Quality Team Specialist reviewed this patient medical record for the purposes of chart review for care gap closure. The following were reviewed: chart review for care gap closure-kidney health evaluation for diabetes:eGFR  and uACR.    [facility] Quality Team

## 2024-06-14 NOTE — Progress Notes (Signed)
   06/14/2024 10:03 AM   Todd Hutchinson 04-Dec-1950 [id number]  Reason for visit: Follow up history of prostate cancer, incontinence  History: Open radical prostatectomy in 2000 by Dr. Ike, favorable intermediate risk disease, salvage radiation 2006 for biochemical recurrence Had been on ADT long-term, but was stopped in 2020 and has remained low since that time.  PSA has been stable ranging from 0.1-0.6 Mild to moderate incontinence since radical prostatectomy History of gross hematuria June 2025, however culture was ultimately positive for E. coli and resolved with antibiotics, repeat urinalysis showed no microscopic hematuria  Physical Exam: BP (!) 179/75 (BP Location: Left Arm, Patient Position: Sitting, Cuff Size: Normal)   Pulse 80   Wt 227 lb (103 kg)   SpO2 97%   BMI 35.55 kg/m    Imaging/labs: September 2025: PSA normal 0.27 and stable, urinalysis benign  Today: No recurrent episodes of gross hematuria, incontinence seems to improve improved slightly Overall doing well no significant complaints  Plan:   Prostate cancer: PSA remains low and stable ranging from 0.1-0.6, defers further ADT or other treatments at this time and prefers PSA monitoring which is reasonable Incontinence: Improving, information given for Dr. Andrena in [place] to discuss surgical options if interested Hematuria: Associated with UTI, repeat urinalysis benign RTC 1 year PSA prior   Todd JAYSON Burnet, MD  [facility]rology [address] [place], [place] [address] [phone]

## 2024-06-14 NOTE — Patient Instructions (Signed)
 Dr. Herlene Foot is a urologist in [place] that performs procedures for urinary leakage after prostate cancer.  Their number is [phone] to schedule an appointment.  His website is [email or web address]

## 2024-06-15 ENCOUNTER — Other Ambulatory Visit: Payer: Self-pay

## 2024-06-15 ENCOUNTER — Ambulatory Visit (INDEPENDENT_AMBULATORY_CARE_PROVIDER_SITE_OTHER): Admitting: Internal Medicine

## 2024-06-15 ENCOUNTER — Encounter: Payer: Self-pay | Admitting: Internal Medicine

## 2024-06-15 VITALS — BP 152/74 | HR 96 | Temp 98.6°F | Resp 16 | Ht 67.0 in | Wt 226.6 lb

## 2024-06-15 DIAGNOSIS — D649 Anemia, unspecified: Secondary | ICD-10-CM

## 2024-06-15 DIAGNOSIS — Z23 Encounter for immunization: Secondary | ICD-10-CM

## 2024-06-15 DIAGNOSIS — I10 Essential (primary) hypertension: Secondary | ICD-10-CM

## 2024-06-15 MED ORDER — AMLODIPINE BESYLATE 10 MG PO TABS: 10.0000 mg | ORAL_TABLET | Freq: Every day | ORAL | 1 refills | Status: AC

## 2024-06-15 MED ORDER — TELMISARTAN 20 MG PO TABS: 20.0000 mg | ORAL_TABLET | Freq: Every day | ORAL | 1 refills | Status: DC

## 2024-06-15 NOTE — Patient Instructions (Addendum)
 It was great seeing you today!  Plan discussed at today's visit: -Blood work ordered today, results will be uploaded to MyChart.  -Start new blood pressure medication, Telmisartan 20 mg once daily. Continue to monitor blood pressure at home, if no change/still high after 2 weeks please call me so we can adjust the dose.  -Flu vaccine given today.   Follow up in: 1 month  Take care and let us  know if you have any questions or concerns prior to your next visit.  Dr. Bernardo   Telmisartan Tablets What is this medication? TELMISARTAN (tel mi SAR tan) treats high blood pressure. It may also be used to reduce the risk of heart attack and stroke. It works by relaxing blood vessels, which decreases the amount of work the heart has to do. It belongs to a group of medications called ARBs. This medicine may be used for other purposes; ask your health care provider or pharmacist if you have questions. COMMON BRAND NAME(S): Micardis What should I tell my care team before I take this medication? They need to know if you have any of these conditions: Diet low in salt Kidney disease Liver disease Low levels of sodium in the blood An unusual or allergic reaction to telmisartan, other medications, foods, dyes, or preservatives Pregnant or trying to get pregnant Breast-feeding How should I use this medication? Take this medication by mouth. Take it as directed on the prescription label at the same time every day. You can take it with or without food. If it upsets your stomach, take it with food. Keep taking it unless your care team tells you to stop. Talk to your care team about the use of this medication in children. Special care may be needed. Overdosage: If you think you have taken too much of this medicine contact a poison control center or emergency room at once. NOTE: This medicine is only for you. Do not share this medicine with others. What if I miss a dose? If you miss a dose, take it as soon  as you can. If it is almost time for your next dose, take only that dose. Do not take double or extra doses. What may interact with this medication? This medication may interact with the following: Certain medications for blood pressure, heart disease, irregular heart beat Diuretics Lithium NSAIDs, medications for pain and inflammation, like ibuprofen or naproxen This list may not describe all possible interactions. Give your health care provider a list of all the medicines, herbs, non-prescription drugs, or dietary supplements you use. Also tell them if you smoke, drink alcohol, or use illegal drugs. Some items may interact with your medicine. What should I watch for while using this medication? Visit your care team for regular checks on your progress. Check your blood pressure as directed. Ask your care team what your blood pressure should be. Also find out when you should contact them. Women should inform their care team if they wish to become pregnant or think they might be pregnant. There is a potential for serious side effects and harm to an unborn child, particularly in the second or third trimester. Talk to your care team for more information. You may get dizzy. Do not drive, use machinery, or do anything that needs mental alertness until you know how this medication affects you. Do not stand or sit up quickly, especially if you are an older patient. This reduces the risk of dizzy or fainting spells. Avoid alcoholic drinks; they can make you more  dizzy. Avoid salt substitutes unless you are told otherwise by your care team. Do not treat yourself for coughs, colds, or pain while you are taking this medication without asking your care team for advice. Some medications may increase your blood pressure. What side effects may I notice from receiving this medication? Side effects that you should report to your care team as soon as possible: Allergic reactions--skin rash, itching, hives, swelling of  the face, lips, tongue, or throat High potassium level--muscle weakness, fast or irregular heartbeat Kidney injury--decrease in the amount of urine, swelling of the ankles, hands, or feet Low blood pressure--dizziness, feeling faint or lightheaded, blurry vision Side effects that usually do not require medical attention (report to your care team if they continue or are bothersome): Back pain Diarrhea Dizziness Fatigue Headache Sinus pain or pressure around the face or forehead This list may not describe all possible side effects. Call your doctor for medical advice about side effects. You may report side effects to FDA at 1-800-FDA-1088. Where should I keep my medication? Keep out of the reach of children and pets. Store at room temperature between 15 and 30 degrees C (59 and 86 degrees F). Keep this medication in the original packaging until you are ready to take it. Throw away any unused medication after the expiration date. NOTE: This sheet is a summary. It may not cover all possible information. If you have questions about this medicine, talk to your doctor, pharmacist, or health care provider.  2024 Elsevier/Gold Standard (2021-09-26 00:00:00)

## 2024-06-15 NOTE — Progress Notes (Signed)
 Established Patient Office Visit  Subjective   Patient ID: Todd Hutchinson, male    DOB: 22-Mar-1951  Age: 73 y.o. MRN: [id number]  Chief Complaint  Patient presents with   Hypertension    Hypertension Pertinent negatives include no chest pain, headaches or shortness of breath.    Patient is here to follow up on blood pressure and anemia. This is my first time meeting him. He is here with his wife.   Discussed the use of AI scribe software for clinical note transcription with the patient, who gave verbal consent to proceed.  History of Present Illness Todd Hutchinson is a 73 year old male who presents for follow-up after an allergic reaction to blood pressure medication.  He was recently hospitalized due to an allergic reaction to lisinopril, which he had been taking for years without issue. Since discontinuing lisinopril, his blood pressure has been increasing, with home measurements showing a trend from 140/77 to 161/66, and a reading of 179 at a recent urology appointment. His current medications for hypertension include amlodipine  10 mg once daily, bisoprolol  10 mg once daily, chlorthalidone  50 mg twice daily, hydralazine  100 mg twice daily, and eplerenone as prescribed.  He has prostate cancer treated with radiation, which has caused some hematuria. No other abnormal bleeding such as in stools or urine recently. He underwent a colonoscopy in April, which showed two polyps that were removed and were non-cancerous.  He has been experiencing a drop in hemoglobin levels, noted to be 10.5 during his hospital stay. No significant symptoms such as fatigue or lightheadedness, although he mentions sitting too much on the couch. He has a family history of anemia, as his sister has it, but he is unsure of the type. Stool tests were negative for blood, and he had a colonoscopy in September with removal of two polyps.   Hypertension: -Medications: Amlodipine  10 mg, Bisoprolol  10  mg, Chlorthalidone  50 mg BID, Hydralazine  100 mg BID, Eplerenone 25 mg -Patient is compliant with above medications and reports no side effects. -Patient recently inpatient after an episode of angioedema from ACEI which was discontinued  -Checking BP at home (average): 140-166/60-70 -Denies any SOB, CP, vision changes, LE edema or symptoms of hypotension     Review of Systems  Respiratory:  Negative for shortness of breath.   Cardiovascular:  Negative for chest pain.  Neurological:  Negative for dizziness and headaches.      Objective:     BP (!) 152/74 (Cuff Size: Large)   Pulse 96   Temp 98.6 F (37 C) (Oral)   Resp 16   Ht 5' 7 (1.702 m)   Wt 226 lb 9.6 oz (102.8 kg)   SpO2 97%   BMI 35.49 kg/m  BP Readings from Last 3 Encounters:  06/15/24 (!) 152/74  06/14/24 (!) 179/75  05/31/24 138/86   Wt Readings from Last 3 Encounters:  06/15/24 226 lb 9.6 oz (102.8 kg)  06/14/24 227 lb (103 kg)  05/31/24 223 lb 6.4 oz (101.3 kg)      Physical Exam Constitutional:      Appearance: Normal appearance.  HENT:     Head: Normocephalic and atraumatic.  Eyes:     Conjunctiva/sclera: Conjunctivae normal.  Cardiovascular:     Rate and Rhythm: Normal rate and regular rhythm.  Pulmonary:     Effort: Pulmonary effort is normal.     Breath sounds: Normal breath sounds.  Musculoskeletal:     Right lower leg: Edema present.  Left lower leg: Edema present.     Comments: Mild chronic non-pitting edema  Skin:    General: Skin is warm and dry.  Neurological:     General: No focal deficit present.     Mental Status: He is alert. Mental status is at baseline.  Psychiatric:        Mood and Affect: Mood normal.        Behavior: Behavior normal.      No results found for any visits on 06/15/24.  Last CBC Lab Results  Component Value Date   WBC 10.3 05/12/2024   HGB 10.5 (L) 05/12/2024   HCT 33.3 (L) 05/12/2024   MCV 86.9 05/12/2024   MCH 27.4 05/12/2024   RDW 15.2  (H) 05/12/2024   PLT 226 05/12/2024   Last metabolic panel Lab Results  Component Value Date   GLUCOSE 90 05/12/2024   NA 139 05/12/2024   K 4.5 05/12/2024   CL 106 05/12/2024   CO2 24 05/12/2024   BUN 25 05/12/2024   CREATININE 1.27 05/12/2024   EGFR 60 05/12/2024   CALCIUM 9.7 05/12/2024   PROT 7.4 05/12/2024   ALBUMIN 4.4 04/15/2017   LABGLOB 2.4 06/18/2015   AGRATIO 2.0 06/18/2015   BILITOT 0.4 05/12/2024   ALKPHOS 36 (L) 04/15/2017   AST 14 05/12/2024   ALT 21 05/12/2024   ANIONGAP 12 12/17/2022   Last lipids Lab Results  Component Value Date   CHOL 170 11/13/2023   HDL 78 11/13/2023   LDLCALC 75 11/13/2023   TRIG 89 11/13/2023   CHOLHDL 2.2 11/13/2023   Last hemoglobin A1c Lab Results  Component Value Date   HGBA1C 5.3 05/12/2024   Last thyroid functions Lab Results  Component Value Date   TSH 1.67 10/11/2020   Last vitamin D  Lab Results  Component Value Date   VD25OH 37 11/13/2023   Last vitamin B12 and Folate Lab Results  Component Value Date   VITAMINB12 351 11/12/2021   FOLATE 8.7 11/12/2021      The 10-year ASCVD risk score (Arnett DK, et al., 2019) is: 39.2%    Assessment & Plan:   Assessment & Plan Hypertension with recent allergic reaction to ACE inhibitor Hypertension poorly controlled. Recent allergic reaction to lisinopril necessitates alternative treatment. Telmisartan chosen for its lower risk of angioedema and insurance coverage. - Start telmisartan 20 mg once daily. - Monitor blood pressure at home. - Instruct to call if blood pressure does not improve after two weeks. - Schedule follow-up appointment in one month to reassess blood pressure. - Provide information on telmisartan.  Anemia, unspecified Decreasing hemoglobin levels with no obvious blood loss. Differential includes iron deficiency, vitamin B12 deficiency, and autoimmune anemia. Recent tests negative for occult blood and malignancy. - Order labs to check iron  levels, vitamin B12, and other markers for anemia. - Recheck hemoglobin levels.  General Health Maintenance - Administer flu shot.  - amLODipine  (NORVASC ) 10 MG tablet; Take 1 tablet (10 mg total) by mouth daily.  Dispense: 90 tablet; Refill: 1 - telmisartan (MICARDIS) 20 MG tablet; Take 1 tablet (20 mg total) by mouth daily.  Dispense: 30 tablet; Refill: 1 - CBC w/Diff/Platelet - Fe+TIBC+Fer - B12 and Folate Panel - Lactate dehydrogenase; Future - Haptoglobin - Flu vaccine HIGH DOSE PF(Fluzone Trivalent)   Return in about 4 weeks (around 07/13/2024).    Sharyle Fischer, DO

## 2024-06-16 ENCOUNTER — Ambulatory Visit: Payer: Self-pay | Admitting: Internal Medicine

## 2024-06-16 DIAGNOSIS — I10 Essential (primary) hypertension: Secondary | ICD-10-CM | POA: Diagnosis not present

## 2024-06-16 DIAGNOSIS — G4733 Obstructive sleep apnea (adult) (pediatric): Secondary | ICD-10-CM | POA: Diagnosis not present

## 2024-06-16 DIAGNOSIS — E669 Obesity, unspecified: Secondary | ICD-10-CM | POA: Diagnosis not present

## 2024-06-16 LAB — IRON,TIBC AND FERRITIN PANEL
%SAT: 15 % — ABNORMAL LOW (ref 20–48)
Ferritin: 28 ng/mL (ref 24–380)
Iron: 44 ug/dL — ABNORMAL LOW (ref 50–180)
TIBC: 290 ug/dL (ref 250–425)

## 2024-06-16 LAB — CBC WITH DIFFERENTIAL/PLATELET
Absolute Lymphocytes: 1456 {cells}/uL (ref 850–3900)
Absolute Monocytes: 728 {cells}/uL (ref 200–950)
Basophils Absolute: 36 {cells}/uL (ref 0–200)
Basophils Relative: 0.4 %
Eosinophils Absolute: 209 {cells}/uL (ref 15–500)
Eosinophils Relative: 2.3 %
HCT: 36.3 % — ABNORMAL LOW (ref 38.5–50.0)
Hemoglobin: 11.6 g/dL — ABNORMAL LOW (ref 13.2–17.1)
MCH: 27.8 pg (ref 27.0–33.0)
MCHC: 32 g/dL (ref 32.0–36.0)
MCV: 86.8 fL (ref 80.0–100.0)
MPV: 13.2 fL — ABNORMAL HIGH (ref 7.5–12.5)
Monocytes Relative: 8 %
Neutro Abs: 6670 {cells}/uL (ref 1500–7800)
Neutrophils Relative %: 73.3 %
Platelets: 190 Thousand/uL (ref 140–400)
RBC: 4.18 Million/uL — ABNORMAL LOW (ref 4.20–5.80)
RDW: 14.3 % (ref 11.0–15.0)
Total Lymphocyte: 16 %
WBC: 9.1 Thousand/uL (ref 3.8–10.8)

## 2024-06-16 LAB — B12 AND FOLATE PANEL
Folate: 8.7 ng/mL
Vitamin B-12: 302 pg/mL (ref 200–1100)

## 2024-06-16 LAB — HAPTOGLOBIN: Haptoglobin: 124 mg/dL (ref 43–212)

## 2024-06-16 LAB — EXTRA

## 2024-07-07 ENCOUNTER — Other Ambulatory Visit: Payer: Self-pay | Admitting: Internal Medicine

## 2024-07-07 DIAGNOSIS — I10 Essential (primary) hypertension: Secondary | ICD-10-CM

## 2024-07-08 NOTE — Telephone Encounter (Signed)
 Requested medication (s) are due for refill today: No  Requested medication (s) are on the active medication list: Yes  Last refill:  06/15/24  Future visit scheduled: Yes  Notes to clinic:  See pharmacy requests.    Requested Prescriptions  Pending Prescriptions Disp Refills   telmisartan (MICARDIS) 20 MG tablet [Pharmacy Med Name: TELMISARTAN 20 MG TABLET] 90 tablet 1    Sig: TAKE 1 TABLET BY MOUTH EVERY DAY     Cardiovascular:  Angiotensin Receptor Blockers Failed - 07/08/2024  3:35 PM      Failed - Last BP in normal range    BP Readings from Last 1 Encounters:  06/15/24 (!) 152/74         Passed - Cr in normal range and within 180 days    Creat  Date Value Ref Range Status  05/12/2024 1.27 0.70 - 1.28 mg/dL Final   Creatinine, Urine  Date Value Ref Range Status  07/29/2017 88 20 - 320 mg/dL Final         Passed - K in normal range and within 180 days    Potassium  Date Value Ref Range Status  05/12/2024 4.5 3.5 - 5.3 mmol/L Final         Passed - Patient is not pregnant      Passed - Valid encounter within last 6 months    Recent Outpatient Visits           3 weeks ago Essential hypertension   [facility] Bernardo Fend, DO   1 month ago Hospital discharge follow-up   [facility] Leavy Mole, PA-C   1 month ago Benign essential hypertension   [facility] Leavy Mole, PA-C   7 months ago Well adult exam   [facility] Leavy Mole, PA-C       Future Appointments             In 4 months Leavy Mole, PA-C [facility] [place]   In 11 months Francisca, Redell BROCKS, MD [facility] [place]

## 2024-07-09 ENCOUNTER — Other Ambulatory Visit: Payer: Self-pay | Admitting: Cardiology

## 2024-07-13 ENCOUNTER — Encounter: Payer: Self-pay | Admitting: Internal Medicine

## 2024-07-13 ENCOUNTER — Other Ambulatory Visit: Payer: Self-pay

## 2024-07-13 ENCOUNTER — Ambulatory Visit: Admitting: Internal Medicine

## 2024-07-13 VITALS — BP 132/78 | HR 85 | Temp 98.3°F | Resp 16 | Ht 67.0 in | Wt 226.5 lb

## 2024-07-13 DIAGNOSIS — I10 Essential (primary) hypertension: Secondary | ICD-10-CM | POA: Diagnosis not present

## 2024-07-13 DIAGNOSIS — Z23 Encounter for immunization: Secondary | ICD-10-CM

## 2024-07-13 MED ORDER — TELMISARTAN 20 MG PO TABS: 20.0000 mg | ORAL_TABLET | Freq: Every day | ORAL | 1 refills | Status: AC

## 2024-07-13 NOTE — Progress Notes (Signed)
 Established Patient Office Visit  Subjective   Patient ID: Todd Hutchinson, male    DOB: 10/25/1950  Age: 73 y.o. MRN: [id number]  Chief Complaint  Patient presents with   Hypertension    Hypertension    Patient is here to follow up on blood pressure. He is here with his wife.   Discussed the use of AI scribe software for clinical note transcription with the patient, who gave verbal consent to proceed.  History of Present Illness  Todd Hutchinson is a 73 year old male with hypertension who presents for follow-up of his blood pressure management.  His blood pressure has improved since starting telmisartan, initially reading as high as 150 mmHg and now ranging from 130-135 mmHg, with occasional readings as low as 122/66 mmHg. He is currently on telmisartan and amlodipine  and needs a refill for telmisartan.  He has kidney issues and consults a kidney specialist, with noted improvement in kidney function in August. He also sees a cardiologist and a urologist.   Hypertension: -Medications: Amlodipine  10 mg, Bisoprolol  10 mg, Chlorthalidone  50 mg BID, Hydralazine  100 mg BID, Eplerenone 25 mg. Added Telmisartan 20 mg at LOV -Patient is compliant with above medications and reports no side effects. -Patient recently inpatient after an episode of angioedema from ACEI which was discontinued  -Checking BP at home (average): 133-155/60-74 -Denies any SOB, CP, vision changes, LE edema or symptoms of hypotension    Review of Systems  All other systems reviewed and are negative.     Objective:     BP 132/78 (Cuff Size: Large)   Pulse 85   Temp 98.3 F (36.8 C) (Oral)   Resp 16   Ht 5' 7 (1.702 m)   Wt 226 lb 8 oz (102.7 kg)   SpO2 99%   BMI 35.47 kg/m  BP Readings from Last 3 Encounters:  06/15/24 (!) 152/74  06/14/24 (!) 179/75  05/31/24 138/86   Wt Readings from Last 3 Encounters:  06/15/24 226 lb 9.6 oz (102.8 kg)  06/14/24 227 lb (103 kg)  05/31/24  223 lb 6.4 oz (101.3 kg)      Physical Exam Constitutional:      Appearance: Normal appearance.  HENT:     Head: Normocephalic and atraumatic.  Eyes:     Conjunctiva/sclera: Conjunctivae normal.  Cardiovascular:     Rate and Rhythm: Normal rate and regular rhythm.  Pulmonary:     Effort: Pulmonary effort is normal.     Breath sounds: Normal breath sounds.  Skin:    General: Skin is warm and dry.  Neurological:     General: No focal deficit present.     Mental Status: He is alert. Mental status is at baseline.  Psychiatric:        Mood and Affect: Mood normal.        Behavior: Behavior normal.      No results found for any visits on 07/13/24.  Last CBC Lab Results  Component Value Date   WBC 9.1 06/15/2024   HGB 11.6 (L) 06/15/2024   HCT 36.3 (L) 06/15/2024   MCV 86.8 06/15/2024   MCH 27.8 06/15/2024   RDW 14.3 06/15/2024   PLT 190 06/15/2024   Last metabolic panel Lab Results  Component Value Date   GLUCOSE 90 05/12/2024   NA 139 05/12/2024   K 4.5 05/12/2024   CL 106 05/12/2024   CO2 24 05/12/2024   BUN 25 05/12/2024   CREATININE 1.27 05/12/2024   EGFR  60 05/12/2024   CALCIUM 9.7 05/12/2024   PROT 7.4 05/12/2024   ALBUMIN 4.4 04/15/2017   LABGLOB 2.4 06/18/2015   AGRATIO 2.0 06/18/2015   BILITOT 0.4 05/12/2024   ALKPHOS 36 (L) 04/15/2017   AST 14 05/12/2024   ALT 21 05/12/2024   ANIONGAP 12 12/17/2022   Last lipids Lab Results  Component Value Date   CHOL 170 11/13/2023   HDL 78 11/13/2023   LDLCALC 75 11/13/2023   TRIG 89 11/13/2023   CHOLHDL 2.2 11/13/2023   Last hemoglobin A1c Lab Results  Component Value Date   HGBA1C 5.3 05/12/2024   Last thyroid functions Lab Results  Component Value Date   TSH 1.67 10/11/2020   Last vitamin D  Lab Results  Component Value Date   VD25OH 37 11/13/2023   Last vitamin B12 and Folate Lab Results  Component Value Date   VITAMINB12 302 06/15/2024   FOLATE 8.7 06/15/2024      The 10-year  ASCVD risk score (Arnett DK, et al., 2019) is: 38.4%    Assessment & Plan:   Assessment & Plan  Hypertension Hypertension is well-controlled with telmisartan, with recent blood pressure readings of 132/78 mmHg and as low as 122/66 mmHg. - Continue telmisartan and send a 66-month prescription to [facility] [place]. - Recheck kidney function in a couple of months, coordinated with nephrologist.  General Health Maintenance General health maintenance is up to date with recent colonoscopy and flu vaccination. Pneumonia vaccination is due for an update. - Administer updated pneumonia vaccine today.  - telmisartan (MICARDIS) 20 MG tablet; Take 1 tablet (20 mg total) by mouth daily.  Dispense: 90 tablet; Refill: 1 - Pneumococcal conjugate vaccine 20-valent (Prevnar 20)   Return for please switch March appointment to me for CPE.    Sharyle Fischer, DO

## 2024-07-26 DIAGNOSIS — N1831 Chronic kidney disease, stage 3a: Secondary | ICD-10-CM | POA: Diagnosis not present

## 2024-07-26 DIAGNOSIS — G4733 Obstructive sleep apnea (adult) (pediatric): Secondary | ICD-10-CM | POA: Diagnosis not present

## 2024-07-26 DIAGNOSIS — R319 Hematuria, unspecified: Secondary | ICD-10-CM | POA: Diagnosis not present

## 2024-07-26 DIAGNOSIS — D631 Anemia in chronic kidney disease: Secondary | ICD-10-CM | POA: Diagnosis not present

## 2024-07-26 DIAGNOSIS — I10 Essential (primary) hypertension: Secondary | ICD-10-CM | POA: Diagnosis not present

## 2024-08-02 DIAGNOSIS — E785 Hyperlipidemia, unspecified: Secondary | ICD-10-CM | POA: Diagnosis not present

## 2024-08-02 DIAGNOSIS — I10 Essential (primary) hypertension: Secondary | ICD-10-CM | POA: Diagnosis not present

## 2024-08-02 DIAGNOSIS — R319 Hematuria, unspecified: Secondary | ICD-10-CM | POA: Diagnosis not present

## 2024-08-02 DIAGNOSIS — N1831 Chronic kidney disease, stage 3a: Secondary | ICD-10-CM | POA: Diagnosis not present

## 2024-08-26 NOTE — Progress Notes (Signed)
 Natan Hartog                                          MRN: [id number]   08/26/2024   The VBCI Quality Team Specialist reviewed this patient medical record for the purposes of chart review for care gap closure. The following were reviewed: chart review for care gap closure-kidney health evaluation for diabetes:eGFR  and uACR.    VBCI Quality Team

## 2024-09-09 ENCOUNTER — Other Ambulatory Visit: Payer: Self-pay | Admitting: Cardiology

## 2024-09-09 DIAGNOSIS — I10 Essential (primary) hypertension: Secondary | ICD-10-CM

## 2024-09-23 NOTE — Progress Notes (Signed)
 Stclair Szymborski                                          MRN: [id number]   09/23/2024   The VBCI Quality Team Specialist reviewed this patient medical record for the purposes of chart review for care gap closure. The following were reviewed: chart review for care gap closure-kidney health evaluation for diabetes:eGFR  and uACR.    VBCI Quality Team

## 2024-09-28 NOTE — Progress Notes (Signed)
 Branndon Tuite                                          MRN: [id number]   09/28/2024   The VBCI Quality Team Specialist reviewed this patient medical record for the purposes of chart review for care gap closure. The following were reviewed: chart review for care gap closure-controlling blood pressure.    VBCI Quality Team

## 2024-10-08 ENCOUNTER — Other Ambulatory Visit: Payer: Self-pay | Admitting: Cardiology

## 2024-10-20 ENCOUNTER — Other Ambulatory Visit: Payer: Self-pay | Admitting: Internal Medicine

## 2024-10-20 DIAGNOSIS — M1A0790 Idiopathic chronic gout, unspecified ankle and foot, without tophus (tophi): Secondary | ICD-10-CM

## 2024-10-20 NOTE — Telephone Encounter (Unsigned)
 Copied from CRM [id number]. Topic: Clinical - Medication Refill >> Oct 20, 2024  1:40 PM Donna E wrote: Medication:  allopurinol  (ZYLOPRIM ) 100 MG tablet  Has the patient contacted their pharmacy? No (Agent: If no, request that the patient contact the pharmacy for the refill. If patient does not wish to contact the pharmacy document the reason why and proceed with request.) (Agent: If yes, when and what did the pharmacy advise?)  This is the patient's preferred pharmacy:   [id number] - [place], [place] - [address] [place] [address] Phone: [phone] Fax: [phone]  Is this the correct pharmacy for this prescription? Yes If no, delete pharmacy and type the correct one.   Has the prescription been filled recently? No  Is the patient out of the medication? No  Has the patient been seen for an appointment in the last year OR does the patient have an upcoming appointment? Yes  Can we respond through MyChart? Yes  Agent: Please be advised that Rx refills may take up to 3 business days. We ask that you follow-up with your pharmacy.

## 2024-10-21 MED ORDER — ALLOPURINOL 100 MG PO TABS: 100.0000 mg | ORAL_TABLET | Freq: Every day | ORAL | 1 refills | Status: AC

## 2024-10-21 NOTE — Telephone Encounter (Signed)
 Requested Prescriptions  Pending Prescriptions Disp Refills   allopurinol  (ZYLOPRIM ) 100 MG tablet 90 tablet 1    Sig: Take 1 tablet (100 mg total) by mouth daily.     Endocrinology:  Gout Agents - allopurinol  Passed - 10/21/2024  3:25 PM      Passed - Uric Acid in normal range and within 360 days    Uric Acid, Serum  Date Value Ref Range Status  05/12/2024 6.6 4.0 - 8.0 mg/dL Final    Comment:    Therapeutic target for gout patients: <6.0 mg/dL .          Passed - Cr in normal range and within 360 days    Creat  Date Value Ref Range Status  05/12/2024 1.27 0.70 - 1.28 mg/dL Final   Creatinine, Urine  Date Value Ref Range Status  07/29/2017 88 20 - 320 mg/dL Final         Passed - Valid encounter within last 12 months    Recent Outpatient Visits           3 months ago Essential hypertension   [facility] Bernardo Fend, DO   4 months ago Essential hypertension    [facility] Bernardo Fend, DO   4 months ago Hospital discharge follow-up   [facility] Leavy Mole, PA-C   5 months ago Benign essential hypertension   [id number] Leavy Mole, PA-C   11 months ago Well adult exam   [facility] Leavy Mole, PA-C       Future Appointments             In 7 months Francisca, Redell BROCKS, MD [facility] [place]            Passed - CBC within normal limits and completed in the last 12 months    WBC  Date Value Ref Range Status  06/15/2024 9.1 3.8 - 10.8 Thousand/uL Final   RBC  Date Value Ref Range Status  06/15/2024 4.18 (L) 4.20 - 5.80 Million/uL Final   Hemoglobin  Date Value Ref Range Status  06/15/2024 11.6 (L) 13.2 - 17.1 g/dL Final  91/96/7983 89.1 (L) 12.6 - 17.7 g/dL Final   HCT  Date Value Ref Range Status  06/15/2024 36.3 (L) 38.5 - 50.0 % Final   Hematocrit  Date Value Ref Range Status  04/18/2015 33.0  (L) 37.5 - 51.0 % Final   MCHC  Date Value Ref Range Status  06/15/2024 32.0 32.0 - 36.0 g/dL Final    Comment:    For adults, a slight decrease in the calculated MCHC value (in the range of 30 to 32 g/dL) is most likely not clinically significant; however, it should be interpreted with caution in correlation with other red cell parameters and the patient's clinical condition.    [facility]  Date Value Ref Range Status  06/15/2024 27.8 27.0 - 33.0 pg Final   MCV  Date Value Ref Range Status  06/15/2024 86.8 80.0 - 100.0 fL Final  04/18/2015 78 (L) 79 - 97 fL Final  11/03/2013 78 (L) 80 - 100 fL Final   No results found for: PLTCOUNTKUC, LABPLAT, POCPLA RDW  Date Value Ref Range Status  06/15/2024 14.3 11.0 - 15.0 % Final  04/18/2015 16.1 (H) 12.3 - 15.4 % Final  11/03/2013 18.7 (H) 11.5 - 14.5 % Final

## 2024-11-15 ENCOUNTER — Encounter: Admitting: Family Medicine

## 2024-11-15 ENCOUNTER — Encounter: Admitting: Internal Medicine

## 2025-05-30 ENCOUNTER — Ambulatory Visit (INDEPENDENT_AMBULATORY_CARE_PROVIDER_SITE_OTHER): Admitting: Vascular Surgery

## 2025-05-30 ENCOUNTER — Encounter (INDEPENDENT_AMBULATORY_CARE_PROVIDER_SITE_OTHER)

## 2025-06-15 ENCOUNTER — Ambulatory Visit: Admitting: Urology
